# Patient Record
Sex: Female | Born: 1955 | Race: White | Hispanic: No | Marital: Married | Smoking: Never smoker
Health system: Southern US, Community
[De-identification: ages and names within clinical notes are randomized; demographics above are authoritative.]

## PROBLEM LIST (undated history)

## (undated) DIAGNOSIS — K449 Diaphragmatic hernia without obstruction or gangrene: Secondary | ICD-10-CM

## (undated) DIAGNOSIS — K222 Esophageal obstruction: Secondary | ICD-10-CM

## (undated) DIAGNOSIS — Z98891 History of uterine scar from previous surgery: Secondary | ICD-10-CM

## (undated) DIAGNOSIS — M722 Plantar fascial fibromatosis: Secondary | ICD-10-CM

## (undated) DIAGNOSIS — K5730 Diverticulosis of large intestine without perforation or abscess without bleeding: Secondary | ICD-10-CM

## (undated) DIAGNOSIS — Z79890 Hormone replacement therapy (postmenopausal): Secondary | ICD-10-CM

## (undated) DIAGNOSIS — H1130 Conjunctival hemorrhage, unspecified eye: Secondary | ICD-10-CM

## (undated) DIAGNOSIS — Z0000 Encounter for general adult medical examination without abnormal findings: Secondary | ICD-10-CM

## (undated) DIAGNOSIS — K219 Gastro-esophageal reflux disease without esophagitis: Secondary | ICD-10-CM

## (undated) DIAGNOSIS — H6120 Impacted cerumen, unspecified ear: Secondary | ICD-10-CM

## (undated) DIAGNOSIS — R87610 Atypical squamous cells of undetermined significance on cytologic smear of cervix (ASC-US): Secondary | ICD-10-CM

## (undated) DIAGNOSIS — D126 Benign neoplasm of colon, unspecified: Secondary | ICD-10-CM

## (undated) HISTORY — DX: Conjunctival hemorrhage, unspecified eye: H11.30

## (undated) HISTORY — PX: ETHMOIDECTOMY: SHX5197

## (undated) HISTORY — DX: Plantar fascial fibromatosis: M72.2

## (undated) HISTORY — DX: Esophageal obstruction: K22.2

## (undated) HISTORY — DX: Diverticulosis of large intestine without perforation or abscess without bleeding: K57.30

## (undated) HISTORY — DX: Diaphragmatic hernia without obstruction or gangrene: K44.9

## (undated) HISTORY — DX: Impacted cerumen, unspecified ear: H61.20

## (undated) HISTORY — DX: Benign neoplasm of colon, unspecified: D12.6

## (undated) HISTORY — PX: GALLBLADDER SURGERY: SHX652

## (undated) HISTORY — DX: Atypical squamous cells of undetermined significance on cytologic smear of cervix (ASC-US): R87.610

## (undated) HISTORY — DX: Gastro-esophageal reflux disease without esophagitis: K21.9

## (undated) HISTORY — DX: History of uterine scar from previous surgery: Z98.891

## (undated) HISTORY — DX: Hormone replacement therapy: Z79.890

## (undated) HISTORY — DX: Encounter for general adult medical examination without abnormal findings: Z00.00

---

## 1987-01-09 DIAGNOSIS — Z9889 Other specified postprocedural states: Secondary | ICD-10-CM

## 1987-01-09 HISTORY — PX: ETHMOIDECTOMY: SHX5197

## 1987-01-09 HISTORY — DX: Other specified postprocedural states: Z98.890

## 1998-08-09 LAB — FECAL OCCULT BLOOD, GUAIAC: Fecal Occult Blood: NEGATIVE

## 1998-08-24 ENCOUNTER — Other Ambulatory Visit: Admission: RE | Admit: 1998-08-24 | Discharge: 1998-08-24 | Payer: Self-pay | Admitting: Family Medicine

## 1998-09-14 ENCOUNTER — Encounter (INDEPENDENT_AMBULATORY_CARE_PROVIDER_SITE_OTHER): Payer: Self-pay

## 1998-09-14 ENCOUNTER — Other Ambulatory Visit: Admission: RE | Admit: 1998-09-14 | Discharge: 1998-09-14 | Payer: Self-pay | Admitting: Obstetrics & Gynecology

## 1999-08-16 ENCOUNTER — Other Ambulatory Visit: Admission: RE | Admit: 1999-08-16 | Discharge: 1999-08-16 | Payer: Self-pay | Admitting: Obstetrics & Gynecology

## 2001-02-06 ENCOUNTER — Other Ambulatory Visit: Admission: RE | Admit: 2001-02-06 | Discharge: 2001-02-06 | Payer: Self-pay | Admitting: Family Medicine

## 2002-06-15 ENCOUNTER — Other Ambulatory Visit: Admission: RE | Admit: 2002-06-15 | Discharge: 2002-06-15 | Payer: Self-pay | Admitting: Family Medicine

## 2002-06-23 ENCOUNTER — Encounter: Payer: Self-pay | Admitting: Family Medicine

## 2002-06-23 ENCOUNTER — Ambulatory Visit (HOSPITAL_COMMUNITY): Admission: RE | Admit: 2002-06-23 | Discharge: 2002-06-23 | Payer: Self-pay | Admitting: Family Medicine

## 2003-08-03 ENCOUNTER — Other Ambulatory Visit: Admission: RE | Admit: 2003-08-03 | Discharge: 2003-08-03 | Payer: Self-pay | Admitting: Internal Medicine

## 2004-06-02 ENCOUNTER — Ambulatory Visit: Payer: Self-pay | Admitting: Internal Medicine

## 2004-09-28 ENCOUNTER — Other Ambulatory Visit: Admission: RE | Admit: 2004-09-28 | Discharge: 2004-09-28 | Payer: Self-pay | Admitting: Family Medicine

## 2004-09-28 ENCOUNTER — Other Ambulatory Visit: Admission: RE | Admit: 2004-09-28 | Discharge: 2004-09-28 | Payer: Self-pay | Admitting: Internal Medicine

## 2004-09-28 ENCOUNTER — Ambulatory Visit: Payer: Self-pay | Admitting: Family Medicine

## 2004-09-28 ENCOUNTER — Encounter (INDEPENDENT_AMBULATORY_CARE_PROVIDER_SITE_OTHER): Payer: Self-pay | Admitting: *Deleted

## 2004-10-08 DIAGNOSIS — K219 Gastro-esophageal reflux disease without esophagitis: Secondary | ICD-10-CM | POA: Insufficient documentation

## 2004-10-08 DIAGNOSIS — K222 Esophageal obstruction: Secondary | ICD-10-CM | POA: Insufficient documentation

## 2004-10-08 DIAGNOSIS — K449 Diaphragmatic hernia without obstruction or gangrene: Secondary | ICD-10-CM | POA: Insufficient documentation

## 2004-10-18 ENCOUNTER — Ambulatory Visit: Payer: Self-pay | Admitting: Internal Medicine

## 2004-11-01 ENCOUNTER — Ambulatory Visit: Payer: Self-pay | Admitting: Internal Medicine

## 2004-12-18 ENCOUNTER — Ambulatory Visit: Payer: Self-pay | Admitting: Internal Medicine

## 2005-01-08 DIAGNOSIS — R87610 Atypical squamous cells of undetermined significance on cytologic smear of cervix (ASC-US): Secondary | ICD-10-CM | POA: Insufficient documentation

## 2005-02-19 ENCOUNTER — Encounter (INDEPENDENT_AMBULATORY_CARE_PROVIDER_SITE_OTHER): Payer: Self-pay | Admitting: *Deleted

## 2005-02-19 ENCOUNTER — Other Ambulatory Visit: Admission: RE | Admit: 2005-02-19 | Discharge: 2005-02-19 | Payer: Self-pay | Admitting: Family Medicine

## 2005-02-19 ENCOUNTER — Ambulatory Visit: Payer: Self-pay | Admitting: Family Medicine

## 2005-05-14 ENCOUNTER — Ambulatory Visit: Payer: Self-pay | Admitting: Internal Medicine

## 2005-05-16 ENCOUNTER — Ambulatory Visit: Payer: Self-pay | Admitting: Gastroenterology

## 2005-05-18 ENCOUNTER — Ambulatory Visit: Payer: Self-pay | Admitting: Cardiology

## 2005-05-29 ENCOUNTER — Ambulatory Visit: Payer: Self-pay

## 2005-07-30 ENCOUNTER — Encounter (INDEPENDENT_AMBULATORY_CARE_PROVIDER_SITE_OTHER): Payer: Self-pay | Admitting: Specialist

## 2005-07-30 ENCOUNTER — Ambulatory Visit (HOSPITAL_COMMUNITY): Admission: RE | Admit: 2005-07-30 | Discharge: 2005-07-30 | Payer: Self-pay | Admitting: General Surgery

## 2005-07-30 IMAGING — RF DG CHOLANGIOGRAM OPERATIVE
1 series · 4 of 4 positions shown · non-contrast
Comparison: none

CLINICAL DATA: Cholelithiasis and cholecystitis.
 INTRAOPERATIVE CHOLANGIOGRAM:

[Series 1: run · 4 of 64 frames shown]
[frame 10/64]
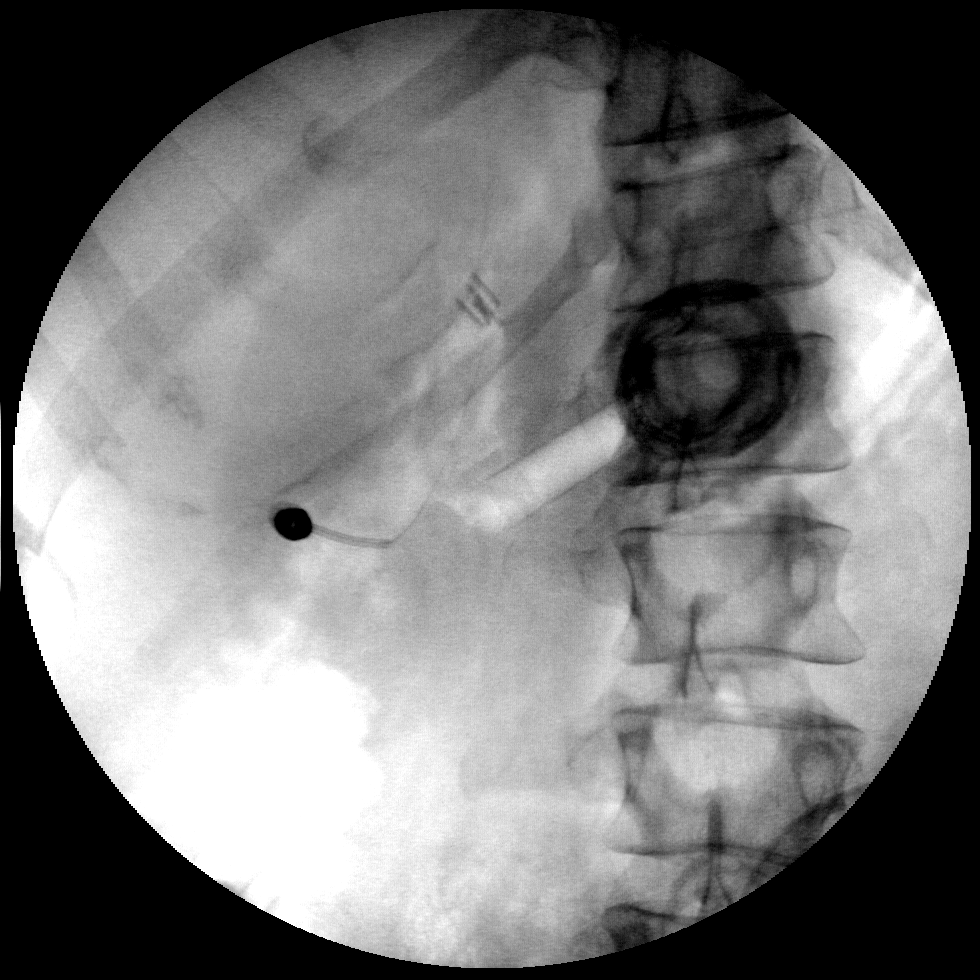
[frame 33/64]
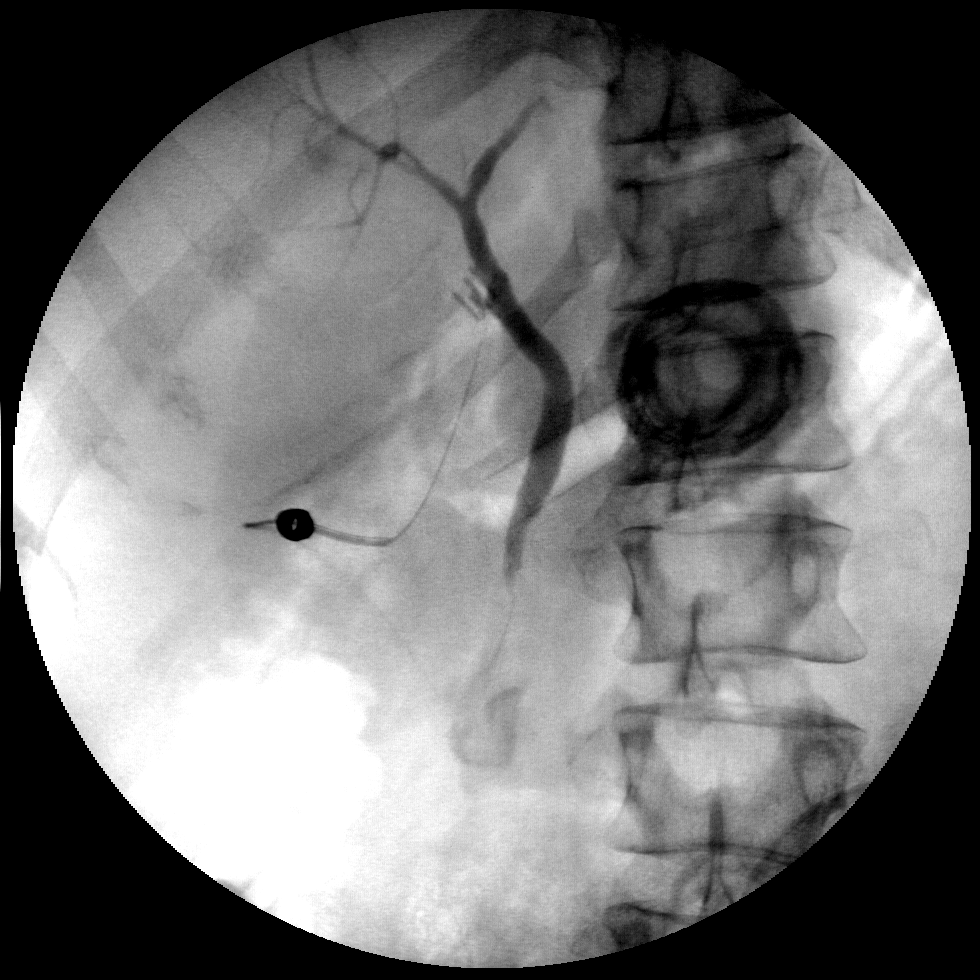
[frame 55/64]
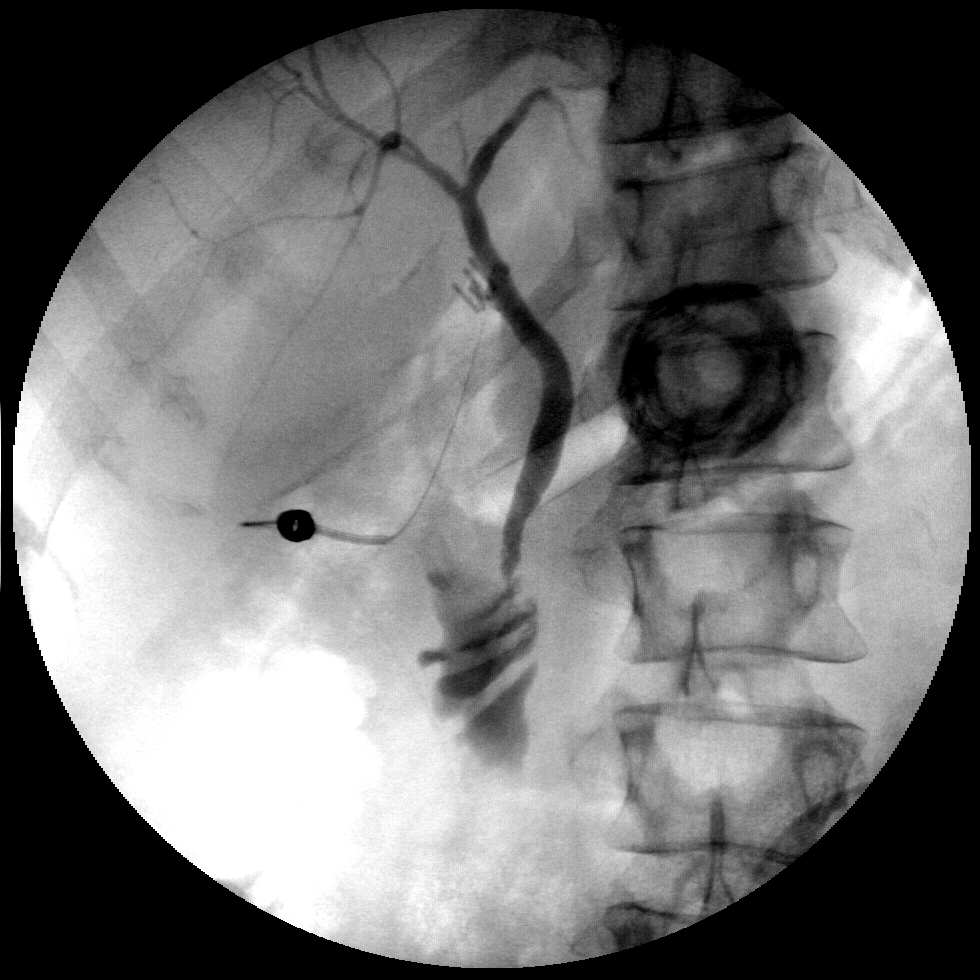
[frame 64/64]
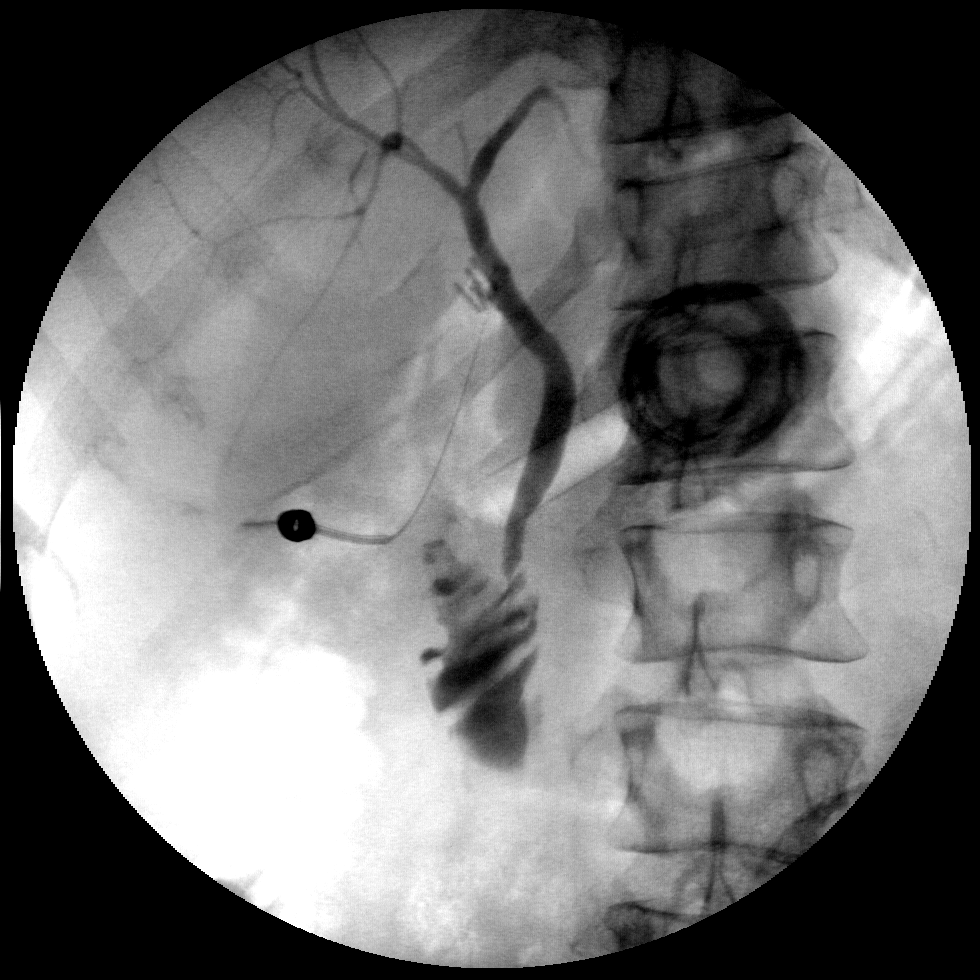

[4 of 4 positions shown; findings below may reference images not displayed]

FINDINGS: Intraoperative cholangiogram demonstrates excellent visualization of the common hepatic and common bile ducts with free flow of contrast into the duodenum.  The intrahepatic ducts appear normal.
IMPRESSION: Normal intraoperative cholangiogram.

## 2005-10-08 ENCOUNTER — Encounter (INDEPENDENT_AMBULATORY_CARE_PROVIDER_SITE_OTHER): Payer: Self-pay | Admitting: Internal Medicine

## 2005-10-16 ENCOUNTER — Encounter (INDEPENDENT_AMBULATORY_CARE_PROVIDER_SITE_OTHER): Payer: Self-pay | Admitting: *Deleted

## 2005-10-16 ENCOUNTER — Other Ambulatory Visit: Admission: RE | Admit: 2005-10-16 | Discharge: 2005-10-16 | Payer: Self-pay | Admitting: Internal Medicine

## 2005-10-16 ENCOUNTER — Ambulatory Visit: Payer: Self-pay | Admitting: Family Medicine

## 2006-10-21 ENCOUNTER — Encounter (INDEPENDENT_AMBULATORY_CARE_PROVIDER_SITE_OTHER): Payer: Self-pay | Admitting: Internal Medicine

## 2006-10-21 ENCOUNTER — Ambulatory Visit: Payer: Self-pay | Admitting: Family Medicine

## 2006-10-21 ENCOUNTER — Other Ambulatory Visit: Admission: RE | Admit: 2006-10-21 | Discharge: 2006-10-21 | Payer: Self-pay | Admitting: Family Medicine

## 2006-10-21 DIAGNOSIS — H6120 Impacted cerumen, unspecified ear: Secondary | ICD-10-CM | POA: Insufficient documentation

## 2006-10-28 ENCOUNTER — Encounter (INDEPENDENT_AMBULATORY_CARE_PROVIDER_SITE_OTHER): Payer: Self-pay | Admitting: *Deleted

## 2006-11-12 ENCOUNTER — Ambulatory Visit: Payer: Self-pay | Admitting: Family Medicine

## 2006-11-15 LAB — CONVERTED CEMR LAB
ALT: 23 units/L (ref 0–35)
AST: 20 units/L (ref 0–37)
Alkaline Phosphatase: 58 units/L (ref 39–117)
BUN: 13 mg/dL (ref 6–23)
Basophils Absolute: 0.1 10*3/uL (ref 0.0–0.1)
CO2: 20 meq/L (ref 19–32)
Calcium: 9.2 mg/dL (ref 8.4–10.5)
Chloride: 107 meq/L (ref 96–112)
Direct LDL: 139.2 mg/dL
Eosinophils Relative: 1.5 % (ref 0.0–5.0)
HCT: 38 % (ref 36.0–46.0)
Hemoglobin: 13.1 g/dL (ref 12.0–15.0)
Lymphocytes Relative: 37.9 % (ref 12.0–46.0)
MCV: 89.8 fL (ref 78.0–100.0)
RDW: 11.8 % (ref 11.5–14.6)
Total Protein: 7.4 g/dL (ref 6.0–8.3)
Triglycerides: 155 mg/dL — ABNORMAL HIGH (ref 0–149)
VLDL: 31 mg/dL (ref 0–40)
WBC: 6.3 10*3/uL (ref 4.5–10.5)

## 2007-02-28 ENCOUNTER — Ambulatory Visit: Payer: Self-pay | Admitting: Internal Medicine

## 2007-03-11 ENCOUNTER — Encounter (INDEPENDENT_AMBULATORY_CARE_PROVIDER_SITE_OTHER): Payer: Self-pay | Admitting: Internal Medicine

## 2007-03-11 ENCOUNTER — Encounter: Payer: Self-pay | Admitting: Internal Medicine

## 2007-03-11 ENCOUNTER — Ambulatory Visit: Payer: Self-pay | Admitting: Internal Medicine

## 2007-03-11 DIAGNOSIS — D126 Benign neoplasm of colon, unspecified: Secondary | ICD-10-CM | POA: Insufficient documentation

## 2007-03-11 DIAGNOSIS — K5730 Diverticulosis of large intestine without perforation or abscess without bleeding: Secondary | ICD-10-CM | POA: Insufficient documentation

## 2007-03-11 LAB — HM COLONOSCOPY: HM Colonoscopy: ABNORMAL

## 2007-03-24 ENCOUNTER — Encounter (INDEPENDENT_AMBULATORY_CARE_PROVIDER_SITE_OTHER): Payer: Self-pay | Admitting: Internal Medicine

## 2007-05-28 ENCOUNTER — Encounter (INDEPENDENT_AMBULATORY_CARE_PROVIDER_SITE_OTHER): Payer: Self-pay | Admitting: Internal Medicine

## 2007-07-03 ENCOUNTER — Telehealth: Payer: Self-pay | Admitting: Internal Medicine

## 2007-07-14 ENCOUNTER — Telehealth (INDEPENDENT_AMBULATORY_CARE_PROVIDER_SITE_OTHER): Payer: Self-pay | Admitting: *Deleted

## 2007-08-01 ENCOUNTER — Encounter: Payer: Self-pay | Admitting: Internal Medicine

## 2007-08-19 ENCOUNTER — Telehealth: Payer: Self-pay | Admitting: Internal Medicine

## 2007-10-01 ENCOUNTER — Ambulatory Visit: Payer: Self-pay | Admitting: Family Medicine

## 2007-10-01 DIAGNOSIS — M722 Plantar fascial fibromatosis: Secondary | ICD-10-CM | POA: Insufficient documentation

## 2007-10-08 ENCOUNTER — Encounter (INDEPENDENT_AMBULATORY_CARE_PROVIDER_SITE_OTHER): Payer: Self-pay | Admitting: Internal Medicine

## 2008-10-28 ENCOUNTER — Encounter: Payer: Self-pay | Admitting: Internal Medicine

## 2008-12-24 ENCOUNTER — Ambulatory Visit: Payer: Self-pay | Admitting: Family Medicine

## 2009-05-24 ENCOUNTER — Encounter: Payer: Self-pay | Admitting: Family Medicine

## 2009-05-26 ENCOUNTER — Encounter: Payer: Self-pay | Admitting: Family Medicine

## 2009-05-26 LAB — CONVERTED CEMR LAB
HDL: 54 mg/dL
LDL Cholesterol: 124 mg/dL

## 2009-07-22 ENCOUNTER — Ambulatory Visit: Payer: Self-pay | Admitting: Family Medicine

## 2009-07-22 DIAGNOSIS — H1130 Conjunctival hemorrhage, unspecified eye: Secondary | ICD-10-CM | POA: Insufficient documentation

## 2009-08-18 ENCOUNTER — Ambulatory Visit: Payer: Self-pay | Admitting: Family Medicine

## 2009-08-18 ENCOUNTER — Other Ambulatory Visit: Admission: RE | Admit: 2009-08-18 | Discharge: 2009-08-18 | Payer: Self-pay | Admitting: Family Medicine

## 2009-08-18 LAB — HM PAP SMEAR

## 2009-08-26 ENCOUNTER — Encounter: Payer: Self-pay | Admitting: Family Medicine

## 2009-08-29 ENCOUNTER — Encounter: Admission: RE | Admit: 2009-08-29 | Discharge: 2009-08-29 | Payer: Self-pay | Admitting: Family Medicine

## 2009-08-29 LAB — HM MAMMOGRAPHY: HM Mammogram: NORMAL

## 2009-08-29 IMAGING — MG MM DIGITAL SCREENING
4 series · 4 of 4 positions shown · non-contrast
Comparison: none

DG SCREEN MAMMOGRAM BILATERAL
Bilateral CC and MLO view(s) were taken.

DIGITAL SCREENING MAMMOGRAM WITH CAD:
The breast tissue is heterogeneously dense.  No masses or malignant type calcifications are 
identified.  Compared with prior studies.
Images were processed with CAD.

[R CC]
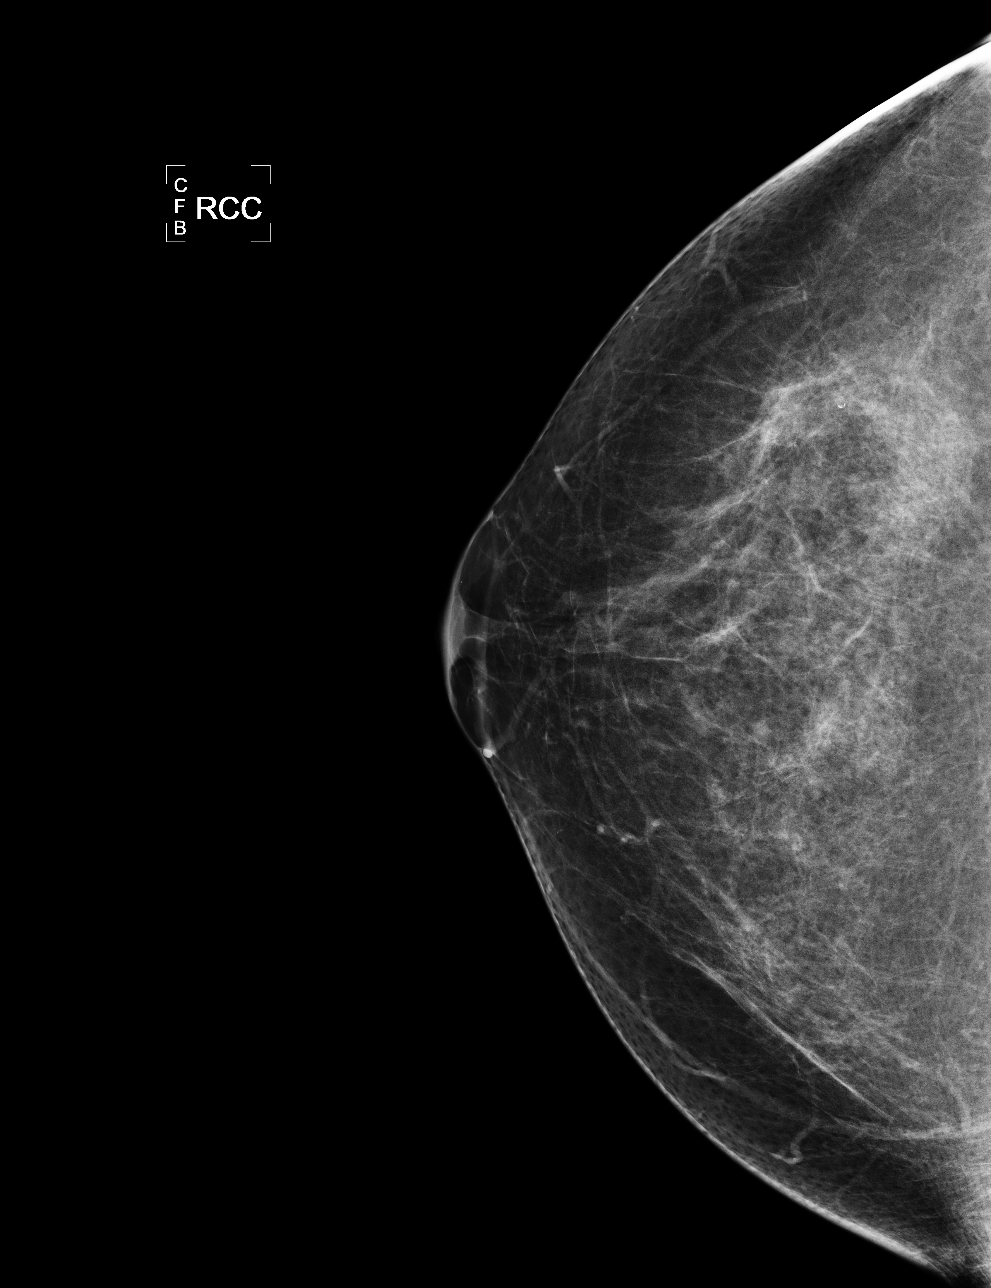

[L CC]
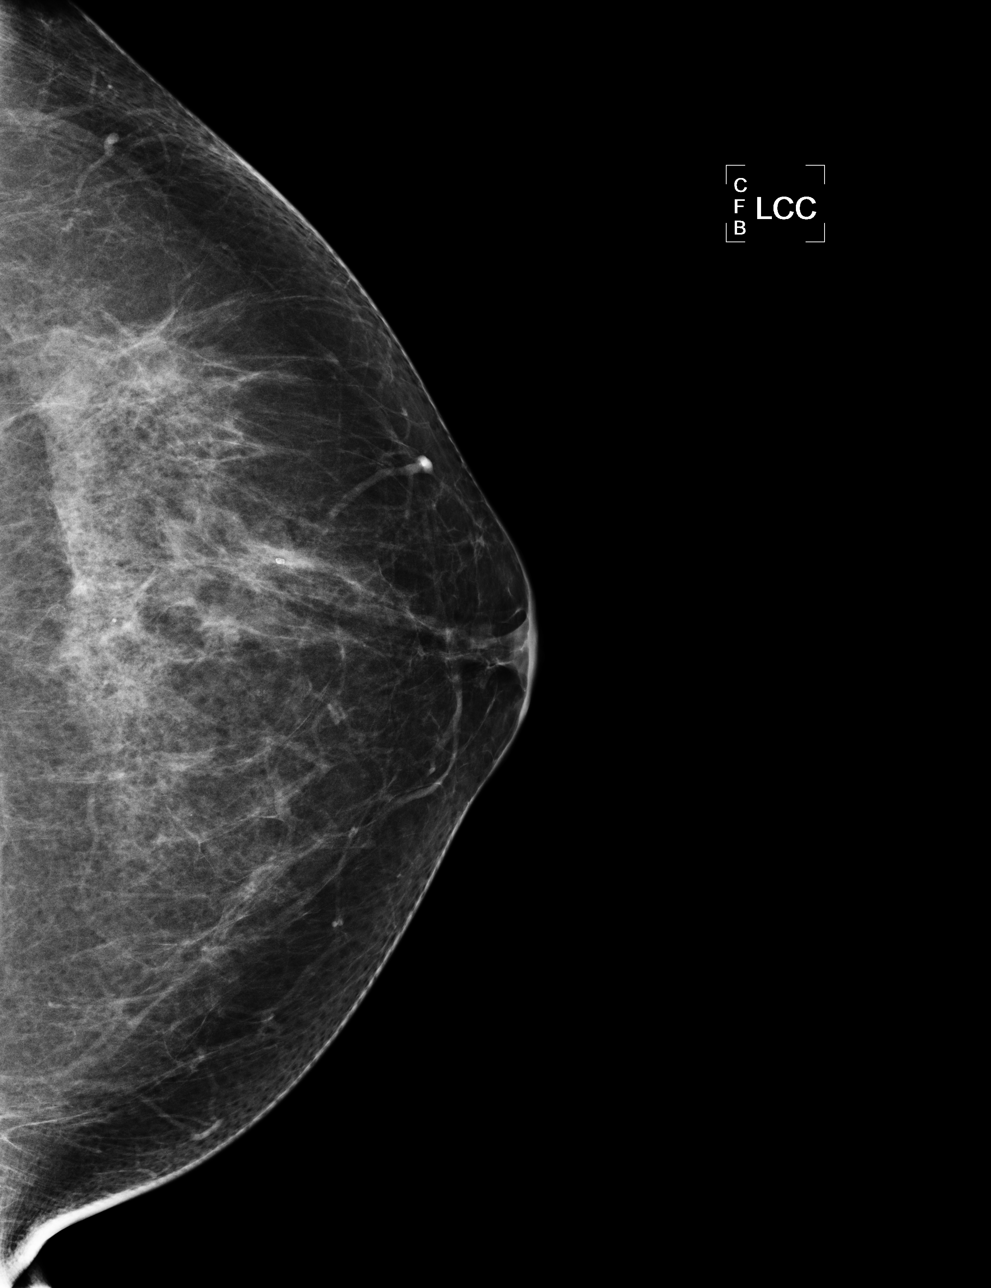

[L MLO]
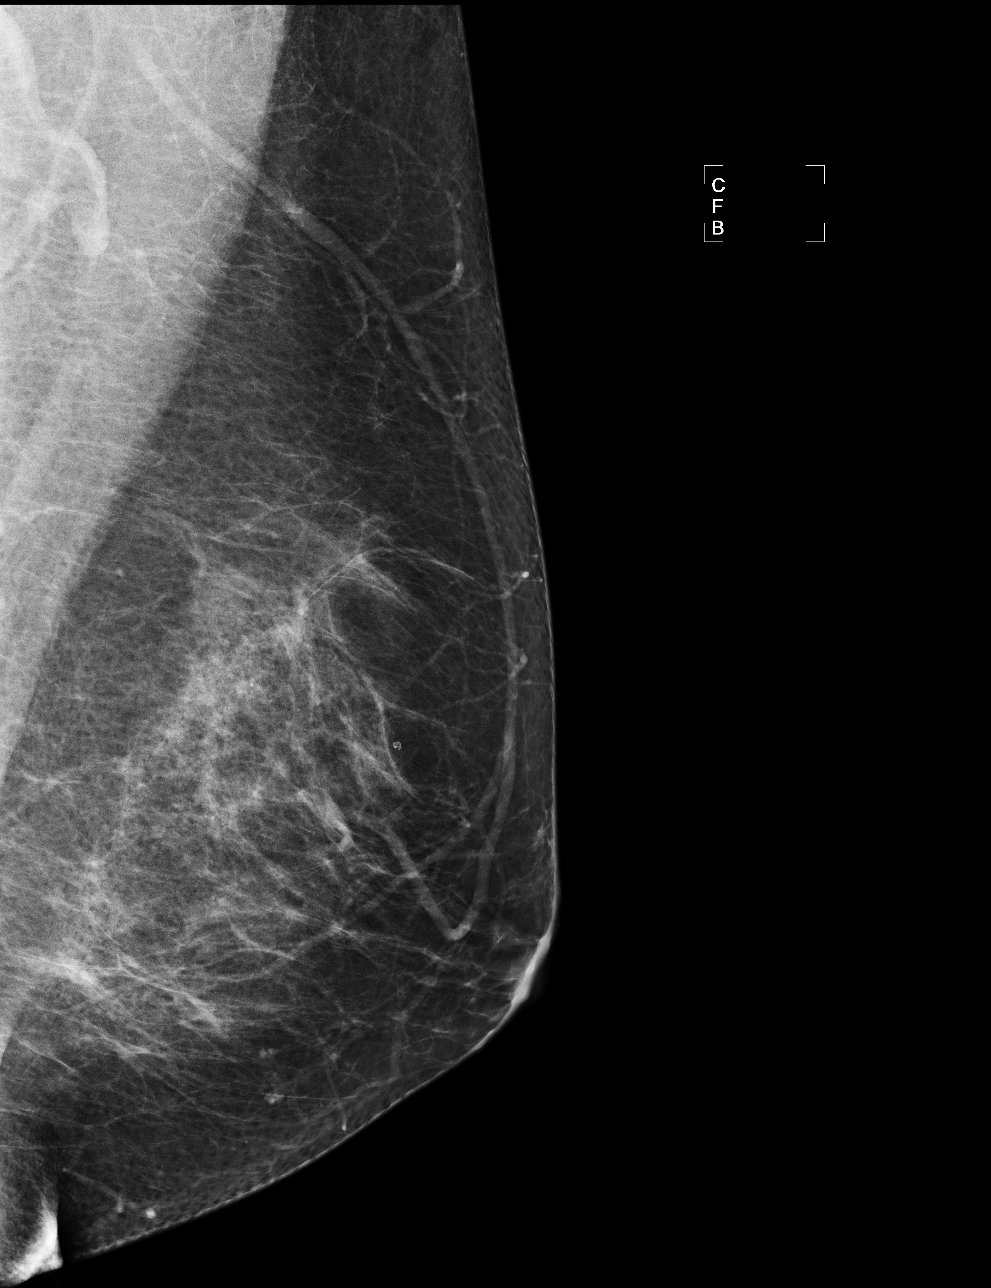

[R MLO]
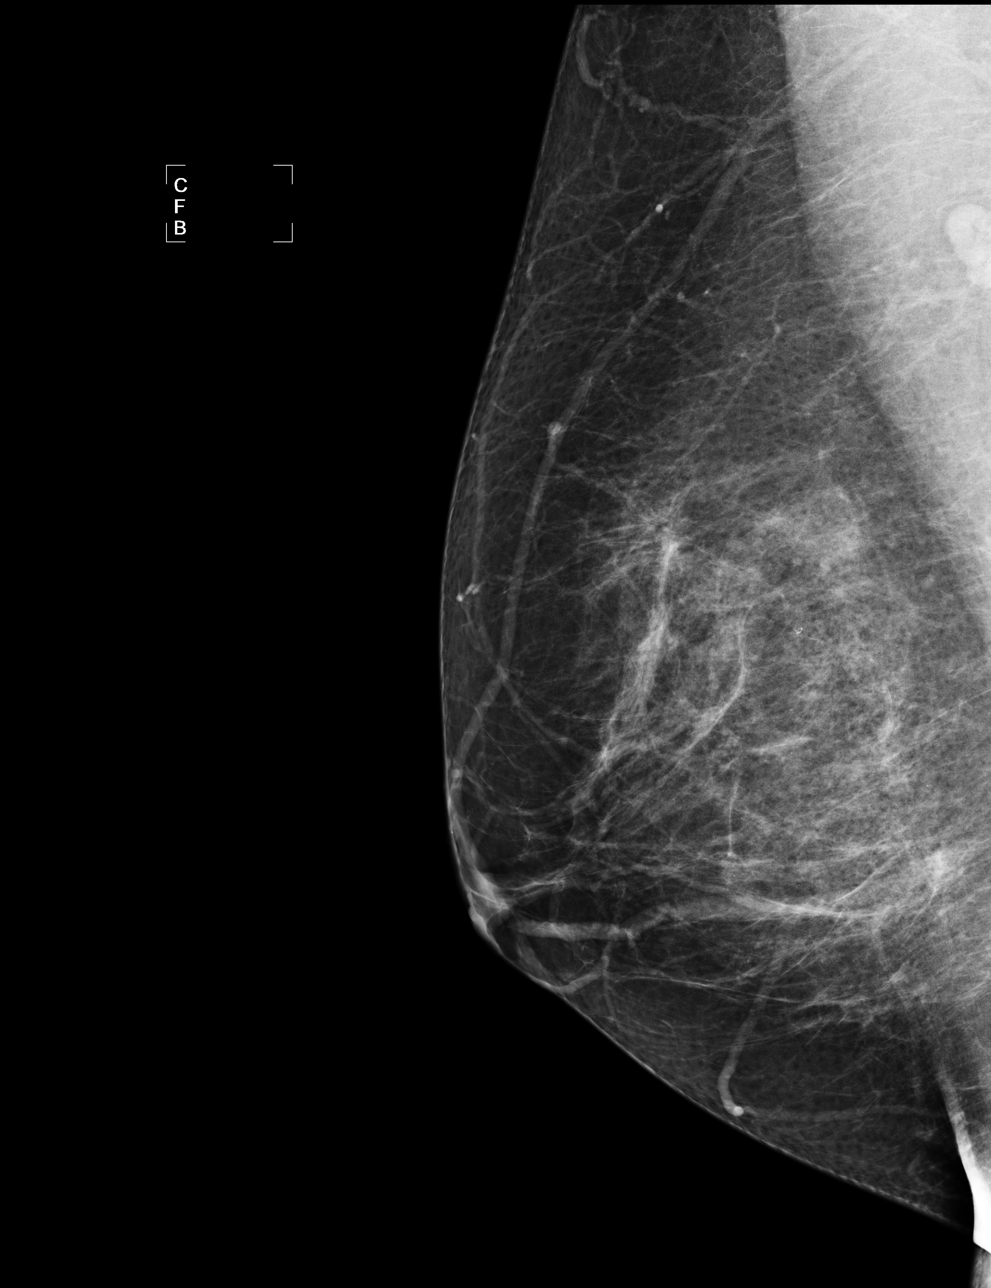

[4 of 4 positions shown; findings below may reference images not displayed]

IMPRESSION: No specific mammographic evidence of malignancy.  Next screening mammogram is recommended in one 
year.

A result letter of this screening mammogram will be mailed directly to the patient.

ASSESSMENT: Negative - BI-RADS 1

Screening mammogram in 1 year.
,

## 2010-02-07 NOTE — Letter (Signed)
Summary: Results Follow up Letter  [place]  [address] [place], [place] [address]   Phone: [phone]  Fax: [phone]    08/26/2009 MRN: [id number]  CHANNEL PAPANDREA [address] [place], [place]  [address]  Dear Ms. Gonterman,  The following are the results of your recent test(s):  Test         Result    Pap Smear:        Normal __X___  Not Normal _____ Comments: ______________________________________________________ Cholesterol: LDL(Bad cholesterol):         Your goal is less than:         HDL (Good cholesterol):       Your goal is more than: Comments:  ______________________________________________________ Mammogram:        Normal _____  Not Normal _____ Comments:  ___________________________________________________________________ Hemoccult:        Normal _____  Not normal _______ Comments:    _____________________________________________________________________ Other Tests:    We routinely do not discuss normal results over the telephone.  If you desire a copy of the results, or you have any questions about this information we can discuss them at your next office visit.   Sincerely,      Ruthe Mannan, MD

## 2010-02-07 NOTE — Letter (Signed)
Summary: [employer]  [employer]   Imported By: Lanelle Bal 06/08/2009 09:57:48  _____________________________________________________________________  External Attachment:    Type:   Image     Comment:   External Document  Appended Document: [employer] Pt needs to establish with a new doctor here.  Appended Document: [employer] I left a message on pt's answering machine to schedule appt.w/ a new doctor.

## 2010-02-07 NOTE — Assessment & Plan Note (Signed)
Summary: INJURY TO LEFT EYE   Vital Signs:  Patient profile:   55 year old female Weight:      169 pounds Temp:     98.8 degrees F oral Pulse rate:   80 / minute Pulse rhythm:   regular BP sitting:   118 / 82  (left arm) Cuff size:   regular  Vitals Entered By: Janee Morn CMA (July 22, 2009 3:25 PM) CC: Left eye injury   History of Present Illness: When cleaning out brush... brach from tree hit eye.  Noted blood on latereal eye.  No vision change.  Occured this AM.  Mild tenderness. No pain with moving eye. No foreign body sensation.  Not on ASA.    Problems Prior to Update: 1)  Plantar Fasciitis, Bilateral  (ICD-728.71) 2)  Ascus Pap  (ICD-795.01) 3)  Esophageal Stricture  (ICD-530.3) 4)  Hiatal Hernia  (ICD-553.3) 5)  Gerd  (ICD-530.81) 6)  Colonic Polyps  (ICD-211.3) 7)  Diverticulosis, Colon  (ICD-562.10) 8)  Hrt  (ICD-V07.4) 9)  Cerumen Impaction, Bilateral  (ICD-380.4) 10)  Well Adult  (ICD-V70.0)  Current Medications (verified): 1)  Omeprazole 20 Mg Cpdr (Omeprazole) .... Take 1 Capsule By Mouth Once A Day 2)  Calcium 600/vitamin D 600-200 Mg-Unit  Tabs (Calcium Carbonate-Vitamin D) .... Take 1 Tablet By Mouth Two Times A Day 3)  Fish Oil 1000 Mg  Caps (Omega-3 Fatty Acids) .... Take 1 Capsule By Mouth Two Times A Day 4)  Miralax  Powd (Polyethylene Glycol 3350) .... As Needed 5)  Claritin-D 12 Hour 5-120 Mg Xr12h-Tab (Loratadine-Pseudoephedrine) .... Otc As Directed. 6)  Ibuprofen 200 Mg Tabs (Ibuprofen) .... Otc As Directed. 7)  Afrin Nasal Spray 0.05 % Soln (Oxymetazoline Hcl) .... Otc As Directed. 8)  Etodolac 400 Mg Tabs (Etodolac) .Marland Kitchen.. 1 By Mouth Twice A Day  Allergies: 1)  ! Ceclor  Past History:  Past medical, surgical, family and social histories (including risk factors) reviewed, and no changes noted (except as noted below).  Past Medical History: Reviewed history from 05/28/2007 and no changes required. Diverticulosis, colon --8/02 GERD  (10/08/2004), esophageal stricture, Hiatal hernia  Past Surgical History: Reviewed history from 05/28/2007 and no changes required. colonoscopy--abn-- 8/02---03/11/07 C section x 2 ethmoidectomy colposcopy 3/07 ASCUS EGD positive 10/06 stress cardiolyte neg 12/02 / 5/07 neg  Family History: Reviewed history from 05/28/2007 and no changes required.      colon CA mother, m uncle diabetes father, PGM      Social History: Reviewed history from 05/28/2007 and no changes required. Marital Status: Married Children:  Occupation:   Review of Systems General:  Denies fatigue and fever. CV:  Denies chest pain or discomfort. Resp:  Denies shortness of breath.  Physical Exam  General:  Well-developed,well-nourished,in no acute distress; alert,appropriate and cooperative throughout examination Eyes:  left conjunctiva with hemmorhage, full ROM, PERRLA, no blood behind lens. Ears:  External ear exam shows no significant lesions or deformities.  Otoscopic examination reveals clear canals, tympanic membranes are intact bilaterally without bulging, retraction, inflammation or discharge. Hearing is grossly normal bilaterally. Nose:  External nasal examination shows no deformity or inflammation. Nasal mucosa are pink and moist without lesions or exudates. Mouth:  Oral mucosa and oropharynx without lesions or exudates.  Teeth in good repair. Neck:  no carotid bruit or thyromegaly .nodes Lungs:  Normal respiratory effort, chest expands symmetrically. Lungs are clear to auscultation, no crackles or wheezes. Heart:  Normal rate and regular rhythm. S1 and S2 normal  without gallop, murmur, click, rub or other extra sounds.   Impression & Recommendations:  Problem # 1:  SUBCONJUNCTIVAL HEMORRHAGE, LEFT (ICD-372.72) Pt reasssured. Will resolve with time.   Complete Medication List: 1)  Omeprazole 20 Mg Cpdr (Omeprazole) .... Take 1 capsule by mouth once a day 2)  Calcium 600/vitamin D 600-200  Mg-unit Tabs (Calcium carbonate-vitamin d) .... Take 1 tablet by mouth two times a day 3)  Fish Oil 1000 Mg Caps (Omega-3 fatty acids) .... Take 1 capsule by mouth two times a day 4)  Miralax Powd (Polyethylene glycol 3350) .... As needed 5)  Claritin-d 12 Hour 5-120 Mg Xr12h-tab (Loratadine-pseudoephedrine) .... Otc as directed. 6)  Ibuprofen 200 Mg Tabs (Ibuprofen) .... Otc as directed. 7)  Afrin Nasal Spray 0.05 % Soln (Oxymetazoline hcl) .... Otc as directed. 8)  Etodolac 400 Mg Tabs (Etodolac) .Marland Kitchen.. 1 by mouth twice a day  Current Allergies (reviewed today): ! CECLOR

## 2010-02-07 NOTE — Assessment & Plan Note (Signed)
Summary: TRANSFER FROM [facility]/30 MIN/CLE   Vital Signs:  Patient profile:   55 year old female Height:      65 inches Weight:      163.13 pounds BMI:     27.24 Temp:     98.3 degrees F oral Pulse rate:   72 / minute Pulse rhythm:   regular BP sitting:   110 / 70  (left arm) Cuff size:   regular  Vitals Entered By: Linde Gillis CMA Duncan Dull) (August 18, 2009 9:44 AM) CC: transfer from [place]   History of Present Illness: 55 yo here to establish care from [place].  No complaints, has not had a CPX in years. Due for pap, mammogram.  Seeing Dr. Sharrell Ku for weight loss.  Had labs done, including CBC, hepatic panel, alc, Lipid panel, TSH. Brings them with her today.  All within normal limits. Has lost 12 pounds on his plan--diet and exercise, no medications.    Current Medications (verified): 1)  Omeprazole 20 Mg Cpdr (Omeprazole) .... Take 1 Capsule By Mouth Once A Day 2)  Claritin-D 12 Hour 5-120 Mg Xr12h-Tab (Loratadine-Pseudoephedrine) .... Otc As Directed.  Allergies: 1)  ! Ceclor  Past History:  Past Medical History: Last updated: 05/28/2007 Diverticulosis, colon --8/02 GERD (10/08/2004), esophageal stricture, Hiatal hernia  Past Surgical History: Last updated: 05/28/2007 colonoscopy--abn-- 8/02---03/11/07 C section x 2 ethmoidectomy colposcopy 3/07 ASCUS EGD positive 10/06 stress cardiolyte neg 12/02 / 5/07 neg  Family History: Last updated: 05/28/2007      colon CA mother, m uncle diabetes father, PGM      Social History: Last updated: 05/28/2007 Marital Status: Married Children:  Occupation:   Risk Factors: Smoking Status: never (05/28/2007)  Review of Systems      See HPI General:  Denies malaise. Eyes:  Denies blurring. ENT:  Denies difficulty swallowing. CV:  Denies chest pain or discomfort. Resp:  Denies shortness of breath. GI:  Denies abdominal pain, bloody stools, and change in bowel habits. GU:  Denies abnormal vaginal  bleeding, decreased libido, discharge, and dysuria. MS:  Denies joint pain, joint redness, joint swelling, and loss of strength. Derm:  Denies rash. Neuro:  Denies visual disturbances and weakness. Psych:  Denies anxiety and depression. Endo:  Denies cold intolerance and heat intolerance. Heme:  Denies abnormal bruising and enlarge lymph nodes.  Physical Exam  General:  Well-developed,well-nourished,in no acute distress; alert,appropriate and cooperative throughout examination Head:  normocephalic and atraumatic.   Eyes:  vision grossly intact, pupils equal, pupils round, and pupils reactive to light.   Ears:  R ear normal and L ear normal.   Nose:  no external deformity.   Mouth:  good dentition and no gingival abnormalities.   Neck:  No deformities, masses, or tenderness noted. Breasts:  No mass, nodules, thickening, tenderness, bulging, retraction, inflamation, nipple discharge or skin changes noted.   Lungs:  Normal respiratory effort, chest expands symmetrically. Lungs are clear to auscultation, no crackles or wheezes. Heart:  Normal rate and regular rhythm. S1 and S2 normal without gallop, murmur, click, rub or other extra sounds. Abdomen:  Bowel sounds positive,abdomen soft and non-tender without masses, organomegaly or hernias noted. Rectal:  no external abnormalities and no hemorrhoids.   Genitalia:  Pelvic Exam:        External: normal female genitalia without lesions or masses        Vagina: normal without lesions or masses        Cervix: normal without lesions or masses  Adnexa: normal bimanual exam without masses or fullness        Uterus: normal by palpation        Pap smear: performed Msk:  No deformity or scoliosis noted of thoracic or lumbar spine.   Extremities:  no edema Neurologic:  alert & oriented X3 and gait normal.   Skin:  Intact without suspicious lesions or rashes Psych:  Cognition and judgment appear intact. Alert and cooperative with normal  attention span and concentration. No apparent delusions, illusions, hallucinations   Complete Medication List: 1)  Omeprazole 20 Mg Cpdr (Omeprazole) .... Take 1 capsule by mouth once a day 2)  Claritin-d 12 Hour 5-120 Mg Xr12h-tab (Loratadine-pseudoephedrine) .... Otc as directed.  Other Orders: Radiology Referral (Radiology)  Patient Instructions: 1)  Great to meet you. 2)  Please stop by to see Shirlee Limerick on your way out to set up your mammogram.  Current Allergies (reviewed today): ! CECLOR  Last HDL:  46.9 (11/12/2006 10:08:00 AM) HDL Result Date:  05/26/2009 HDL Result:  54 Last LDL:  DEL (11/12/2006 10:08:00 AM) LDL Result Date:  05/26/2009 LDL Result:  124   Prevention & Chronic Care Immunizations   Influenza vaccine: Not documented    Tetanus booster: Not documented    Pneumococcal vaccine: Not documented  Colorectal Screening   Hemoccult: Negative  (08/09/1998)   Hemoccult due: 08/09/1999    Colonoscopy: abnormal  (03/11/2007)   Colonoscopy due: 03/10/2017  Other Screening   Pap smear: Normal  (10/08/2005)   Pap smear action/deferral: Ordered  (08/18/2009)   Pap smear due: 10/09/2006    Mammogram: Normal  (06/25/2002)   Mammogram action/deferral: Ordered  (08/18/2009)   Mammogram due: 06/25/2003   Smoking status: never  (05/28/2007)  Lipids   Total Cholesterol: 202  (11/12/2006)   LDL: 124  (05/26/2009)   LDL Direct: 139.2  (11/12/2006)   HDL: 54  (05/26/2009)   Triglycerides: 155  (11/12/2006)   Nursing Instructions: Pap smear today Schedule screening mammogram (see order)

## 2010-05-26 NOTE — Op Note (Signed)
NAMEJEAN, Penny Gardner           ACCOUNT NO.:  [id number]   MEDICAL RECORD NO.:  [id number]          PATIENT TYPE:  AMB   LOCATION:  DAY                          FACILITY:  [facility]   PHYSICIAN:  Ollen Gross. Vernell Morgans, M.D. DATE OF BIRTH:  Feb 01, 1955   DATE OF PROCEDURE:  07/30/2005  DATE OF DISCHARGE:                                 OPERATIVE REPORT   PREOPERATIVE DIAGNOSIS:  Cholecystitis with cholelithiasis.   POSTOPERATIVE DIAGNOSIS:  Gallstones.   PROCEDURE:  Laparoscopic cholecystectomy with intraoperative cholangiogram.   SURGEON:  Dr. Carolynne Edouard.   ASSISTANT:  Dr. Lurene Shadow.   ANESTHESIA:  General endotracheal.   PROCEDURE:  After informed consent was obtained, the patient was brought to  the operating room and placed in the supine position on the operating room  table.  After adequate induction of general anesthesia, the patient's  abdomen was prepped with Betadine and draped in usual sterile manner.  The  area below the umbilicus was infiltrated with 0.5% Marcaine.  A small  incision was made with 15-blade knife.  This incision was carried down  through the subcutaneous tissue bluntly with hemostat and [employer]  retractors until the linea alba was identified.  The linea alba was incised  with a 15-blade knife and each side grasped with Kocher clamps and elevated  anteriorly.  The pre-peritoneal space was then probed bluntly with a  hemostat until the peritoneum was opened and access was gained to the  abdominal cavity.  A 0 Vicryl pursestring stitch was placed in the fascia  around the opening.  The Hasson cannula was placed through the opening and  anchored in place with the previously placed Vicryl pursestring stitch.  The  abdomen was then insufflated with carbon dioxide without difficulty.  The  patient was placed in a head-up position, rotated slightly right side up and  laparoscope was inserted through the Hasson cannula.  The right upper  quadrant was inspected.  The dome  of the gallbladder and liver were readily  identified.  Next, the epigastric region was infiltrated with 0.5% Marcaine.  A small incision was made with the 15-blade knife.  A 10-mm port was then  placed bluntly through this incision into the abdominal cavity under direct  vision.  Sites were then chosen laterally on the right side of the abdomen  for placement of 5-mm ports.  Each of these areas was infiltrated with 0.5%  Marcaine.  Small stab incision was made with 15-blade knife.  Five mm ports  were then placed bluntly through these incisions into the abdominal cavity  under direct vision.  Blunt grasper was then placed through the latter most  5-mm port and used to grasp the dome of he gallbladder and elevated  anteriorly and superiorly.  Another blunt grasper was placed through the  other 5-mm port and used to retract on the body and neck of the gallbladder.  A dissector was placed through the epigastric port and using the  electrocautery the peritoneal reflection, the gallbladder neck was opened  and blunt dissection was then carried out in this area until the gallbladder  neck and cystic  duct junction was readily identified and a good window was  created.  A single clip was placed on the gallbladder neck.  A small  ductotomy was made just below the clip.  A 14-gauge Angiocath was placed  percutaneously through the anterior abdominal wall under direct vision.  A  Reddick cholangiogram catheter was placed through the Angiocath and flushed.  The Reddick catheter was then placed within the cystic duct and anchored in  place with a clip.  The cholangiogram was obtained.  It showed no filling  defects, good emptying in the duodenum.  The cystic duct was short but there  was adequate length.  The anchoring clip and catheter was then removed from  the patient.  Three clips were placed proximally on the cystic duct and duct  was divided between the two sets of clips.  Posterior to this the  cystic  artery was identified and again dissected bluntly in a circumferential  manner until a good window was created.  Two clips placed proximally and one  distally on the artery and the artery was divided between the two.  Next,  laparoscopic cautery device was used to separate the gallbladder from the  bed.  There were a couple of small vessels on the liver bed that were  controlled with clips. Prior to completely detaching the gallbladder from  the liver bed, the liver bed was inspected.  Several small bleeding points  were coagulated with the electrocautery until the area was completely  hemostatic.  The gallbladder was then detached the rest of the way from the  liver bed without difficulty.  The abdomen was then irrigated with copious  amounts of saline until the effluent was clear.  The laparoscope was then  moved to the epigastric port.  The gallbladder grasper was placed through  the Hasson cannula and used to grasp the neck of the gallbladder.  The  gallbladder with the Hasson cannula was then removed through the  infraumbilical port without difficulty.  The fascial defect was closed with  the previously placed Vicryl pursestring stitches and with another  interrupted figure-of-eight 0 Vicryl stitch.  The rest of the ports were  removed under direct vision.  Gas was allowed to escape.  Skin incisions  were all closed with interrupted 4-0 Monocryl subcuticular stitches.  Benzoin and Steri-Strips and sterile dressings were applied.  The patient  tolerated the procedure well.  At the end of the case, all needle, sponge  and instruments counts were correct.  The patient was then awakened and  taken to recovery in stable condition.      Ollen Gross. Vernell Morgans, M.D.  Electronically Signed     PST/MEDQ  D:  07/30/2005  T:  07/30/2005  Job:  [id number]

## 2010-06-22 LAB — HM COLONOSCOPY

## 2011-08-01 ENCOUNTER — Encounter: Payer: Self-pay | Admitting: Family Medicine

## 2011-08-02 ENCOUNTER — Ambulatory Visit (INDEPENDENT_AMBULATORY_CARE_PROVIDER_SITE_OTHER): Payer: BC Managed Care – PPO | Admitting: Family Medicine

## 2011-08-02 ENCOUNTER — Encounter: Payer: Self-pay | Admitting: Family Medicine

## 2011-08-02 VITALS — BP 120/88 | HR 88 | Temp 98.2°F | Ht 65.0 in | Wt 158.0 lb

## 2011-08-02 DIAGNOSIS — F40243 Fear of flying: Secondary | ICD-10-CM | POA: Insufficient documentation

## 2011-08-02 DIAGNOSIS — F40298 Other specified phobia: Secondary | ICD-10-CM

## 2011-08-02 DIAGNOSIS — H6120 Impacted cerumen, unspecified ear: Secondary | ICD-10-CM

## 2011-08-02 DIAGNOSIS — Z0000 Encounter for general adult medical examination without abnormal findings: Secondary | ICD-10-CM

## 2011-08-02 DIAGNOSIS — Z1211 Encounter for screening for malignant neoplasm of colon: Secondary | ICD-10-CM

## 2011-08-02 DIAGNOSIS — D126 Benign neoplasm of colon, unspecified: Secondary | ICD-10-CM

## 2011-08-02 DIAGNOSIS — Z1231 Encounter for screening mammogram for malignant neoplasm of breast: Secondary | ICD-10-CM

## 2011-08-02 DIAGNOSIS — Z136 Encounter for screening for cardiovascular disorders: Secondary | ICD-10-CM

## 2011-08-02 DIAGNOSIS — R5383 Other fatigue: Secondary | ICD-10-CM

## 2011-08-02 DIAGNOSIS — Z23 Encounter for immunization: Secondary | ICD-10-CM

## 2011-08-02 LAB — LIPID PANEL
HDL: 74.5 mg/dL (ref >39.00)
Total CHOL/HDL Ratio: 3
VLDL: 14.4 mg/dL (ref 0.0–40.0)

## 2011-08-02 LAB — COMPREHENSIVE METABOLIC PANEL
ALT: 29 U/L (ref 0–35)
AST: 26 U/L (ref 0–37)
Alkaline Phosphatase: 79 U/L (ref 39–117)
Creatinine, Ser: 0.8 mg/dL (ref 0.4–1.2)
Sodium: 138 mEq/L (ref 135–145)
Total Bilirubin: 0.6 mg/dL (ref 0.3–1.2)
Total Protein: 8.1 g/dL (ref 6.0–8.3)

## 2011-08-02 LAB — CBC WITH DIFFERENTIAL/PLATELET
Basophils Absolute: 0 10*3/uL (ref 0.0–0.1)
Eosinophils Absolute: 0.2 10*3/uL (ref 0.0–0.7)
HCT: 42 % (ref 36.0–46.0)
Hemoglobin: 14.1 g/dL (ref 12.0–15.0)
Lymphs Abs: 2.9 10*3/uL (ref 0.7–4.0)
MCHC: 33.5 g/dL (ref 30.0–36.0)
MCV: 91.9 fl (ref 78.0–100.0)
Monocytes Absolute: 0.5 10*3/uL (ref 0.1–1.0)
Monocytes Relative: 6.7 % (ref 3.0–12.0)
Neutro Abs: 3.3 10*3/uL (ref 1.4–7.7)
Platelets: 209 10*3/uL (ref 150.0–400.0)
RDW: 12.3 % (ref 11.5–14.6)

## 2011-08-02 LAB — LDL CHOLESTEROL, DIRECT: Direct LDL: 153.2 mg/dL

## 2011-08-02 MED ORDER — ALPRAZOLAM 0.25 MG PO TABS: ORAL_TABLET | ORAL | Status: DC

## 2011-08-02 NOTE — Progress Notes (Signed)
Subjective:    Patient ID: Penny Gardner, female    DOB: July 05, 1955, 56 y.o.   MRN: [id number]  HPI  56 yo here for CPX. Obesity- seeing Dr. Kinnie Scales.  On phentermine, limiting to 1200 calories a day.  Has lost over 30 pounds and feels great.  Due for mammogram, Tdap.    Had colonoscopy last year (h/o colon polyps).  Going to [place] to visit her son on Monday!  Has only been on a plane once and very anxious about the long flight.  Patient Active Problem List  Diagnosis  . COLONIC POLYPS  . SUBCONJUNCTIVAL HEMORRHAGE, LEFT  . CERUMEN IMPACTION, BILATERAL  . ESOPHAGEAL STRICTURE  . GERD  . HIATAL HERNIA  . DIVERTICULOSIS, COLON  . PLANTAR FASCIITIS, BILATERAL  . ASCUS PAP  . Routine general medical examination at a health care facility  . Fear of flying  . Cerumen impaction   Past Medical History  Diagnosis Date  . Papanicolaou smear of cervix with atypical squamous cells of undetermined significance (ASC-US)     colposcopy 3/07, ASCUS  . Impacted cerumen   . Benign neoplasm of colon   . Diverticulosis of colon (without mention of hemorrhage)     colonoscopy 8/02, 03/11/07 abnormal  . Stricture and stenosis of esophagus   . Esophageal reflux     EGD positive 10/06  . Hiatal hernia   . Postmenopausal HRT (hormone replacement therapy)   . Plantar fascial fibromatosis   . Conjunctival hemorrhage   . Routine general medical examination at a health care facility    Past Surgical History  Procedure Date  . Cesarean section     x 2  . Ethmoidectomy    History  Substance Use Topics  . Smoking status: Never Smoker   . Smokeless tobacco: Not on file  . Alcohol Use: Not on file   Family History  Problem Relation Age of Onset  . Cancer Mother     colon  . Diabetes Father   . Cancer Maternal Uncle     colon  . Diabetes Paternal Grandmother    Allergies  Allergen Reactions  . Cefaclor     REACTION: itching   Current Outpatient Prescriptions on File Prior  to Visit  Medication Sig Dispense Refill  . loratadine (CLARITIN) 10 MG tablet Take 10 mg by mouth daily.      Marland Kitchen omeprazole (PRILOSEC) 20 MG capsule Take 20 mg by mouth daily.      . phentermine 15 MG capsule Take one half tablet by mouth daily       The PMH, PSH, Social History, Family History, Medications, and allergies have been reviewed in [facility], and have been updated if relevant.   Review of Systems See HPI Patient reports no  vision/ hearing changes,anorexia, weight change, fever ,adenopathy, persistant / recurrent hoarseness, swallowing issues, chest pain, edema,persistant / recurrent cough, hemoptysis, dyspnea(rest, exertional, paroxysmal nocturnal), gastrointestinal  bleeding (melena, rectal bleeding), abdominal pain, excessive heart burn, GU symptoms(dysuria, hematuria, pyuria, voiding/incontinence  Issues) syncope, focal weakness, severe memory loss, concerning skin lesions, depression, anxiety, abnormal bruising/bleeding, major joint swelling, breast masses or abnormal vaginal bleeding.       Objective:   Physical Exam  BP 120/88  Pulse 88  Temp 98.2 F (36.8 C)  Ht 5\' 5"  (1.651 m)  Wt 158 lb (71.668 kg)  BMI 26.29 kg/m2 Wt Readings from Last 3 Encounters:  08/02/11 158 lb (71.668 kg)  08/18/09 163 lb 2.1 oz (73.996 kg)  07/22/09  169 lb (76.658 kg)   General:  Well-developed,well-nourished,in no acute distress; alert,appropriate and cooperative throughout examination Head:  normocephalic and atraumatic.   Eyes:  vision grossly intact, pupils equal, pupils round, and pupils reactive to light.   Ears:  Cerumen impaction bilaterally Nose:  no external deformity.   Mouth:  good dentition.   Neck:  No deformities, masses, or tenderness noted. Breasts:  No mass, nodules, thickening, tenderness, bulging, retraction, inflamation, nipple discharge or skin changes noted.   Lungs:  Normal respiratory effort, chest expands symmetrically. Lungs are clear to auscultation, no crackles  or wheezes. Heart:  Normal rate and regular rhythm. S1 and S2 normal without gallop, murmur, click, rub or other extra sounds. Abdomen:  Bowel sounds positive,abdomen soft and non-tender without masses, organomegaly or hernias noted. Msk:  No deformity or scoliosis noted of thoracic or lumbar spine.   Extremities:  No clubbing, cyanosis, edema, or deformity noted with normal full range of motion of all joints.   Neurologic:  alert & oriented X3 and gait normal.   Skin:  Intact without suspicious lesions or rashes Psych:  Cognition and judgment appear intact. Alert and cooperative with normal attention span and concentration. No apparent delusions, illusions, hallucinations      Assessment & Plan:   1. Other screening mammogram  MM Digital Screening  2. Routine general medical examination at a health care facility  Reviewed preventive care protocols, scheduled due services, and updated immunizations Discussed nutrition, exercise, diet, and healthy lifestyle.   CBC with Differential, Comprehensive metabolic panel  3. Screening for ischemic heart disease  Lipid Panel  4. Screening for colon cancer  Fecal occult blood, imunochemical  5. Fear of flying  Rx given for xanax.  Discussed sedation precautions.   6. Cerumen impaction  Ceruminosis is noted.  Wax is removed by syringing and manual debridement. Instructions for home care to prevent wax buildup are given.

## 2011-08-02 NOTE — Addendum Note (Signed)
Addended by: Eliezer Bottom on: 08/02/2011 09:48 AM   Modules accepted: Orders

## 2011-08-02 NOTE — Patient Instructions (Addendum)
Great to see you. Please stop by to see Shirlee Limerick on your way out to set up mammogram.

## 2011-08-14 ENCOUNTER — Encounter: Payer: Self-pay | Admitting: *Deleted

## 2011-08-14 ENCOUNTER — Other Ambulatory Visit: Payer: BC Managed Care – PPO

## 2011-08-14 ENCOUNTER — Encounter: Payer: Self-pay | Admitting: Family Medicine

## 2011-08-14 DIAGNOSIS — Z1211 Encounter for screening for malignant neoplasm of colon: Secondary | ICD-10-CM

## 2011-08-14 LAB — FECAL OCCULT BLOOD, IMMUNOCHEMICAL: Fecal Occult Bld: NEGATIVE

## 2011-09-03 ENCOUNTER — Ambulatory Visit: Payer: BC Managed Care – PPO

## 2011-09-04 ENCOUNTER — Ambulatory Visit
Admission: RE | Admit: 2011-09-04 | Discharge: 2011-09-04 | Disposition: A | Discharge status: Home / Self Care | Payer: BC Managed Care – PPO | Source: Ambulatory Visit | Attending: Family Medicine | Admitting: Family Medicine

## 2011-09-04 DIAGNOSIS — Z1231 Encounter for screening mammogram for malignant neoplasm of breast: Secondary | ICD-10-CM

## 2011-09-05 ENCOUNTER — Encounter: Payer: Self-pay | Admitting: *Deleted

## 2011-09-05 IMAGING — MG MM DIGITAL SCREENING BILAT
4 series · 4 of 4 positions shown · non-contrast
Comparison: Previous exams.

CLINICAL DATA: Screening.

DIGITAL BILATERAL SCREENING MAMMOGRAM WITH CAD

[R CC]
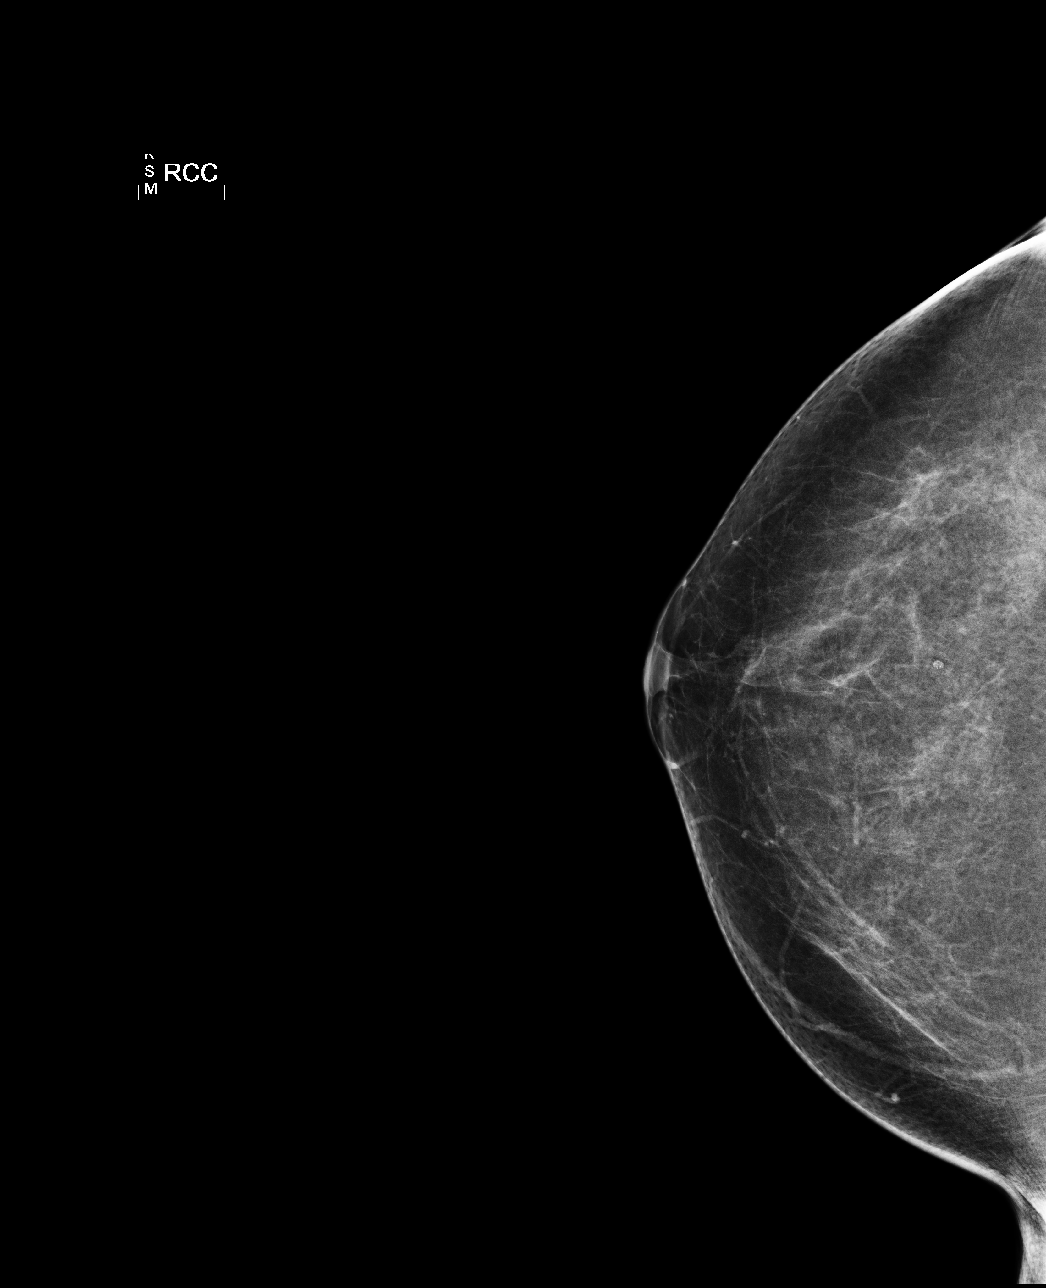

[L CC]
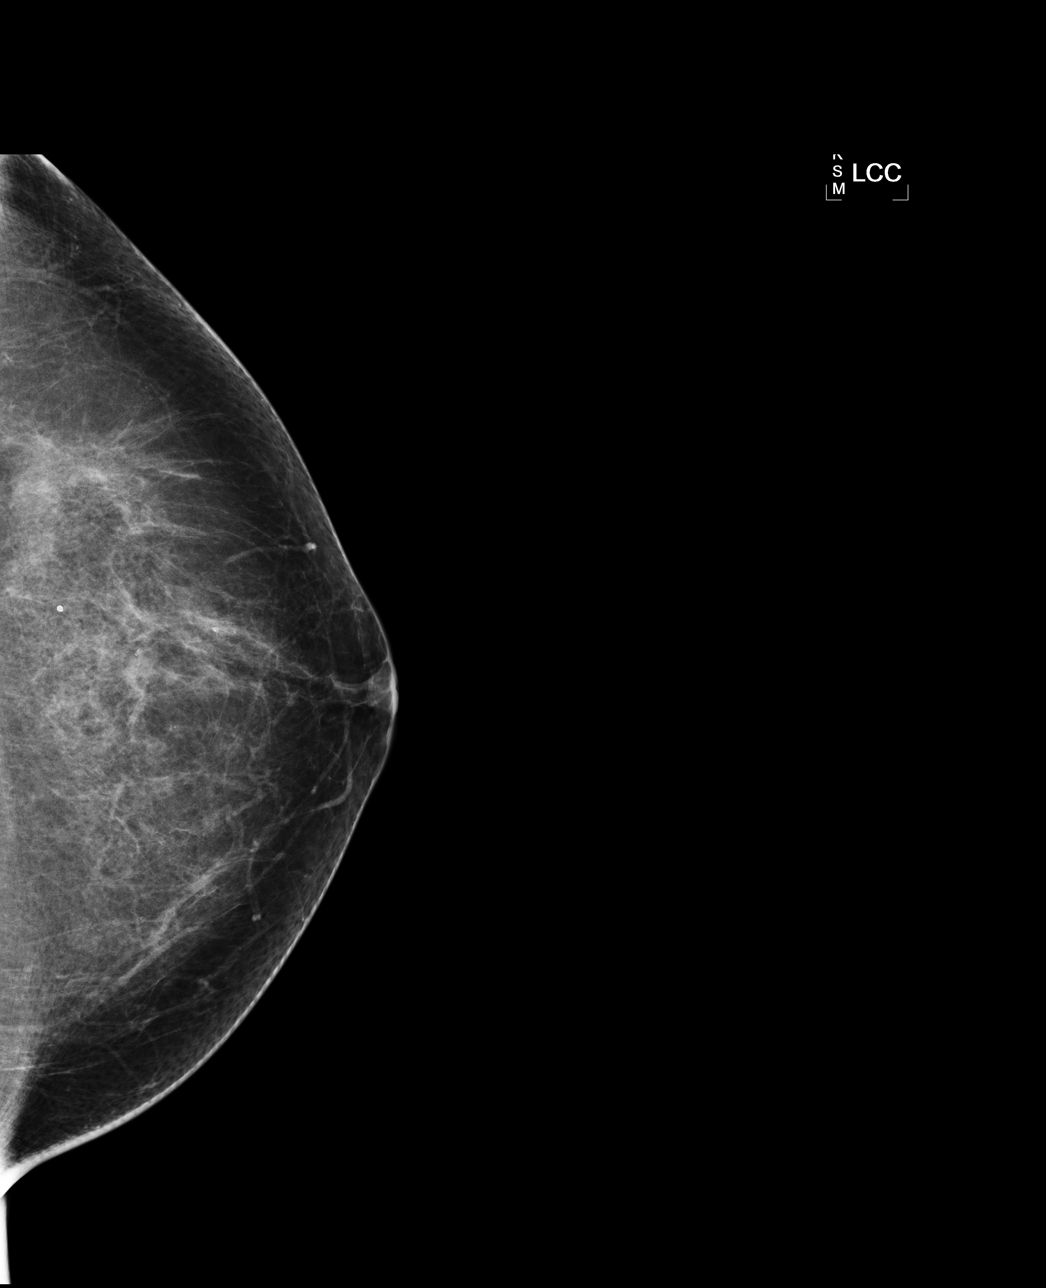

[L MLO]
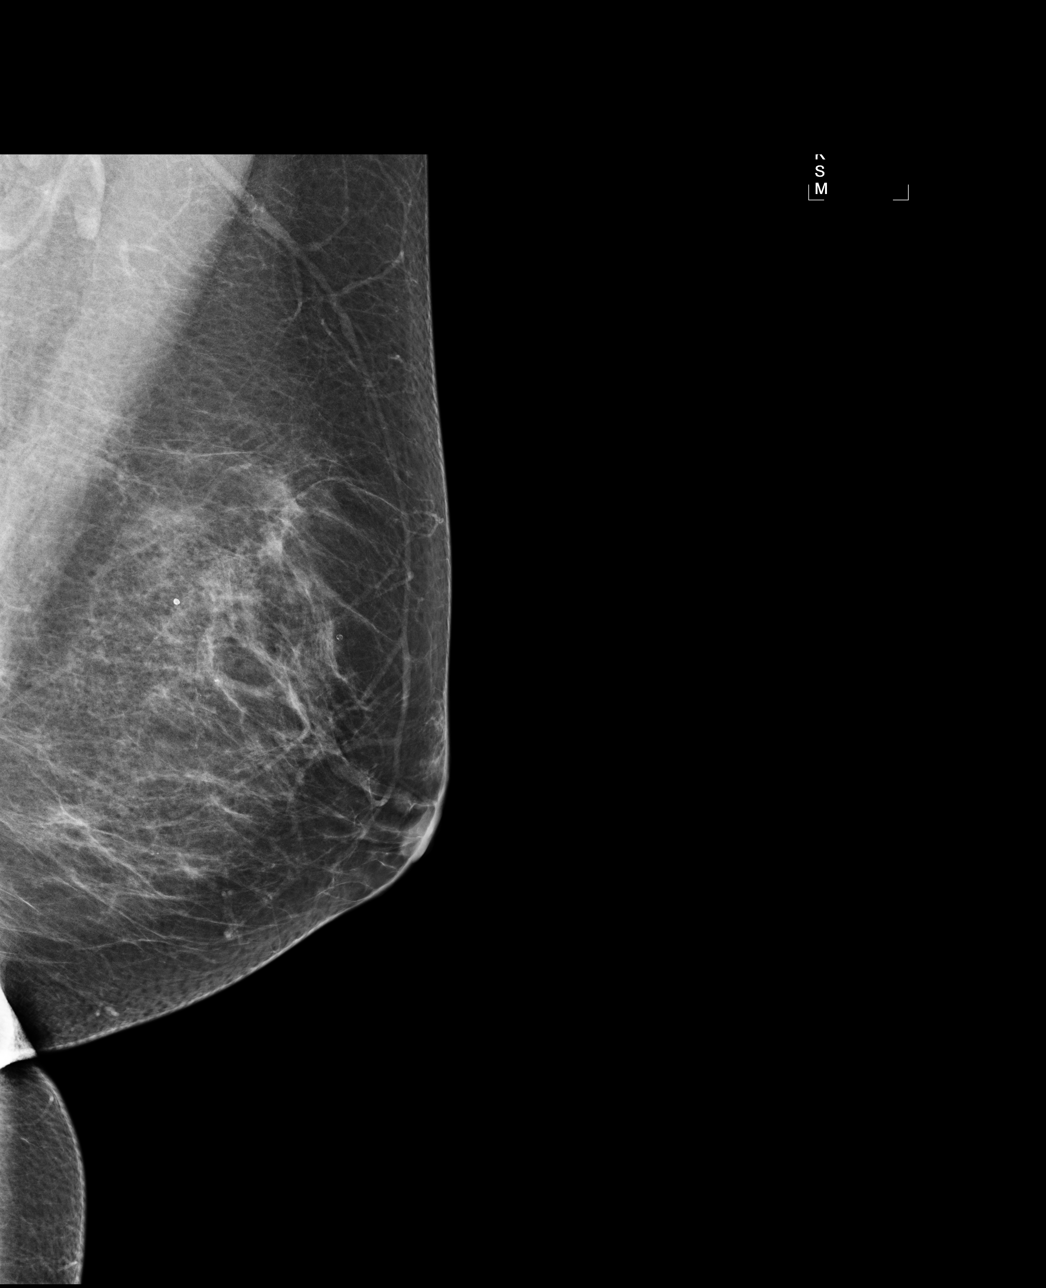

[R MLO]
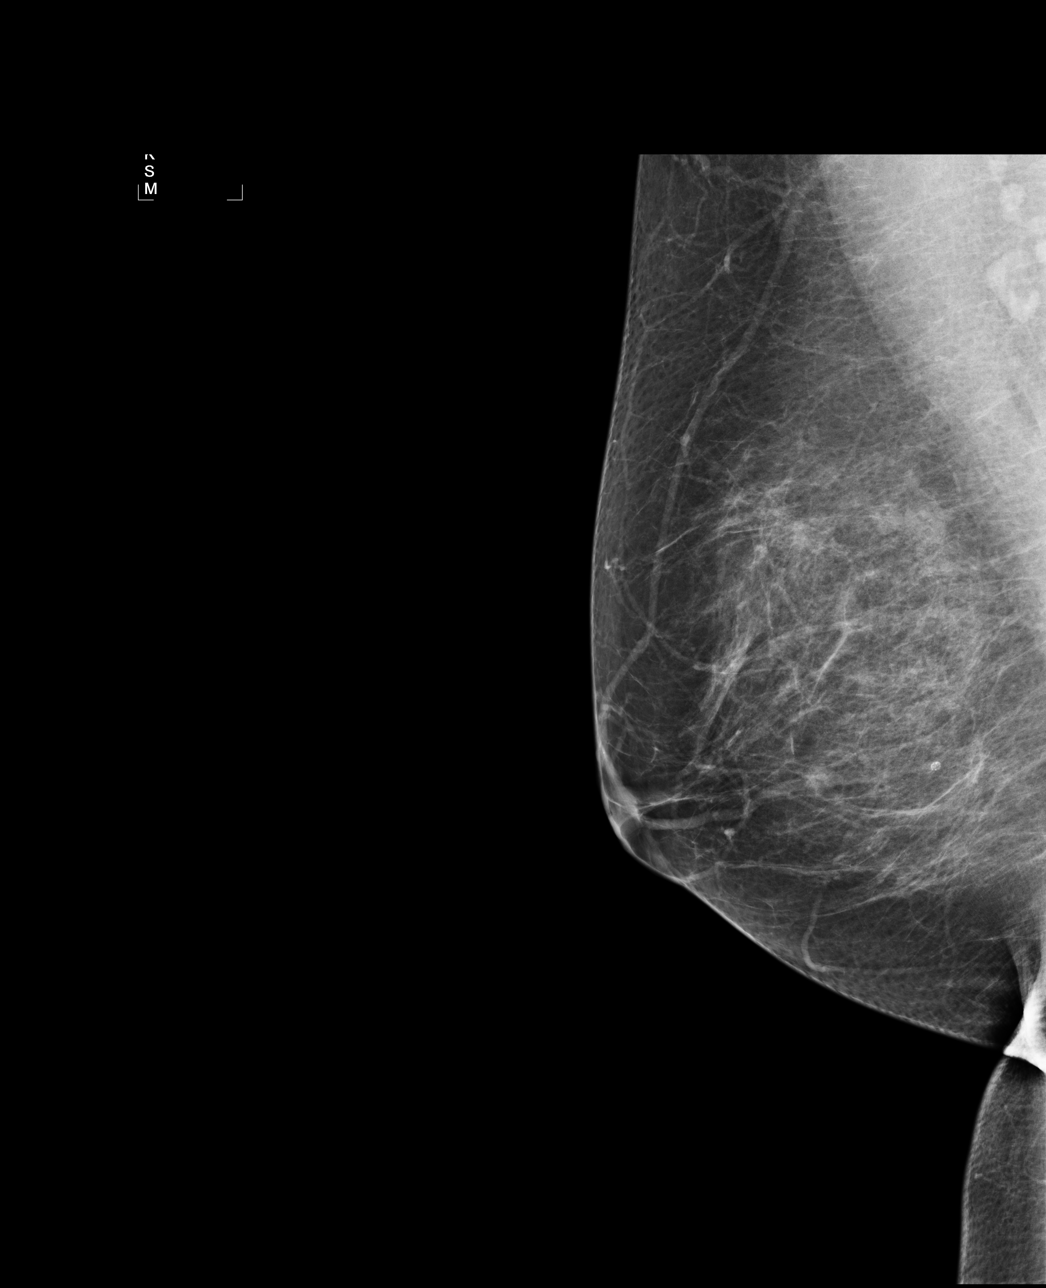

[4 of 4 positions shown; findings below may reference images not displayed]

FINDINGS: There are scattered fibroglandular densities. No
suspicious masses, architectural distortion, or calcifications are
present.

Images were processed with CAD.
IMPRESSION: No mammographic evidence of malignancy.

A result letter of this screening mammogram will be mailed directly
to the patient.

RECOMMENDATION:
Screening mammogram in one year. (Code:[XG])

BI-RADS CATEGORY 1:  Negative.

## 2011-11-28 ENCOUNTER — Encounter: Payer: Self-pay | Admitting: Family Medicine

## 2011-11-28 ENCOUNTER — Ambulatory Visit (INDEPENDENT_AMBULATORY_CARE_PROVIDER_SITE_OTHER): Payer: BC Managed Care – PPO | Admitting: Family Medicine

## 2011-11-28 VITALS — BP 138/88 | HR 102 | Temp 98.3°F | Ht 65.0 in | Wt 165.8 lb

## 2011-11-28 DIAGNOSIS — B9689 Other specified bacterial agents as the cause of diseases classified elsewhere: Secondary | ICD-10-CM

## 2011-11-28 DIAGNOSIS — J019 Acute sinusitis, unspecified: Secondary | ICD-10-CM

## 2011-11-28 MED ORDER — AMOXICILLIN-POT CLAVULANATE 875-125 MG PO TABS: 1.0000 | ORAL_TABLET | Freq: Two times a day (BID) | ORAL | Status: DC

## 2011-11-28 MED ORDER — GUAIFENESIN-CODEINE 100-10 MG/5ML PO SYRP: 5.0000 mL | ORAL_SOLUTION | Freq: Four times a day (QID) | ORAL | Status: DC | PRN

## 2011-11-28 NOTE — Progress Notes (Signed)
Subjective:    Patient ID: Penny Gardner, female    DOB: Dec 28, 1955, 56 y.o.   MRN: [id number]  HPI Sick for 7 days with uri symptoms - not getting better Unable to sleep well  Exhausted Got better and then worse Sinus pressure and cough and congestion ST R ear pain - like a knife   Is taking otc tylenol cold , then mucinex DM- helps for a few hours  claritin D - helps decongest a bit  Drinking fluids  Was blowing out yellow and green mucous  Cough is very wet  occ a little wheezing  Hoarse- and worse when she talks Has been out of work for a week   ? If fever - is warm at times   Patient Active Problem List  Diagnosis  . COLONIC POLYPS  . SUBCONJUNCTIVAL HEMORRHAGE, LEFT  . CERUMEN IMPACTION, BILATERAL  . ESOPHAGEAL STRICTURE  . GERD  . HIATAL HERNIA  . DIVERTICULOSIS, COLON  . PLANTAR FASCIITIS, BILATERAL  . ASCUS PAP  . Routine general medical examination at a health care facility  . Fear of flying  . Cerumen impaction   Past Medical History  Diagnosis Date  . Papanicolaou smear of cervix with atypical squamous cells of undetermined significance (ASC-US)     colposcopy 3/07, ASCUS  . Impacted cerumen   . Benign neoplasm of colon   . Diverticulosis of colon (without mention of hemorrhage)     colonoscopy 8/02, 03/11/07 abnormal  . Stricture and stenosis of esophagus   . Esophageal reflux     EGD positive 10/06  . Hiatal hernia   . Postmenopausal HRT (hormone replacement therapy)   . Plantar fascial fibromatosis   . Conjunctival hemorrhage   . Routine general medical examination at a health care facility    Past Surgical History  Procedure Date  . Cesarean section     x 2  . Ethmoidectomy    History  Substance Use Topics  . Smoking status: Never Smoker   . Smokeless tobacco: Not on file  . Alcohol Use: No   Family History  Problem Relation Age of Onset  . Cancer Mother     colon  . Diabetes Father   . Cancer Maternal Uncle     colon  .  Diabetes Paternal Grandmother    Allergies  Allergen Reactions  . Cefaclor     REACTION: itching   Current Outpatient Prescriptions on File Prior to Visit  Medication Sig Dispense Refill  . loratadine (CLARITIN) 10 MG tablet Take 10 mg by mouth daily.      Marland Kitchen omeprazole (PRILOSEC) 20 MG capsule Take 20 mg by mouth daily.          Review of Systems Review of Systems  Constitutional: Negative for  appetite change, fatigue and unexpected weight change.  Eyes: Negative for pain and visual disturbance.  ENt pos for sinus and ear pain and congestion Respiratory: Negative for wheeze and shortness of breath.   Cardiovascular: Negative for cp or palpitations    Gastrointestinal: Negative for nausea, diarrhea and constipation.  Genitourinary: Negative for urgency and frequency.  Skin: Negative for pallor or rash   Neurological: Negative for weakness, light-headedness, numbness and headaches.  Hematological: Negative for adenopathy. Does not bruise/bleed easily.  Psychiatric/Behavioral: Negative for dysphoric mood. The patient is not nervous/anxious.         Objective:   Physical Exam  Constitutional: She appears well-developed and well-nourished. No distress.  HENT:  Head: Normocephalic and  atraumatic.  Right Ear: External ear normal.  Left Ear: External ear normal.  Mouth/Throat: Oropharynx is clear and moist. No oropharyngeal exudate.       TM R has effusion Bilateral maxillary and ethmoid sinus tenderness Clear post nasal drip  Nares are injected and congested    Eyes: Conjunctivae normal and EOM are normal. Pupils are equal, round, and reactive to light. Right eye exhibits no discharge. Left eye exhibits no discharge. No scleral icterus.  Neck: Normal range of motion. Neck supple.  Cardiovascular: Normal rate, normal heart sounds and intact distal pulses.   Pulmonary/Chest: Effort normal and breath sounds normal. No respiratory distress. She has no wheezes. She has no rales.    Lymphadenopathy:    She has no cervical adenopathy.  Neurological: She is alert. She has normal reflexes.  Skin: Skin is warm and dry. No rash noted. No erythema. No pallor.  Psychiatric: She has a normal mood and affect.          Assessment & Plan:

## 2011-11-28 NOTE — Patient Instructions (Addendum)
I think you are developing a sinus infection out of a head and chest cold Fluids and rest are important  If working or driving - take mucinex DM If home or at bedtime- try the codeine cough med (it can sedate)  Tylenol is ok for fever  Nasal saline spray is also helprul  Take the augmentin for sinus infection

## 2011-11-28 NOTE — Assessment & Plan Note (Signed)
With uri and cough also  Hx of sinus surgery in the past tx with augmentin  Disc symptomatic care - see instructions on AVS  Update if not starting to improve in a week or if worsening   Robitussin AC ok at night

## 2011-11-29 ENCOUNTER — Encounter: Payer: Self-pay | Admitting: Family Medicine

## 2011-12-13 ENCOUNTER — Telehealth: Payer: Self-pay | Admitting: Family Medicine

## 2011-12-13 NOTE — Telephone Encounter (Signed)
Patient Information:  Caller Name: Bryony  Phone: [phone]  Patient: Penny Gardner, Penny Gardner  Gender: Female  DOB: Nov 21, 1955  Age: 56 Years  PCP: Roxy Manns [facility])   Symptoms  Reason For Call & Symptoms: She states a sinus infection a week before Thanksgiving. Diagnosed with sinus infection.  Seen in office 11/28/11. She completed all medication and took Mucinex.  She was better for one day. She has continued with cough dry hacky,  +headache, pain in ears.  Reviewed Health History In EMR: Yes  Reviewed Medications In EMR: Yes  Reviewed Allergies In EMR: Yes  Reviewed Surgeries / Procedures: No  Date of Onset of Symptoms: 12/12/2011  Treatments Tried: mucinex Bid  Treatments Tried Worked: No  Any Fever: Yes  Fever Taken: Tactile  Fever Time Of Reading: 00:00:00  Fever Last Reading: N/A  Guideline(s) Used:  Sinus Pain and Congestion  Disposition Per Guideline:   See Today in Office  Reason For Disposition Reached:   Sinus pain (not just congestion) and fever  Advice Given:  Reassurance:   Sinus congestion is a normal part of a cold.  Usually home treatment with nasal washes can prevent an actual bacterial sinus infection.  For a Runny Nose With Profuse Discharge:  Nasal mucus and discharge helps to wash viruses and bacteria out of the nose and sinuses.  Blowing the nose is all that is needed.  If the skin around your nostrils gets irritated, apply a tiny amount of petroleum ointment to the nasal openings once or twice a day.  For a Stuffy Nose - Use Nasal Washes:  Introduction: Saline (salt water) nasal irrigation (nasal wash) is an effective and simple home remedy for treating stuffy nose and sinus congestion. The nose can be irrigated by pouring, spraying, or squirting salt water into the nose and then letting it run back out.  How it Helps: The salt water rinses out excess mucus, washes out any irritants (dust, allergens) that might be present, and moistens  the nasal cavity.  Methods: There are several ways to perform nasal irrigation. You can use a saline nasal spray bottle (available over-the-counter), a rubber ear syringe, a medical syringe without the needle, or a Neti Pot.  Pain and Fever Medicines:  For pain or fever relief, take either acetaminophen or ibuprofen.  They are over-the-counter (OTC) drugs that help treat both fever and pain. You can buy them at the drugstore.  Treat fevers above 101 F (38.3 C). The goal of fever therapy is to bring the fever down to a comfortable level. Remember that fever medicine usually lowers fever 2 degrees F (1 - 1 1/2 degrees C).  Acetaminophen (e.g., Tylenol):  Extra Strength Tylenol: Take 1,000 mg (two 500 mg pills) every 8 hours as needed. Each Extra Strength Tylenol pill has 500 mg of acetaminophen.  Ibuprofen (e.g., Motrin, Advil):  Take 400 mg (two 200 mg pills) by mouth every 6 hours.  Hydration:  Drink plenty of liquids (6-8 glasses of water daily). If the air in your home is dry, use a cool mist humidifier  Expected Course:  Sinus congestion from viral upper respiratory infections (colds) usually lasts 5-10 days.  Occasionally a cold can worsen and turn into bacterial sinusitis. Clues to this are sinus symptoms lasting longer than 10 days, fever lasting longer than 3 days, and worsening pain. Bacterial sinusitis may need antibiotic treatment.  Call Back If:   Severe pain lasts longer than 2 hours after pain medicine  Sinus  pain lasts longer than 1 day after starting treatment using nasal washes  Sinus congestion (fullness) lasts longer than 10 days  Fever lasts longer than 3 days  You become worse.  Office Follow Up:  Does the office need to follow up with this patient?: No  Instructions For The Office: N/A  Appointment Scheduled:  12/14/2011 16:00:00 Appointment Scheduled Provider:  Roxy Manns [facility])  RN Note:  Patient activley coughing in triage. Hacky and dry

## 2011-12-14 ENCOUNTER — Ambulatory Visit (INDEPENDENT_AMBULATORY_CARE_PROVIDER_SITE_OTHER): Payer: BC Managed Care – PPO | Admitting: Family Medicine

## 2011-12-14 ENCOUNTER — Encounter: Payer: Self-pay | Admitting: Family Medicine

## 2011-12-14 VITALS — BP 122/76 | HR 87 | Temp 98.3°F | Ht 65.0 in | Wt 169.0 lb

## 2011-12-14 DIAGNOSIS — J019 Acute sinusitis, unspecified: Secondary | ICD-10-CM

## 2011-12-14 DIAGNOSIS — B9689 Other specified bacterial agents as the cause of diseases classified elsewhere: Secondary | ICD-10-CM

## 2011-12-14 MED ORDER — AMOXICILLIN-POT CLAVULANATE 875-125 MG PO TABS: 1.0000 | ORAL_TABLET | Freq: Two times a day (BID) | ORAL | Status: DC

## 2011-12-14 NOTE — Patient Instructions (Addendum)
Take another week of augmentin mucinex DM is ok for cough  If worse or no improvement please let me know  Drink lots of fluids Try to get some rest

## 2011-12-14 NOTE — Telephone Encounter (Signed)
Will see her then 

## 2011-12-14 NOTE — Progress Notes (Signed)
Subjective:    Patient ID: Penny Gardner, female    DOB: 07/06/1955, 56 y.o.   MRN: [id number]  HPI tx for sinus infection 11/20 Was much much better for several days -now bad again   Is taking mucinex DM max   Sinuses hurt- up above her eyes Ears are hurting  Congestion but cannot get anything out her nose-all going down the back of your throat  Cough - is dry and tickly Yesterday felt very feverish -- chills / fatigue  Taking tylenol today to suppress it   Hx of sinus surgery  Dev septum and ethmoidectomy   Patient Active Problem List  Diagnosis  . COLONIC POLYPS  . SUBCONJUNCTIVAL HEMORRHAGE, LEFT  . CERUMEN IMPACTION, BILATERAL  . ESOPHAGEAL STRICTURE  . GERD  . HIATAL HERNIA  . DIVERTICULOSIS, COLON  . PLANTAR FASCIITIS, BILATERAL  . ASCUS PAP  . Routine general medical examination at a health care facility  . Fear of flying  . Cerumen impaction  . Acute bacterial sinusitis   Past Medical History  Diagnosis Date  . Papanicolaou smear of cervix with atypical squamous cells of undetermined significance (ASC-US)     colposcopy 3/07, ASCUS  . Impacted cerumen   . Benign neoplasm of colon   . Diverticulosis of colon (without mention of hemorrhage)     colonoscopy 8/02, 03/11/07 abnormal  . Stricture and stenosis of esophagus   . Esophageal reflux     EGD positive 10/06  . Hiatal hernia   . Postmenopausal HRT (hormone replacement therapy)   . Plantar fascial fibromatosis   . Conjunctival hemorrhage   . Routine general medical examination at a health care facility    Past Surgical History  Procedure Date  . Cesarean section     x 2  . Ethmoidectomy    History  Substance Use Topics  . Smoking status: Never Smoker   . Smokeless tobacco: Not on file  . Alcohol Use: No   Family History  Problem Relation Age of Onset  . Cancer Mother     colon  . Diabetes Father   . Cancer Maternal Uncle     colon  . Diabetes Paternal Grandmother    Allergies   Allergen Reactions  . Cefaclor     REACTION: itching   Current Outpatient Prescriptions on File Prior to Visit  Medication Sig Dispense Refill  . loratadine (CLARITIN) 10 MG tablet Take 10 mg by mouth daily.      Marland Kitchen omeprazole (PRILOSEC) 20 MG capsule Take 20 mg by mouth daily.          Review of Systems Review of Systems  Constitutional: Negative for fever, appetite change, fatigue and unexpected weight change.  Eyes: Negative for pain and visual disturbance.  ENT pos for sinus pain/ cong / st  Respiratory: Negative for wheeze and shortness of breath.  pos for persistent cough Cardiovascular: Negative for cp or palpitations    Gastrointestinal: Negative for nausea, diarrhea and constipation.  Genitourinary: Negative for urgency and frequency.  Skin: Negative for pallor or rash   Neurological: Negative for weakness, light-headedness, numbness and headaches.  Hematological: Negative for adenopathy. Does not bruise/bleed easily.  Psychiatric/Behavioral: Negative for dysphoric mood. The patient is not nervous/anxious.         Objective:   Physical Exam  Constitutional: She appears well-developed and well-nourished. No distress.  HENT:  Head: Normocephalic and atraumatic.  Right Ear: External ear normal.  Left Ear: External ear normal.  Mouth/Throat: Oropharynx  is clear and moist.       Nares are injected and congested  Frontal sinuses tender bilaterally  Eyes: Conjunctivae normal and EOM are normal. Pupils are equal, round, and reactive to light. Right eye exhibits no discharge. Left eye exhibits no discharge.  Neck: Normal range of motion. Neck supple. No thyromegaly present.  Cardiovascular: Normal rate, regular rhythm and normal heart sounds.   Pulmonary/Chest: Effort normal and breath sounds normal. No respiratory distress. She has no wheezes. She has no rales.       Harsh dry cough  Lymphadenopathy:    She has no cervical adenopathy.  Neurological: She is alert. She has  normal reflexes.  Skin: Skin is warm and dry. No rash noted.  Psychiatric: She has a normal mood and affect.          Assessment & Plan:

## 2011-12-16 NOTE — Assessment & Plan Note (Signed)
Recurrent or not completely treated in pt with hx of sinus surgery Will tx with 7 more days of augmentin Disc symptomatic care - see instructions on AVS  If not improved will need to return to ENT

## 2012-01-08 ENCOUNTER — Telehealth: Payer: Self-pay | Admitting: Internal Medicine

## 2012-01-08 ENCOUNTER — Ambulatory Visit (HOSPITAL_COMMUNITY)
Admission: RE | Admit: 2012-01-08 | Discharge: 2012-01-08 | Disposition: A | Discharge status: Home / Self Care | Payer: BC Managed Care – PPO | Source: Ambulatory Visit | Attending: Family Medicine | Admitting: Family Medicine

## 2012-01-08 ENCOUNTER — Encounter: Payer: Self-pay | Admitting: Family Medicine

## 2012-01-08 ENCOUNTER — Ambulatory Visit (INDEPENDENT_AMBULATORY_CARE_PROVIDER_SITE_OTHER): Payer: BC Managed Care – PPO | Admitting: Family Medicine

## 2012-01-08 VITALS — BP 110/78 | HR 88 | Temp 98.6°F | Ht 65.0 in | Wt 170.5 lb

## 2012-01-08 DIAGNOSIS — R509 Fever, unspecified: Secondary | ICD-10-CM | POA: Insufficient documentation

## 2012-01-08 DIAGNOSIS — R1030 Lower abdominal pain, unspecified: Secondary | ICD-10-CM

## 2012-01-08 DIAGNOSIS — R109 Unspecified abdominal pain: Secondary | ICD-10-CM

## 2012-01-08 DIAGNOSIS — K5732 Diverticulitis of large intestine without perforation or abscess without bleeding: Secondary | ICD-10-CM | POA: Insufficient documentation

## 2012-01-08 LAB — CBC WITH DIFFERENTIAL/PLATELET
Basophils Relative: 0 % (ref 0–1)
Eosinophils Absolute: 0.1 10*3/uL (ref 0.0–0.7)
HCT: 37.9 % (ref 36.0–46.0)
Hemoglobin: 13.1 g/dL (ref 12.0–15.0)
MCH: 29.9 pg (ref 26.0–34.0)
MCHC: 34.6 g/dL (ref 30.0–36.0)
Monocytes Absolute: 0.7 10*3/uL (ref 0.1–1.0)
Monocytes Relative: 8 % (ref 3–12)
Neutrophils Relative %: 63 % (ref 43–77)

## 2012-01-08 LAB — BASIC METABOLIC PANEL
CO2: 27 mEq/L (ref 19–32)
Chloride: 104 mEq/L (ref 96–112)
Creat: 0.6 mg/dL (ref 0.50–1.10)
Glucose, Bld: 94 mg/dL (ref 70–99)

## 2012-01-08 LAB — POCT URINALYSIS DIPSTICK
Ketones, UA: NEGATIVE
Spec Grav, UA: 1.01
pH, UA: 6.5

## 2012-01-08 IMAGING — CT CT ABD-PELV W/ CM
2 of 5 series · 17 of 46 positions shown, 19 images · IV contrast (OMNIPAQUE)
Comparison: None.

CLINICAL DATA: Left lower quadrant pain.  Fever.  Diverticulitis.

CT ABDOMEN AND PELVIS WITH CONTRAST
TECHNIQUE: Multidetector CT imaging of the abdomen and pelvis was
performed following the standard protocol during bolus
administration of intravenous contrast.
Contrast: 80mL OMNIPAQUE IOHEXOL 300 MG/ML  SOLN a

[Series 2: rtn a/p with · axial · 0.74mm/px · z∈[-631,-246]mm · 14 of 87 slices shown, 16 images]
[im 5/87  soft-tissue]
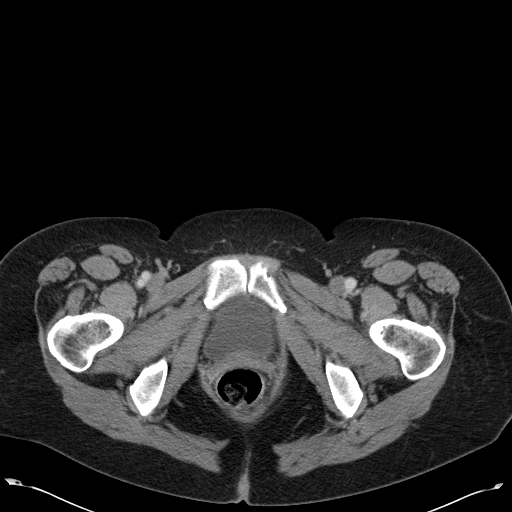
[im 5/87  bone]
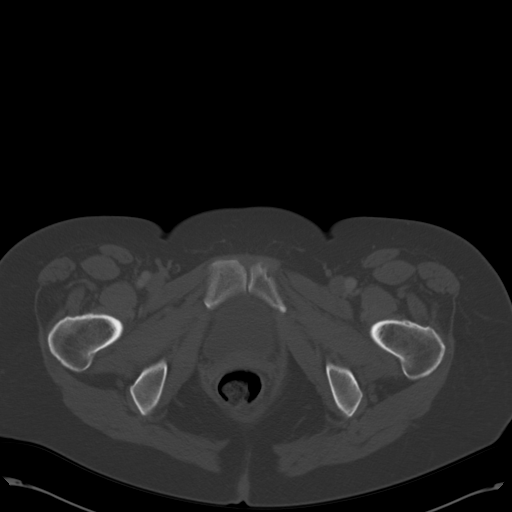
[im 13/87  soft-tissue]
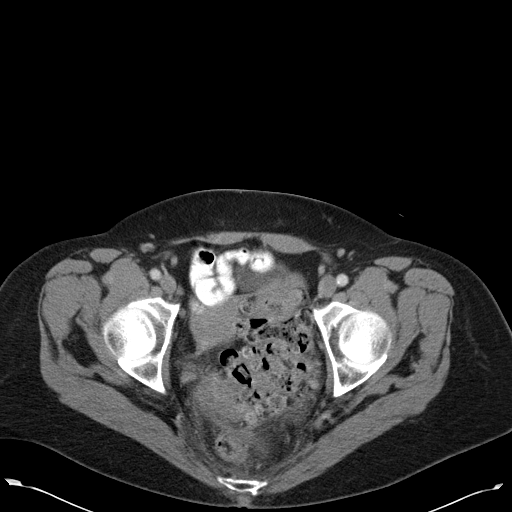
[im 18/87  soft-tissue]
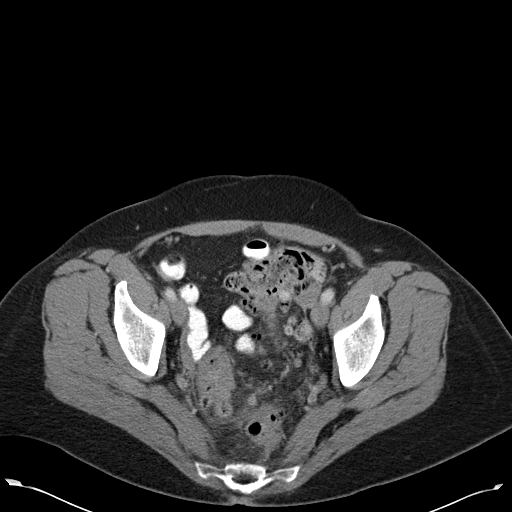
[im 22/87  soft-tissue]
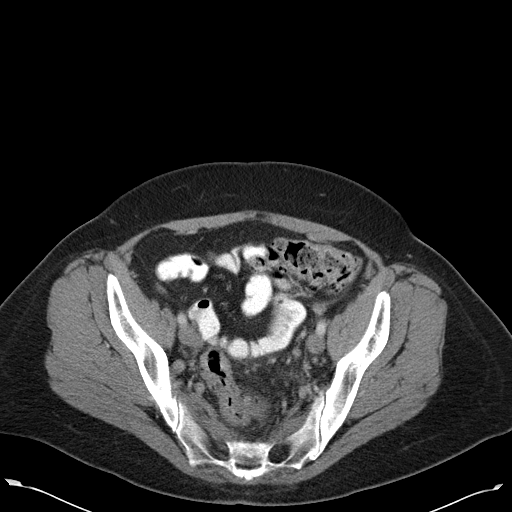
[im 31/87  soft-tissue]
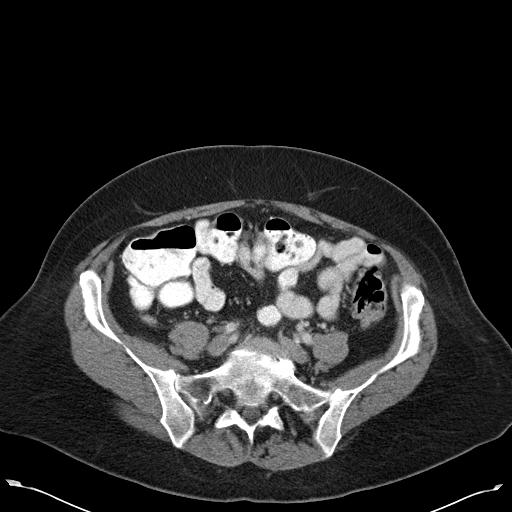
[im 35/87  soft-tissue]
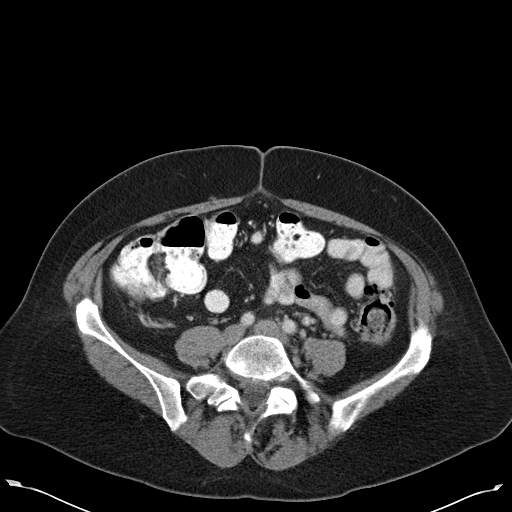
[im 39/87  soft-tissue]
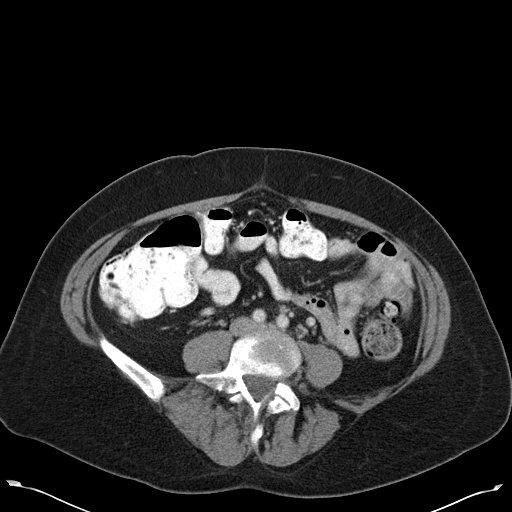
[im 48/87  soft-tissue]
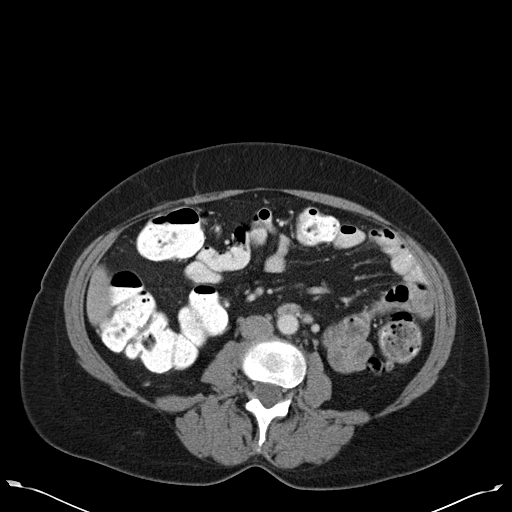
[im 52/87  soft-tissue]
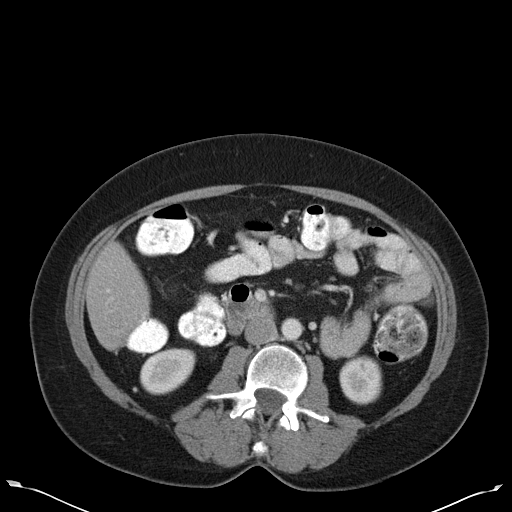
[im 52/87  bone]
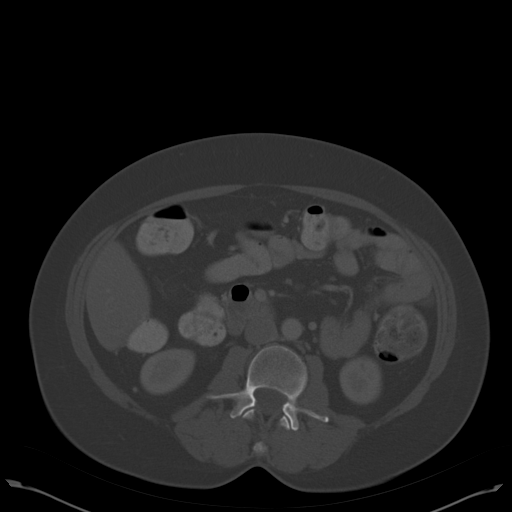
[im 56/87  soft-tissue]
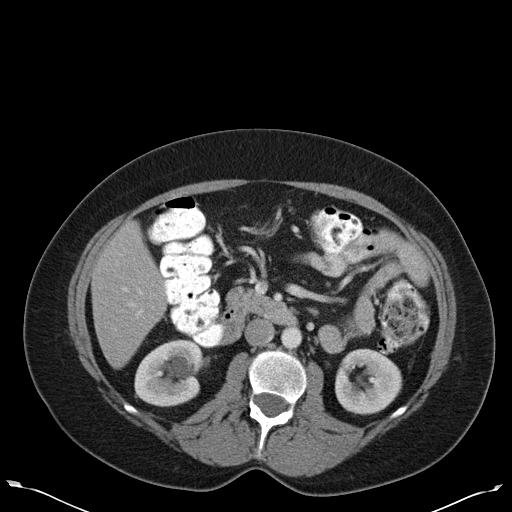
[im 65/87  soft-tissue]
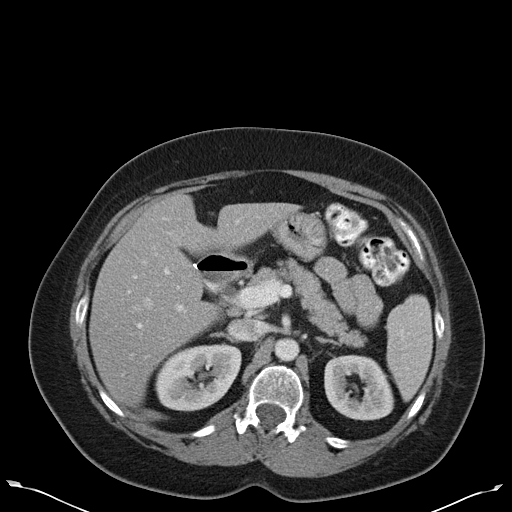
[im 69/87  soft-tissue]
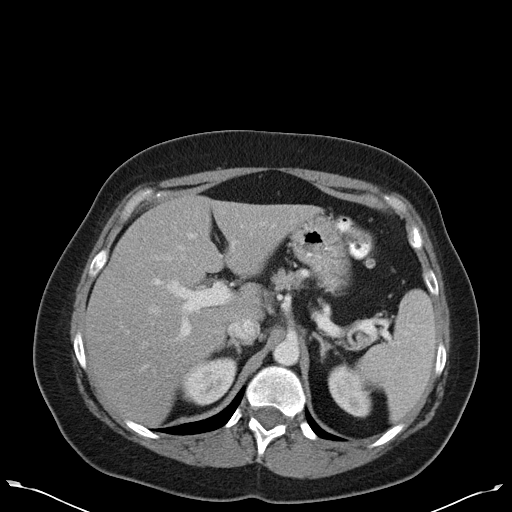
[im 74/87  soft-tissue]
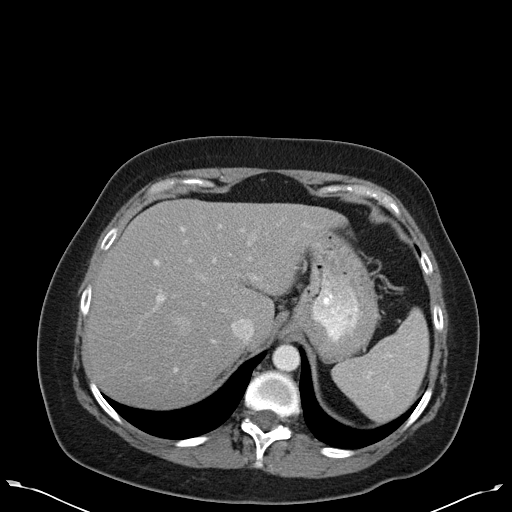
[im 82/87  soft-tissue]
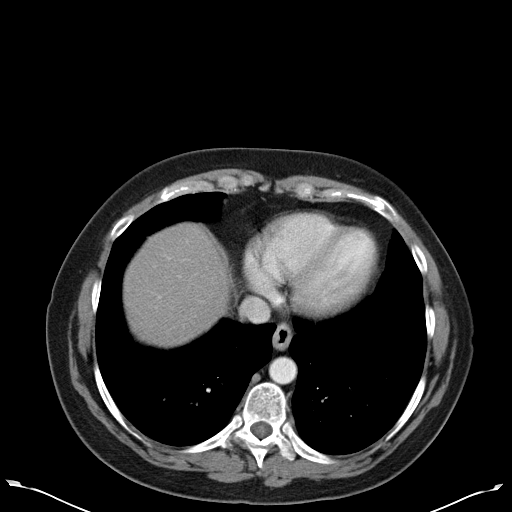

[Series 603: cor · coronal · 0.85mm/px · 3 of 125 slices shown]
[im 42/125  soft-tissue]
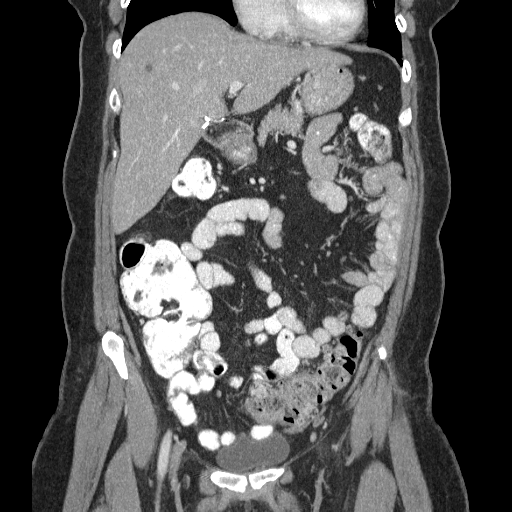
[im 56/125  soft-tissue]
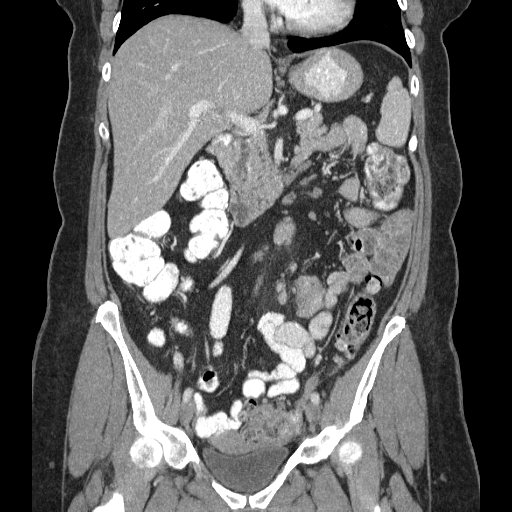
[im 69/125  soft-tissue]
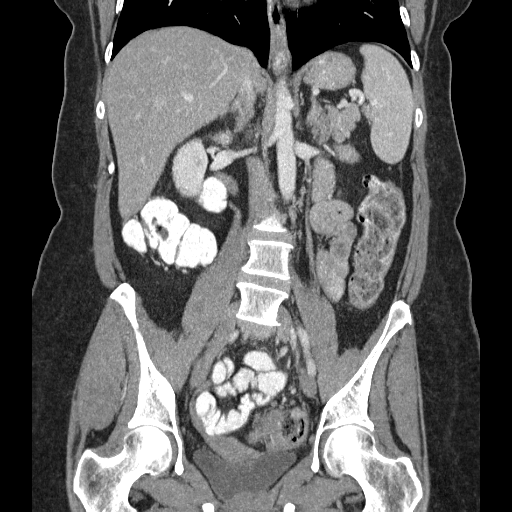

[17 of 46 positions shown; findings below may reference images not displayed]

FINDINGS: Colonic diverticulosis is seen.  Distal sigmoid colonic
wall thickening and pericolonic inflammatory changes seen,
consistent with mild diverticulitis.  No evidence of abscess or
extraluminal air.  No evidence of free fluid.

A tiny less than 1 cm low attenuation lesion in the liver likely
represents a tiny cyst.  No definite liver masses are identified.
Clips seen from prior cholecystectomy.  The pancreas, spleen,
adrenal glands, and kidneys are normal in appearance.  No soft
tissue masses or lymphadenopathy identified.
IMPRESSION: Mild distal sigmoid diverticulitis.  No evidence of abscess or
other complication.

## 2012-01-08 MED ORDER — METRONIDAZOLE 500 MG PO TABS: 500.0000 mg | ORAL_TABLET | Freq: Three times a day (TID) | ORAL | Status: DC

## 2012-01-08 MED ORDER — IOHEXOL 300 MG/ML  SOLN
80.0000 mL | Freq: Once | INTRAMUSCULAR | Status: AC | PRN
  Administered 2012-01-08: 80 mL via INTRAVENOUS

## 2012-01-08 MED ORDER — CIPROFLOXACIN HCL 500 MG PO TABS: 500.0000 mg | ORAL_TABLET | Freq: Two times a day (BID) | ORAL | Status: DC

## 2012-01-08 NOTE — Assessment & Plan Note (Addendum)
Urine appears likely contaminated.. Will send for culture.  Concern about infection given fever.. Check wbc. Not exactly consistent with viral GE given no diarrhea or emesis.  Most likely diverticultisits given pain greatest over sigmoid colon.  Doubt bladder, ovarian, uterine pathology.  Will eval further with CT abd pelvis she has never had diverticulitis in past. Total visit time 25 minutes, > 50% spent counseling and cordinating patients care.

## 2012-01-08 NOTE — Telephone Encounter (Signed)
On call - received call via CAN from radiology confirming suspected acute sigmoid diverticulitis, no complicating features on CT - i then spoke with pt re: same -  sent erx cipro + flagyl to [facility] and pt will start tonight.  Advised ibuprofen 600mg  q6h prn pain or fever -  pt understands and will call if worse or unimproved

## 2012-01-08 NOTE — Patient Instructions (Addendum)
Stop at front desk to set up CT abd/pelvis today.  We will call you with results. Verify your contact info at front desk please. Stop at lab for STAT labs. We will call you with urine culture result. No food until after abd pelvic CT.

## 2012-01-08 NOTE — Progress Notes (Signed)
  Subjective:    Patient ID: Penny Gardner, female    DOB: 12/12/1955, 56 y.o.   MRN: [id number]  HPI  56 year old female with history of diverticulosis on colonoscopy, GERD or esophageal stricture presents with 5 days history of abdominal pain.  Started with lower abdominal pain ( 10/10  On pain scale)and cramping/stabbing pain. Frequent BMs, loose but still formed (she tends towards constipation but had improved to soft stools in last few months), no pain with BM, no blood in stool. Some nausea no vomiting. No dysuria, no change in urination. Had fever.. Measured 100.2 this morning. No sick contacts. Has been eating a  Lot of rich foods over holidays, but only bland foods lately.  No hysterectomy, no appendectomy, is s/p cholecystectomy. 2 months ago had hemmorhoid anding and has been using benefiber  Aleve helps with pain 3/10  Review of Systems  Constitutional: Positive for fever. Negative for fatigue.  HENT: Negative for ear pain.   Eyes: Negative for pain.  Respiratory: Negative for chest tightness and shortness of breath.   Cardiovascular: Negative for chest pain, palpitations and leg swelling.  Gastrointestinal: Positive for abdominal pain. Negative for abdominal distention.  Genitourinary: Negative for dysuria.       Objective:   Physical Exam  Constitutional: Vital signs are normal. She appears well-developed and well-nourished. She is cooperative.  Non-toxic appearance. She does not appear ill. No distress.  HENT:  Head: Normocephalic.  Right Ear: Hearing, tympanic membrane, external ear and ear canal normal. Tympanic membrane is not erythematous, not retracted and not bulging.  Left Ear: Hearing, tympanic membrane, external ear and ear canal normal. Tympanic membrane is not erythematous, not retracted and not bulging.  Nose: No mucosal edema or rhinorrhea. Right sinus exhibits no maxillary sinus tenderness and no frontal sinus tenderness. Left sinus exhibits no  maxillary sinus tenderness and no frontal sinus tenderness.  Mouth/Throat: Uvula is midline, oropharynx is clear and moist and mucous membranes are normal.  Eyes: Conjunctivae normal, EOM and lids are normal. Pupils are equal, round, and reactive to light. No foreign bodies found.  Neck: Trachea normal and normal range of motion. Neck supple. Carotid bruit is not present. No mass and no thyromegaly present.  Cardiovascular: Normal rate, regular rhythm, S1 normal, S2 normal, normal heart sounds, intact distal pulses and normal pulses.  Exam reveals no gallop and no friction rub.   No murmur heard. Pulmonary/Chest: Effort normal and breath sounds normal. Not tachypneic. No respiratory distress. She has no decreased breath sounds. She has no wheezes. She has no rhonchi. She has no rales.  Abdominal: Soft. Normal appearance and bowel sounds are normal. There is no hepatosplenomegaly. There is tenderness in the suprapubic area and left lower quadrant. There is no rigidity, no rebound, no guarding and no CVA tenderness. No hernia. Hernia confirmed negative in the ventral area, confirmed negative in the right inguinal area and confirmed negative in the left inguinal area.  Neurological: She is alert.  Skin: Skin is warm, dry and intact. No rash noted.  Psychiatric: Her speech is normal and behavior is normal. Judgment and thought content normal. Her mood appears not anxious. Cognition and memory are normal. She does not exhibit a depressed mood.          Assessment & Plan:

## 2012-01-08 NOTE — Addendum Note (Signed)
Addended by: Josph Macho A on: 01/08/2012 05:38 PM   Modules accepted: Orders

## 2012-01-10 ENCOUNTER — Telehealth: Payer: Self-pay | Admitting: Family Medicine

## 2012-01-10 LAB — URINE CULTURE: Colony Count: 3000

## 2012-01-10 NOTE — Telephone Encounter (Signed)
[address] [place], [place] [address] p. [phone] f. [phone] To: [facility] (After Hours Triage) Fax: [phone] From: [facility] Date/ Time: 01/08/2012 7:25 PM Taken By: Reita Cliche, CSR Caller: Penny Gardner Facility: not collected Patient: Penny, Gardner DOB: 09-01-1955 Phone: [phone] Reason for Call: Penny Gardner is calling with results for CT ordered by Kerby Nora [facility]). The results are mild distal sigmoid diverticulitis no evidence of abscess or any other complication and were read by Dr. Myles Rosenthal. Regarding Appointment: Appt Date: Appt Time: Unknown Provider: Roxy Manns [facility]) Reason: Details:

## 2012-01-15 ENCOUNTER — Ambulatory Visit: Payer: BC Managed Care – PPO | Admitting: Family Medicine

## 2012-01-15 ENCOUNTER — Ambulatory Visit (INDEPENDENT_AMBULATORY_CARE_PROVIDER_SITE_OTHER): Payer: BC Managed Care – PPO | Admitting: Family Medicine

## 2012-01-15 ENCOUNTER — Encounter: Payer: Self-pay | Admitting: Family Medicine

## 2012-01-15 VITALS — BP 120/78 | HR 97 | Temp 98.3°F | Ht 65.0 in | Wt 170.0 lb

## 2012-01-15 DIAGNOSIS — Z888 Allergy status to other drugs, medicaments and biological substances status: Secondary | ICD-10-CM

## 2012-01-15 DIAGNOSIS — K5732 Diverticulitis of large intestine without perforation or abscess without bleeding: Secondary | ICD-10-CM

## 2012-01-15 DIAGNOSIS — B3731 Acute candidiasis of vulva and vagina: Secondary | ICD-10-CM | POA: Insufficient documentation

## 2012-01-15 DIAGNOSIS — B373 Candidiasis of vulva and vagina: Secondary | ICD-10-CM

## 2012-01-15 DIAGNOSIS — T7840XA Allergy, unspecified, initial encounter: Secondary | ICD-10-CM | POA: Insufficient documentation

## 2012-01-15 MED ORDER — FLUCONAZOLE 150 MG PO TABS: 150.0000 mg | ORAL_TABLET | Freq: Once | ORAL | Status: DC

## 2012-01-15 NOTE — Patient Instructions (Signed)
Stop the antibiotics given rash and almost completed. Can use benadryl for allergic reaction. Take diflucan for vaginal candidiasis.

## 2012-01-15 NOTE — Assessment & Plan Note (Signed)
?   To cipro or flagyl. Stop antibiotics and take benadryl.

## 2012-01-15 NOTE — Assessment & Plan Note (Signed)
Almost completed antibiotics. Pain almost resolved. N/D likely SE to meds. Follow at home, call if abdominal pain returning or fever.

## 2012-01-15 NOTE — Progress Notes (Signed)
  Subjective:    Patient ID: KSENIA KUNZ, female    DOB: 08-17-1955, 57 y.o.   MRN: [id number]  HPI  57 year old female presents for follow up diverticulitis. She has been on metronidazole and cipro since 12/31. On 7/10 days.  She reports today that she has improved with antibiotics. Abdominal pain is better but still some cramping. Some nausea and diarrhea. No fever. She has noted rash on abdomen x 1 day. Itchy.  No blood in stool.  She has been having vaginal itching since been on antibitoics. Took Benadryl.   She has been eaeting a low fiber diet, bland foods. Review of Systems  Constitutional: Negative for fever and fatigue.  HENT: Negative for ear pain.   Eyes: Negative for pain.  Respiratory: Negative for chest tightness and shortness of breath.   Cardiovascular: Negative for chest pain, palpitations and leg swelling.  Gastrointestinal: Positive for nausea and diarrhea. Negative for abdominal pain.  Genitourinary: Negative for dysuria.       Objective:   Physical Exam  Constitutional: Vital signs are normal. She appears well-developed and well-nourished. She is cooperative.  Non-toxic appearance. She does not appear ill. No distress.  HENT:  Head: Normocephalic.  Right Ear: Hearing, tympanic membrane, external ear and ear canal normal. Tympanic membrane is not erythematous, not retracted and not bulging.  Left Ear: Hearing, tympanic membrane, external ear and ear canal normal. Tympanic membrane is not erythematous, not retracted and not bulging.  Nose: No mucosal edema or rhinorrhea. Right sinus exhibits no maxillary sinus tenderness and no frontal sinus tenderness. Left sinus exhibits no maxillary sinus tenderness and no frontal sinus tenderness.  Mouth/Throat: Uvula is midline, oropharynx is clear and moist and mucous membranes are normal.  Eyes: Conjunctivae normal, EOM and lids are normal. Pupils are equal, round, and reactive to light. No foreign bodies found.    Neck: Trachea normal and normal range of motion. Neck supple. Carotid bruit is not present. No mass and no thyromegaly present.  Cardiovascular: Normal rate, regular rhythm, S1 normal, S2 normal, normal heart sounds, intact distal pulses and normal pulses.  Exam reveals no gallop and no friction rub.   No murmur heard. Pulmonary/Chest: Effort normal and breath sounds normal. Not tachypneic. No respiratory distress. She has no decreased breath sounds. She has no wheezes. She has no rhonchi. She has no rales.  Abdominal: Soft. Normal appearance and bowel sounds are normal. She exhibits no mass. There is tenderness in the left lower quadrant. There is no rebound and no guarding.       Very mild, per pt barely there ttp in LLQ  Neurological: She is alert.  Skin: Skin is warm, dry and intact. No rash noted.  Psychiatric: Her speech is normal and behavior is normal. Judgment and thought content normal. Her mood appears not anxious. Cognition and memory are normal. She does not exhibit a depressed mood.          Assessment & Plan:

## 2012-01-15 NOTE — Assessment & Plan Note (Signed)
Treat with diflucan. 

## 2012-01-17 ENCOUNTER — Telehealth: Payer: Self-pay

## 2012-01-17 NOTE — Telephone Encounter (Signed)
Will send to Dr. Ermalene Searing since she evaluated her for this complaint.

## 2012-01-17 NOTE — Telephone Encounter (Signed)
Pt seen on 01/15/12 with ? Allergic reaction and vaginal itching. Pt states vaginal itching is gone since taking Diflucan but rash is worse and going down legs; cannot sleep due to itching. Pt request med for rash and itching to [facility] and pt request call back.

## 2012-01-18 MED ORDER — PREDNISONE (PAK) 10 MG PO TABS: ORAL_TABLET | ORAL | Status: DC

## 2012-01-18 NOTE — Telephone Encounter (Signed)
Patient advised.

## 2012-01-18 NOTE — Telephone Encounter (Signed)
Pt called back, states she still has rash over body, which is getting worse.  Itching badly.   Has small red bumps under her skin.

## 2012-01-18 NOTE — Telephone Encounter (Signed)
See note below.  Pt states her yeast infection has totally cleared up, but this itching problem is making her crazy.  Please advise.

## 2012-01-18 NOTE — Telephone Encounter (Signed)
I understand.. Not a vaginal rash going down legs but instead the red bumps diffusely on body from allergic reaction correct? We will have her start low dose prednisone taper. Will send to pharmacy. This will help with itching and allergic reaction rash.

## 2012-01-18 NOTE — Telephone Encounter (Signed)
Did she take the second dose of dicflucan? This should help with any fungal infection.

## 2012-02-01 ENCOUNTER — Encounter: Payer: Self-pay | Admitting: Family Medicine

## 2012-02-01 ENCOUNTER — Ambulatory Visit (INDEPENDENT_AMBULATORY_CARE_PROVIDER_SITE_OTHER): Payer: BC Managed Care – PPO | Admitting: Family Medicine

## 2012-02-01 VITALS — BP 120/78 | HR 92 | Temp 98.3°F | Ht 65.0 in | Wt 167.0 lb

## 2012-02-01 DIAGNOSIS — B373 Candidiasis of vulva and vagina: Secondary | ICD-10-CM

## 2012-02-01 DIAGNOSIS — B3731 Acute candidiasis of vulva and vagina: Secondary | ICD-10-CM

## 2012-02-01 LAB — POCT WET PREP (WET MOUNT)
Bacteria Wet Prep HPF POC: 0
KOH Wet Prep POC: NEGATIVE

## 2012-02-01 MED ORDER — FLUCONAZOLE 150 MG PO TABS: 150.0000 mg | ORAL_TABLET | Freq: Once | ORAL | Status: DC

## 2012-02-01 NOTE — Patient Instructions (Addendum)
Take fluconazole, may repeat x 1 if necessary.  Call if symptoms not improving further .  Follow up with primary MD  In next 2-3 weeks if abdominal soreness does not resolve completely.

## 2012-02-01 NOTE — Assessment & Plan Note (Signed)
Neg wet prep, but may be candidiasis at external areas. Will treat with difuclan.  irritant vaginitis also a possibility but pt unaware of new exposures.

## 2012-02-01 NOTE — Progress Notes (Signed)
  Subjective:    Patient ID: Penny Gardner, female    DOB: September 09, 1955, 57 y.o.   MRN: [id number]  HPI  57 year old female presents following recent diverticulitis treated with flagyl and cipro with vaginal itching. She was initially diagnosed with vaginal candadiasis and given diflucan. He symptoms resolved.  She then had allergic reaction to antibiotics and was given prednisone.  Since then her vaginal symptoms have recurred.   She reports  Exterior vaginal itching around clitoris and at hairline of mons.  No vaginal discharge. No vagina itching in interior of vagina  Review of Systems  Constitutional: Negative for fever and fatigue.  HENT: Negative for ear pain.   Eyes: Negative for pain.  Respiratory: Negative for chest tightness and shortness of breath.   Cardiovascular: Negative for chest pain, palpitations and leg swelling.  Gastrointestinal: Negative for abdominal pain.  Genitourinary: Negative for dysuria, urgency, decreased urine volume, vaginal bleeding, vaginal discharge and vaginal pain.       Objective:   Physical Exam  Constitutional: She appears well-developed and well-nourished.  Abdominal: Soft. Bowel sounds are normal. There is no tenderness.  Genitourinary: There is erythema around the vagina. No tenderness or bleeding around the vagina. No foreign body around the vagina. No vaginal discharge found.       Swabbed clitoral hood labia sides as well as vaginal canal          Assessment & Plan:

## 2012-03-19 ENCOUNTER — Encounter: Payer: Self-pay | Admitting: Internal Medicine

## 2012-11-21 ENCOUNTER — Encounter: Payer: Self-pay | Admitting: Internal Medicine

## 2012-12-08 ENCOUNTER — Encounter: Payer: Self-pay | Admitting: Family Medicine

## 2012-12-08 ENCOUNTER — Ambulatory Visit (INDEPENDENT_AMBULATORY_CARE_PROVIDER_SITE_OTHER): Payer: BC Managed Care – PPO | Admitting: Family Medicine

## 2012-12-08 VITALS — BP 120/80 | HR 97 | Temp 98.4°F | Ht 65.0 in | Wt 178.8 lb

## 2012-12-08 DIAGNOSIS — J189 Pneumonia, unspecified organism: Secondary | ICD-10-CM

## 2012-12-08 MED ORDER — AZITHROMYCIN 250 MG PO TABS: ORAL_TABLET | ORAL | Status: DC

## 2012-12-08 MED ORDER — HYDROCODONE-HOMATROPINE 5-1.5 MG/5ML PO SYRP: ORAL_SOLUTION | ORAL | Status: DC

## 2012-12-08 NOTE — Progress Notes (Signed)
Pre-visit discussion using our clinic review tool. No additional management support is needed unless otherwise documented below in the visit note.  

## 2012-12-08 NOTE — Progress Notes (Signed)
Date:  12/08/2012   Name:  Penny Gardner   DOB:  [birth year]   MRN:  [id number] Gender: female Age: 57 y.o.  Primary Physician:  Ruthe Mannan, MD   Chief Complaint: Cough, Sore on Tongue and Head Congestion   Subjective:   History of Present Illness:  Penny Gardner is a 57 y.o. very pleasant female patient who presents with the following:  Day 14 of wheezing and cough. Works in an [employer] school. In her head, has an ulcer on her chest. Wants to sleep. Coughing all the time. Feels fairly poorly much of the time right now. Coughing up yellow/green sputum. Some nasal congestion that has thickened.  Past Medical History, Surgical History, Social History, Family History, Problem List, Medications, and Allergies have been reviewed and updated if relevant.  Review of Systems: ROS: GEN: Acute illness details above GI: Tolerating PO intake GU: maintaining adequate hydration and urination Pulm: No SOB Interactive and getting along well at home.  Otherwise, ROS is as per the HPI.   Objective:   Physical Examination: BP 120/80  Pulse 97  Temp(Src) 98.4 F (36.9 C) (Oral)  Ht 5\' 5"  (1.651 m)  Wt 178 lb 12 oz (81.08 kg)  BMI 29.75 kg/m2  SpO2 97%   GEN: A and O x 3. WDWN. NAD.    ENT: Nose clear, ext NML.  No LAD.  No JVD.  TM's clear. Oropharynx clear. Mild maxillary tenderness PULM: Normal WOB, no distress. No crackles, rare wheezes, no rhonchi. CV: RRR, no M/G/R, No rubs, No JVD.   EXT: warm and well-perfused, No c/c/e. PSYCH: Pleasant and conversant.    Laboratory and Imaging Data:  Assessment & Plan:    Walking pneumonia  discussed plan of care. Given length of symptoms and overall history, will treat with ABX in this case. Continue with additional supportive care, cough medications, liquids, sleep, steam / vaporizer.   Orders Today:  No orders of the defined types were placed in this encounter.    New medications, updates to list, dose  adjustments: Meds ordered this encounter  Medications  . Ibuprofen 200 MG CAPS    Sig: Take 400 mg by mouth every 5 (five) hours as needed.  . loratadine-pseudoephedrine (CLARITIN-D 12-HOUR) 5-120 MG per tablet    Sig: Take 1 tablet by mouth 2 (two) times daily.  Marland Kitchen azithromycin (ZITHROMAX Z-PAK) 250 MG tablet    Sig: Take 2 tablets (500 mg) on  Day 1,  followed by 1 tablet (250 mg) once daily on Days 2 through 5.    Dispense:  6 each    Refill:  0  . HYDROcodone-homatropine (HYCODAN) 5-1.5 MG/5ML syrup    Sig: 1 tsp po at night before bed prn cough    Dispense:  240 mL    Refill:  0    Signed,  Irlene Crudup T. Arletha Marschke, MD, CAQ Sports Medicine  [facility] at [facility] [address] [place] [address] Phone: [phone] Fax: [phone]  Updated Complete Medication List:   Medication List       This list is accurate as of: 12/08/12 11:59 PM.  Always use your most recent med list.               azithromycin 250 MG tablet  Commonly known as:  ZITHROMAX Z-PAK  Take 2 tablets (500 mg) on  Day 1,  followed by 1 tablet (250 mg) once daily on Days 2 through 5.     HYDROcodone-homatropine  5-1.5 MG/5ML syrup  Commonly known as:  HYCODAN  1 tsp po at night before bed prn cough     Ibuprofen 200 MG Caps  Take 400 mg by mouth every 5 (five) hours as needed.     loratadine 10 MG tablet  Commonly known as:  CLARITIN  Take 10 mg by mouth daily.     loratadine-pseudoephedrine 5-120 MG per tablet  Commonly known as:  CLARITIN-D 12-hour  Take 1 tablet by mouth 2 (two) times daily.     omeprazole 20 MG capsule  Commonly known as:  PRILOSEC  Take 20 mg by mouth daily.

## 2013-02-17 ENCOUNTER — Telehealth: Payer: Self-pay | Admitting: Family Medicine

## 2013-02-17 NOTE — Telephone Encounter (Signed)
Attempted to call pt at all 3 phone numbers listed.  No answer.  Left messages to call our office back.

## 2013-02-17 NOTE — Telephone Encounter (Signed)
Pt called with "breathing difficulty." Message left to call back on voice mail.

## 2013-02-18 ENCOUNTER — Ambulatory Visit (INDEPENDENT_AMBULATORY_CARE_PROVIDER_SITE_OTHER)
Admission: RE | Admit: 2013-02-18 | Discharge: 2013-02-18 | Disposition: A | Discharge status: Home / Self Care | Payer: BC Managed Care – PPO | Source: Ambulatory Visit | Attending: Family Medicine | Admitting: Family Medicine

## 2013-02-18 ENCOUNTER — Ambulatory Visit (INDEPENDENT_AMBULATORY_CARE_PROVIDER_SITE_OTHER): Payer: BC Managed Care – PPO | Admitting: Family Medicine

## 2013-02-18 ENCOUNTER — Encounter: Payer: Self-pay | Admitting: Family Medicine

## 2013-02-18 VITALS — BP 132/78 | HR 87 | Temp 97.8°F | Ht 64.75 in | Wt 173.5 lb

## 2013-02-18 DIAGNOSIS — R0602 Shortness of breath: Secondary | ICD-10-CM

## 2013-02-18 IMAGING — CR DG CHEST 2V
2 series · 2 of 2 positions shown · non-contrast
Comparison: [DATE]

CLINICAL DATA: Shortness of Breath

EXAM:
CHEST  2 VIEW

[view not recorded (1 of 2)]
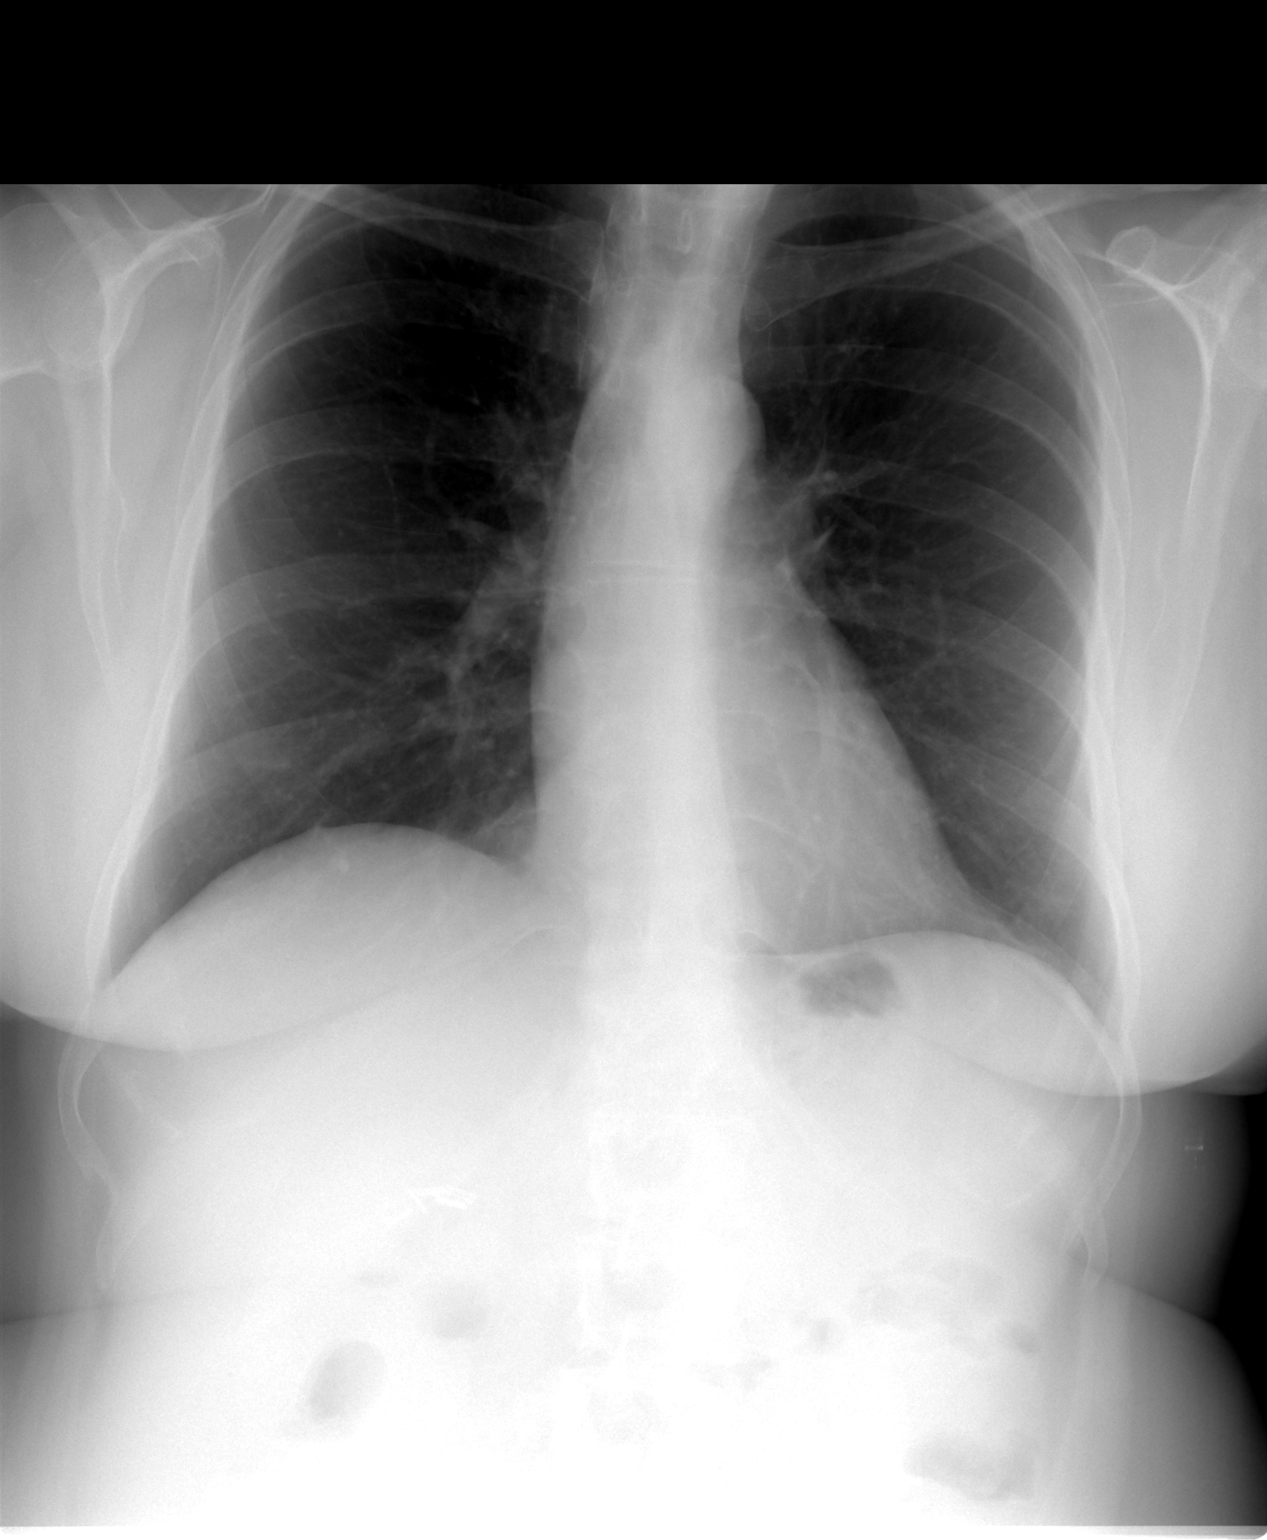

[view not recorded (2 of 2)]
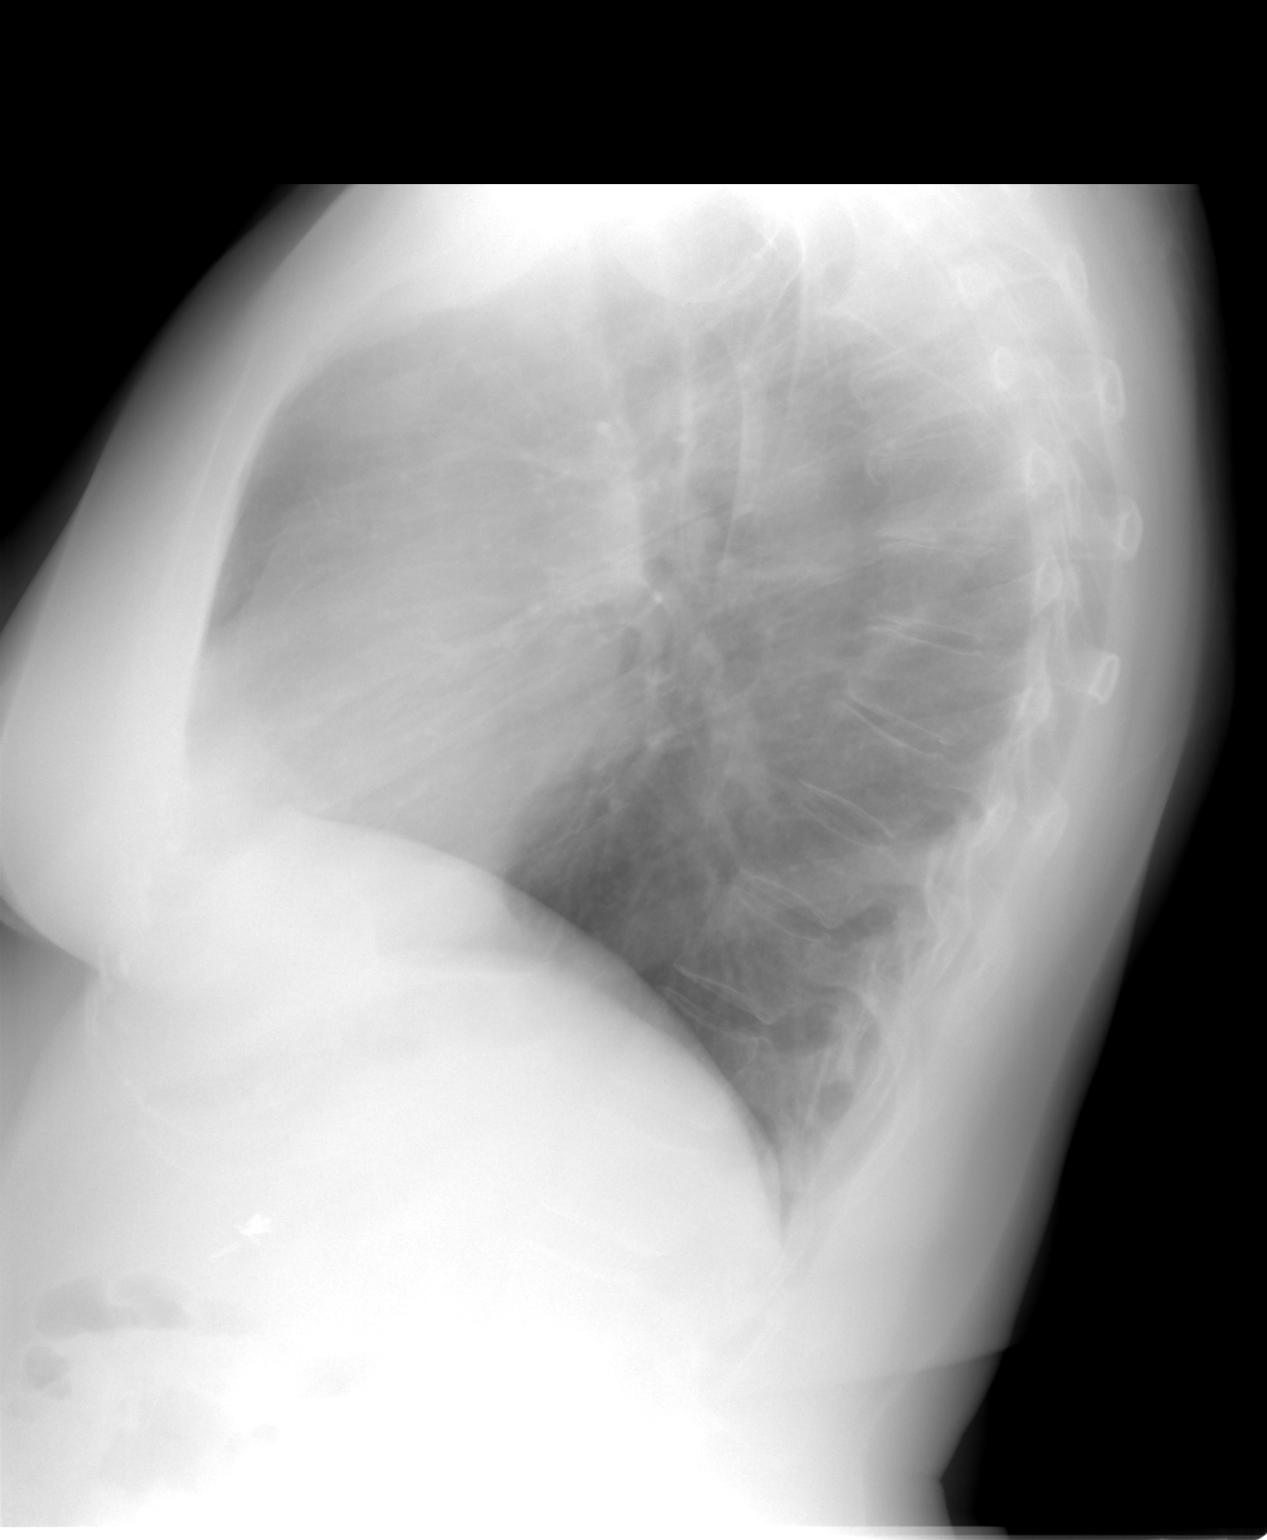

[2 of 2 positions shown; findings below may reference images not displayed]

FINDINGS: Cardiomediastinal silhouette is stable. Mild thoracic
dextroscoliosis. No acute infiltrate or pleural effusion. No
pulmonary edema. Mild degenerative changes thoracic spine.
IMPRESSION: No active cardiopulmonary disease.

## 2013-02-18 MED ORDER — PREDNISONE 10 MG PO TABS: ORAL_TABLET | ORAL | Status: DC

## 2013-02-18 NOTE — Patient Instructions (Signed)
Good to see you. Please take prednisone as directed- in the morning and with food. We will call you with your xray results.

## 2013-02-18 NOTE — Progress Notes (Signed)
Subjective:   Patient ID: Penny Gardner, female    DOB: 1955/04/17, 58 y.o.   MRN: [id number]  Penny Gardner is a pleasant 58 y.o. year old female who presents to clinic today with Shortness of Breath  on 02/18/2013  HPI: Treated for walking PNA on 12/1 by Dr. Lorelei Pont- given Zpack.  Cough has persisted since then.  NO fevers or chills.  She does have DOE.  Walks everynight and has to stop to catch her breath.  Never had this problem before.  Feels like she is wheezing. No fevers. No chest pain.  Cough is productive but she cannot get sputum out.  Non smoker- has never smoked.  Patient Active Problem List   Diagnosis Date Noted  . Shortness of breath 02/18/2013  . Allergic drug reaction 01/15/2012  . Vaginal candidiasis 01/15/2012  . Diverticulitis of sigmoid colon 01/08/2012  . Routine general medical examination at a health care facility 08/02/2011  . 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 Cerumen impaction 08/02/2011  . SUBCONJUNCTIVAL HEMORRHAGE, LEFT 07/22/2009  . PLANTAR FASCIITIS, BILATERAL 10/01/2007  . COLONIC POLYPS 03/11/2007  . DIVERTICULOSIS, COLON 03/11/2007  . CERUMEN IMPACTION, BILATERAL 10/21/2006  . ASCUS PAP 01/08/2005  . ESOPHAGEAL STRICTURE 10/08/2004  . GERD 10/08/2004  . HIATAL HERNIA 10/08/2004   Past Medical History  Diagnosis Date  . Papanicolaou smear of cervix with atypical squamous cells of undetermined significance (ASC-US)     colposcopy 3/07, ASCUS  . Impacted cerumen   . Benign neoplasm of colon   . Diverticulosis of colon (without mention of hemorrhage)     colonoscopy 8/02, 03/11/07 abnormal  . Stricture and stenosis of esophagus   . Esophageal reflux     EGD positive 10/06  . Hiatal hernia   . Postmenopausal HRT (hormone replacement therapy)   . Plantar fascial fibromatosis   . Conjunctival hemorrhage   . Routine general medical examination at a health care facility    Past Surgical History  Procedure Laterality Date  .  Cesarean section      x 2  . Ethmoidectomy     History  Substance Use Topics  . Smoking status: Never Smoker   . Smokeless tobacco: Never Used  . Alcohol Use: Yes     Comment: rare   Family History  Problem Relation Age of Onset  . Cancer Mother     colon  . Diabetes Father   . Cancer Maternal Uncle     colon  . Diabetes Paternal Grandmother    Allergies  Allergen Reactions  . Cefaclor     REACTION: itching   Current Outpatient Prescriptions on File Prior to Visit  Medication Sig Dispense Refill  . Ibuprofen 200 MG CAPS Take 400 mg by mouth every 5 (five) hours as needed.      . loratadine (CLARITIN) 10 MG tablet Take 10 mg by mouth daily.      Marland Kitchen loratadine-pseudoephedrine (CLARITIN-D 12-HOUR) 5-120 MG per tablet Take 1 tablet by mouth 2 (two) times daily.      Marland Kitchen omeprazole (PRILOSEC) 20 MG capsule Take 20 mg by mouth daily.      Marland Kitchen HYDROcodone-homatropine (HYCODAN) 5-1.5 MG/5ML syrup 1 tsp po at night before bed prn cough  240 mL  0   No current facility-administered medications on file prior to visit.   The PMH, PSH, Social History, Family History, Medications, and allergies have been reviewed in [facility], and have been updated if relevant.   Review of Systems See  HPI No rashes NO n/v/d    Objective:    BP 132/78  Pulse 87  Temp(Src) 97.8 F (36.6 C) (Oral)  Ht 5' 4.75" (1.645 m)  Wt 173 lb 8 oz (78.699 kg)  BMI 29.08 kg/m2  SpO2 97%   Physical Exam  Nursing note and vitals reviewed. Constitutional: She is oriented to person, place, and time. She appears well-developed and well-nourished. No distress.  HENT:  Head: Normocephalic.  Eyes: Pupils are equal, round, and reactive to light.  Neck: Normal range of motion.  Cardiovascular: Normal rate and regular rhythm.   Pulmonary/Chest: Effort normal and breath sounds normal.  Abdominal: Soft. Bowel sounds are normal.  Neurological: She is alert and oriented to person, place, and time.  Skin: Skin is warm and  dry.          Assessment & Plan:   Shortness of breath - Plan: DG Chest 2 View No Follow-up on file.

## 2013-02-18 NOTE — Assessment & Plan Note (Signed)
Exam and pulse ox reassuring. Will give short course of prednisone to help with inflammation and get CXR today for further evaluation. If symptoms persist, consider PFTs. The patient indicates understanding of these issues and agrees with the plan.

## 2013-02-18 NOTE — Progress Notes (Signed)
Pre-visit discussion using our clinic review tool. No additional management support is needed unless otherwise documented below in the visit note.  

## 2013-06-29 ENCOUNTER — Encounter: Payer: Self-pay | Admitting: Family Medicine

## 2013-06-29 ENCOUNTER — Ambulatory Visit (INDEPENDENT_AMBULATORY_CARE_PROVIDER_SITE_OTHER): Payer: BC Managed Care – PPO | Admitting: Family Medicine

## 2013-06-29 VITALS — BP 128/80 | HR 98 | Temp 98.1°F | Ht 65.0 in | Wt 174.8 lb

## 2013-06-29 DIAGNOSIS — H6120 Impacted cerumen, unspecified ear: Secondary | ICD-10-CM

## 2013-06-29 DIAGNOSIS — J019 Acute sinusitis, unspecified: Secondary | ICD-10-CM | POA: Insufficient documentation

## 2013-06-29 DIAGNOSIS — J069 Acute upper respiratory infection, unspecified: Secondary | ICD-10-CM | POA: Insufficient documentation

## 2013-06-29 DIAGNOSIS — H6121 Impacted cerumen, right ear: Secondary | ICD-10-CM

## 2013-06-29 MED ORDER — HYDROCOD POLST-CHLORPHEN POLST 10-8 MG/5ML PO LQCR: 5.0000 mL | Freq: Every evening | ORAL | Status: DC | PRN

## 2013-06-29 MED ORDER — AZITHROMYCIN 250 MG PO TABS: ORAL_TABLET | ORAL | Status: DC

## 2013-06-29 NOTE — Assessment & Plan Note (Signed)
Ceruminosis is noted.  Wax is removed by syringing and manual debridement. Instructions for home care to prevent wax buildup are given.  

## 2013-06-29 NOTE — Progress Notes (Signed)
Pre visit review using our clinic review tool, if applicable. No additional management support is needed unless otherwise documented below in the visit note. 

## 2013-06-29 NOTE — Progress Notes (Signed)
SUBJECTIVE:  Penny Gardner is a 58 y.o. female who complains of coryza, congestion, sore throat, swollen glands and dry cough for 8 days. She denies a history of anorexia, chest pain and shortness of breath and denies a history of asthma. Patient denies smoke cigarettes.  Was treated for walking PNA in 12/2012.  Then I saw her in 02/2013 with chest congestion- course of prednisone did help.  CXR in 02/2013 was neg.  She wants to know why she gets this so frequently. Current Outpatient Prescriptions on File Prior to Visit  Medication Sig Dispense Refill  . Ibuprofen 200 MG CAPS Take 400 mg by mouth every 5 (five) hours as needed.      . loratadine (CLARITIN) 10 MG tablet Take 10 mg by mouth daily.      Marland Kitchen omeprazole (PRILOSEC) 20 MG capsule Take 20 mg by mouth daily.       No current facility-administered medications on file prior to visit.    Allergies  Allergen Reactions  . Cefaclor     REACTION: itching    Past Medical History  Diagnosis Date  . Papanicolaou smear of cervix with atypical squamous cells of undetermined significance (ASC-US)     colposcopy 3/07, ASCUS  . Impacted cerumen   . Benign neoplasm of colon   . Diverticulosis of colon (without mention of hemorrhage)     colonoscopy 8/02, 03/11/07 abnormal  . Stricture and stenosis of esophagus   . Esophageal reflux     EGD positive 10/06  . Hiatal hernia   . Postmenopausal HRT (hormone replacement therapy)   . Plantar fascial fibromatosis   . Conjunctival hemorrhage   . Routine general medical examination at a health care facility     Past Surgical History  Procedure Laterality Date  . Cesarean section      x 2  . Ethmoidectomy      Family History  Problem Relation Age of Onset  . Cancer Mother     colon  . Diabetes Father   . Cancer Maternal Uncle     colon  . Diabetes Paternal Grandmother     History   Social History  . Marital Status: Married    Spouse Name: N/A    Number of Children: N/A   . Years of Education: N/A   Occupational History  . Not on file.   Social History Main Topics  . Smoking status: Never Smoker   . Smokeless tobacco: Never Used  . Alcohol Use: Yes     Comment: rare  . Drug Use: No  . Sexual Activity: Not on file   Other Topics Concern  . Not on file   Social History Narrative  . No narrative on file   The PMH, PSH, Social History, Family History, Medications, and allergies have been reviewed in [facility], and have been updated if relevant.    OBJECTIVE: BP 128/80  Pulse 98  Temp(Src) 98.1 F (36.7 C) (Oral)  Ht 5\' 5"  (1.651 m)  Wt 174 lb 12 oz (79.266 kg)  BMI 29.08 kg/m2  SpO2 97%  She appears well, vital signs are as noted. Ears normal.  Throat and pharynx erythematous.  Neck supple. No adenopathy in the neck. Nose is congested. Sinuses non tender.  Right ear cerumen impaction. The chest is clear, without wheezes or rales.

## 2013-06-29 NOTE — Assessment & Plan Note (Signed)
Given duration and progression of symptoms, will treat for bacterial sinusitis. Zpack Tussionex as needed at bedtime.

## 2013-07-23 ENCOUNTER — Other Ambulatory Visit: Payer: Self-pay | Admitting: Family Medicine

## 2013-07-23 DIAGNOSIS — Z136 Encounter for screening for cardiovascular disorders: Secondary | ICD-10-CM

## 2013-07-23 DIAGNOSIS — Z0000 Encounter for general adult medical examination without abnormal findings: Secondary | ICD-10-CM

## 2013-07-30 ENCOUNTER — Other Ambulatory Visit (INDEPENDENT_AMBULATORY_CARE_PROVIDER_SITE_OTHER): Payer: BC Managed Care – PPO

## 2013-07-30 DIAGNOSIS — Z0000 Encounter for general adult medical examination without abnormal findings: Secondary | ICD-10-CM

## 2013-07-30 DIAGNOSIS — Z136 Encounter for screening for cardiovascular disorders: Secondary | ICD-10-CM

## 2013-07-30 LAB — CBC WITH DIFFERENTIAL/PLATELET
Basophils Absolute: 0 10*3/uL (ref 0.0–0.1)
Basophils Relative: 0.6 % (ref 0.0–3.0)
EOS PCT: 2 % (ref 0.0–5.0)
Eosinophils Absolute: 0.1 10*3/uL (ref 0.0–0.7)
HEMATOCRIT: 40.6 % (ref 36.0–46.0)
HEMOGLOBIN: 13.6 g/dL (ref 12.0–15.0)
LYMPHS ABS: 2.3 10*3/uL (ref 0.7–4.0)
LYMPHS PCT: 32.9 % (ref 12.0–46.0)
MCHC: 33.5 g/dL (ref 30.0–36.0)
MCV: 90.1 fl (ref 78.0–100.0)
MONOS PCT: 5.5 % (ref 3.0–12.0)
Monocytes Absolute: 0.4 10*3/uL (ref 0.1–1.0)
NEUTROS ABS: 4 10*3/uL (ref 1.4–7.7)
Neutrophils Relative %: 59 % (ref 43.0–77.0)
Platelets: 213 10*3/uL (ref 150.0–400.0)
RBC: 4.51 Mil/uL (ref 3.87–5.11)
RDW: 12.7 % (ref 11.5–15.5)
WBC: 6.8 10*3/uL (ref 4.0–10.5)

## 2013-07-30 LAB — COMPREHENSIVE METABOLIC PANEL
ALT: 31 U/L (ref 0–35)
AST: 22 U/L (ref 0–37)
Albumin: 4 g/dL (ref 3.5–5.2)
Alkaline Phosphatase: 70 U/L (ref 39–117)
BILIRUBIN TOTAL: 0.9 mg/dL (ref 0.2–1.2)
BUN: 18 mg/dL (ref 6–23)
CALCIUM: 9.1 mg/dL (ref 8.4–10.5)
CHLORIDE: 106 meq/L (ref 96–112)
CO2: 29 meq/L (ref 19–32)
CREATININE: 0.9 mg/dL (ref 0.4–1.2)
GFR: 71.8 mL/min (ref >60.00)
GLUCOSE: 100 mg/dL — AB (ref 70–99)
Potassium: 4.3 mEq/L (ref 3.5–5.1)
SODIUM: 140 meq/L (ref 135–145)
TOTAL PROTEIN: 7.5 g/dL (ref 6.0–8.3)

## 2013-07-30 LAB — LIPID PANEL
CHOLESTEROL: 233 mg/dL — AB (ref 0–200)
HDL: 49 mg/dL (ref >39.00)
LDL Cholesterol: 159 mg/dL — ABNORMAL HIGH (ref 0–99)
NONHDL: 184
Total CHOL/HDL Ratio: 5
Triglycerides: 126 mg/dL (ref 0.0–149.0)
VLDL: 25.2 mg/dL (ref 0.0–40.0)

## 2013-07-30 LAB — TSH: TSH: 1.77 u[IU]/mL (ref 0.35–4.50)

## 2013-08-04 ENCOUNTER — Other Ambulatory Visit (HOSPITAL_COMMUNITY)
Admission: RE | Admit: 2013-08-04 | Discharge: 2013-08-04 | Disposition: A | Discharge status: Home / Self Care | Payer: BC Managed Care – PPO | Source: Ambulatory Visit | Attending: Family Medicine | Admitting: Family Medicine

## 2013-08-04 ENCOUNTER — Ambulatory Visit (INDEPENDENT_AMBULATORY_CARE_PROVIDER_SITE_OTHER): Payer: BC Managed Care – PPO | Admitting: Family Medicine

## 2013-08-04 ENCOUNTER — Encounter: Payer: Self-pay | Admitting: Family Medicine

## 2013-08-04 VITALS — BP 116/72 | HR 80 | Temp 98.1°F | Ht 65.5 in | Wt 177.2 lb

## 2013-08-04 DIAGNOSIS — Z0000 Encounter for general adult medical examination without abnormal findings: Secondary | ICD-10-CM

## 2013-08-04 DIAGNOSIS — E785 Hyperlipidemia, unspecified: Secondary | ICD-10-CM | POA: Insufficient documentation

## 2013-08-04 DIAGNOSIS — Z131 Encounter for screening for diabetes mellitus: Secondary | ICD-10-CM | POA: Insufficient documentation

## 2013-08-04 DIAGNOSIS — Z1151 Encounter for screening for human papillomavirus (HPV): Secondary | ICD-10-CM | POA: Insufficient documentation

## 2013-08-04 DIAGNOSIS — Z1231 Encounter for screening mammogram for malignant neoplasm of breast: Secondary | ICD-10-CM

## 2013-08-04 DIAGNOSIS — R05 Cough: Secondary | ICD-10-CM

## 2013-08-04 DIAGNOSIS — Z01419 Encounter for gynecological examination (general) (routine) without abnormal findings: Secondary | ICD-10-CM | POA: Insufficient documentation

## 2013-08-04 DIAGNOSIS — K219 Gastro-esophageal reflux disease without esophagitis: Secondary | ICD-10-CM

## 2013-08-04 DIAGNOSIS — R059 Cough, unspecified: Secondary | ICD-10-CM

## 2013-08-04 MED ORDER — PREDNISONE 10 MG PO TABS: ORAL_TABLET | ORAL | Status: DC

## 2013-08-04 MED ORDER — OMEPRAZOLE 20 MG PO CPDR: 20.0000 mg | DELAYED_RELEASE_CAPSULE | Freq: Every day | ORAL | Status: DC

## 2013-08-04 MED ORDER — ALBUTEROL SULFATE HFA 108 (90 BASE) MCG/ACT IN AERS: 2.0000 | INHALATION_SPRAY | Freq: Four times a day (QID) | RESPIRATORY_TRACT | Status: DC | PRN

## 2013-08-04 MED ORDER — MONTELUKAST SODIUM 10 MG PO TABS: 10.0000 mg | ORAL_TABLET | Freq: Every day | ORAL | Status: DC

## 2013-08-04 NOTE — Patient Instructions (Addendum)
Great to see you. Please call to schedule your mammogram.  Take prednisone as directed. Start singulair. Proair inhaler as needed.   Please work on your diet- follow low cholesterol hand out.

## 2013-08-04 NOTE — Assessment & Plan Note (Signed)
?  RAD/allergic bronchitis. Start Singular, prn alubuterol, course of prednisone (eRxs sent). Call or return to clinic prn if these symptoms worsen or fail to improve as anticipated. The patient indicates understanding of these issues and agrees with the plan.

## 2013-08-04 NOTE — Addendum Note (Signed)
Addended by: Modena Nunnery on: 08/04/2013 08:04 AM   Modules accepted: Orders

## 2013-08-04 NOTE — Assessment & Plan Note (Signed)
Pap smear today. Mammogram ordered.

## 2013-08-04 NOTE — Assessment & Plan Note (Signed)
Stable on current dose of omeprazole. No changes- eRx sent to pharmacy.

## 2013-08-04 NOTE — Progress Notes (Signed)
Subjective:    Patient ID: Penny Gardner, female    DOB: 1955/12/24, 58 y.o.   MRN: [id number]  HPI  58 yo pleasant female here for CPX.  Cough - persistent for weeks, now feels she wheezing.  No fevers.  Not a smoker.  Did have PNA this past winter.  Taking Claritin daily.   Mammogram 09/05/11 Colonoscopy Henrene Pastor) - 03/13/07- 5 year recall Colonoscopy 06/22/2010 (Dr. Earlean Shawl)- diverticulosis, polyps, 5 year recall.  Did have a diverticulitis flare last fall, no issues since.  Pap smear neg 08/18/09. No h/o post menopausal bleeding. No h/o abnormal pap smear in last 5 years.  Lab Results  Component Value Date   CHOL 233* 07/30/2013   HDL 49.00 07/30/2013   LDLCALC 159* 07/30/2013   LDLDIRECT 153.2 08/02/2011   TRIG 126.0 07/30/2013   CHOLHDL 5 07/30/2013   Lab Results  Component Value Date   CREATININE 0.9 07/30/2013   Lab Results  Component Value Date   WBC 6.8 07/30/2013   HGB 13.6 07/30/2013   HCT 40.6 07/30/2013   MCV 90.1 07/30/2013   PLT 213.0 07/30/2013     Patient Active Problem List   Diagnosis Date Noted  . HLD (hyperlipidemia) 08/04/2013  . Encounter for routine gynecological examination 08/04/2013  . Cough 08/04/2013  . Shortness of breath 02/18/2013  . Allergic drug reaction 01/15/2012  . Diverticulitis of sigmoid colon 01/08/2012  . Routine general medical examination at a health care facility 08/02/2011  . 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 COLONIC POLYPS 03/11/2007  . DIVERTICULOSIS, COLON 03/11/2007  . CERUMEN IMPACTION, BILATERAL 10/21/2006  . ASCUS PAP 01/08/2005  . ESOPHAGEAL STRICTURE 10/08/2004  . GERD 10/08/2004  . HIATAL HERNIA 10/08/2004   Past Medical History  Diagnosis Date  . Papanicolaou smear of cervix with atypical squamous cells of undetermined significance (ASC-US)     colposcopy 3/07, ASCUS  . Impacted cerumen   . Benign neoplasm of colon   . Diverticulosis of colon (without mention of hemorrhage)     colonoscopy 8/02, 03/11/07  abnormal  . Stricture and stenosis of esophagus   . Esophageal reflux     EGD positive 10/06  . Hiatal hernia   . Postmenopausal HRT (hormone replacement therapy)   . Plantar fascial fibromatosis   . Conjunctival hemorrhage   . Routine general medical examination at a health care facility    Past Surgical History  Procedure Laterality Date  . Cesarean section      x 2  . Ethmoidectomy     History  Substance Use Topics  . Smoking status: Never Smoker   . Smokeless tobacco: Never Used  . Alcohol Use: Yes     Comment: rare   Family History  Problem Relation Age of Onset  . Cancer Mother     colon  . Diabetes Father   . Cancer Maternal Uncle     colon  . Diabetes Paternal Grandmother    Allergies  Allergen Reactions  . Cefaclor     REACTION: itching   Current Outpatient Prescriptions on File Prior to Visit  Medication Sig Dispense Refill  . Ibuprofen 200 MG CAPS Take 400 mg by mouth every 5 (five) hours as needed.      . loratadine (CLARITIN) 10 MG tablet Take 10 mg by mouth daily.      Marland Kitchen omeprazole (PRILOSEC) 20 MG capsule Take 20 mg by mouth daily.       No current facility-administered medications on file prior to visit.  The PMH, PSH, Social History, Family History, Medications, and allergies have been reviewed in [facility], and have been updated if relevant.   Review of Systems See HPI Patient reports no  vision/ hearing changes,anorexia, weight change, fever ,adenopathy, persistant / recurrent hoarseness, swallowing issues, chest pain, edema,persistant,  hemoptysis, dyspnea(rest, exertional, paroxysmal nocturnal), gastrointestinal  bleeding (melena, rectal bleeding), abdominal pain, excessive heart burn, GU symptoms(dysuria, hematuria, pyuria, voiding/incontinence  Issues) syncope, focal weakness, severe memory loss, concerning skin lesions, depression, anxiety, abnormal bruising/bleeding, major joint swelling, breast masses or abnormal vaginal bleeding.        Objective:   Physical Exam  BP 116/72  Pulse 80  Temp(Src) 98.1 F (36.7 C) (Oral)  Ht 5' 5.5" (1.664 m)  Wt 177 lb 4 oz (80.4 kg)  BMI 29.04 kg/m2  SpO2 97% Wt Readings from Last 3 Encounters:  08/04/13 177 lb 4 oz (80.4 kg)  06/29/13 174 lb 12 oz (79.266 kg)  02/18/13 173 lb 8 oz (78.699 kg)    General:  Well-developed,well-nourished,in no acute distress; alert,appropriate and cooperative throughout examination Head:  normocephalic and atraumatic.   Eyes:  vision grossly intact, pupils equal, pupils round, and pupils reactive to light.   Ears:  R ear normal and L ear normal.   Nose:  no external deformity.   Mouth:  good dentition.   Neck:  No deformities, masses, or tenderness noted. Breasts:  No mass, nodules, thickening, tenderness, bulging, retraction, inflamation, nipple discharge or skin changes noted.   Lungs:  Normal respiratory effort, chest expands symmetrically.  Scattered bilateral expiratory wheezes Heart:  Normal rate and regular rhythm. S1 and S2 normal without gallop, murmur, click, rub or other extra sounds. Abdomen:  Bowel sounds positive,abdomen soft and non-tender without masses, organomegaly or hernias noted. Rectal:  no external abnormalities.   Genitalia:  Pelvic Exam:        External: normal female genitalia without lesions or masses        Vagina: normal without lesions or masses        Cervix: normal without lesions or masses        Adnexa: normal bimanual exam without masses or fullness        Uterus: normal by palpation        Pap smear: performed Msk:  No deformity or scoliosis noted of thoracic or lumbar spine.   Extremities:  No clubbing, cyanosis, edema, or deformity noted with normal full range of motion of all joints.   Neurologic:  alert & oriented X3 and gait normal.   Skin:  Intact without suspicious lesions or rashes Cervical Nodes:  No lymphadenopathy noted Axillary Nodes:  No palpable lymphadenopathy Psych:  Cognition and judgment  appear intact. Alert and cooperative with normal attention span and concentration. No apparent delusions, illusions, hallucinations       Assessment & Plan:

## 2013-08-04 NOTE — Progress Notes (Signed)
Pre visit review using our clinic review tool, if applicable. No additional management support is needed unless otherwise documented below in the visit note. 

## 2013-08-04 NOTE — Assessment & Plan Note (Signed)
Reviewed preventive care protocols, scheduled due services, and updated immunizations Discussed nutrition, exercise, diet, and healthy lifestyle.  

## 2013-08-04 NOTE — Assessment & Plan Note (Signed)
Deteriorated.  She prefers to work on diet first. Given low cholesterol hand out.  Follow up labs in 3 months. The patient indicates understanding of these issues and agrees with the plan.

## 2013-08-06 ENCOUNTER — Ambulatory Visit
Admission: RE | Admit: 2013-08-06 | Discharge: 2013-08-06 | Disposition: A | Discharge status: Home / Self Care | Payer: BC Managed Care – PPO | Source: Ambulatory Visit | Attending: Family Medicine | Admitting: Family Medicine

## 2013-08-06 DIAGNOSIS — Z1231 Encounter for screening mammogram for malignant neoplasm of breast: Secondary | ICD-10-CM

## 2013-08-06 IMAGING — MG MM SCREEN MAMMOGRAM BILATERAL
4 series · 4 of 4 positions shown · non-contrast
Comparison: Previous exam(s).

CLINICAL DATA: Screening.

EXAM:
DIGITAL SCREENING BILATERAL MAMMOGRAM WITH CAD

[R CC]
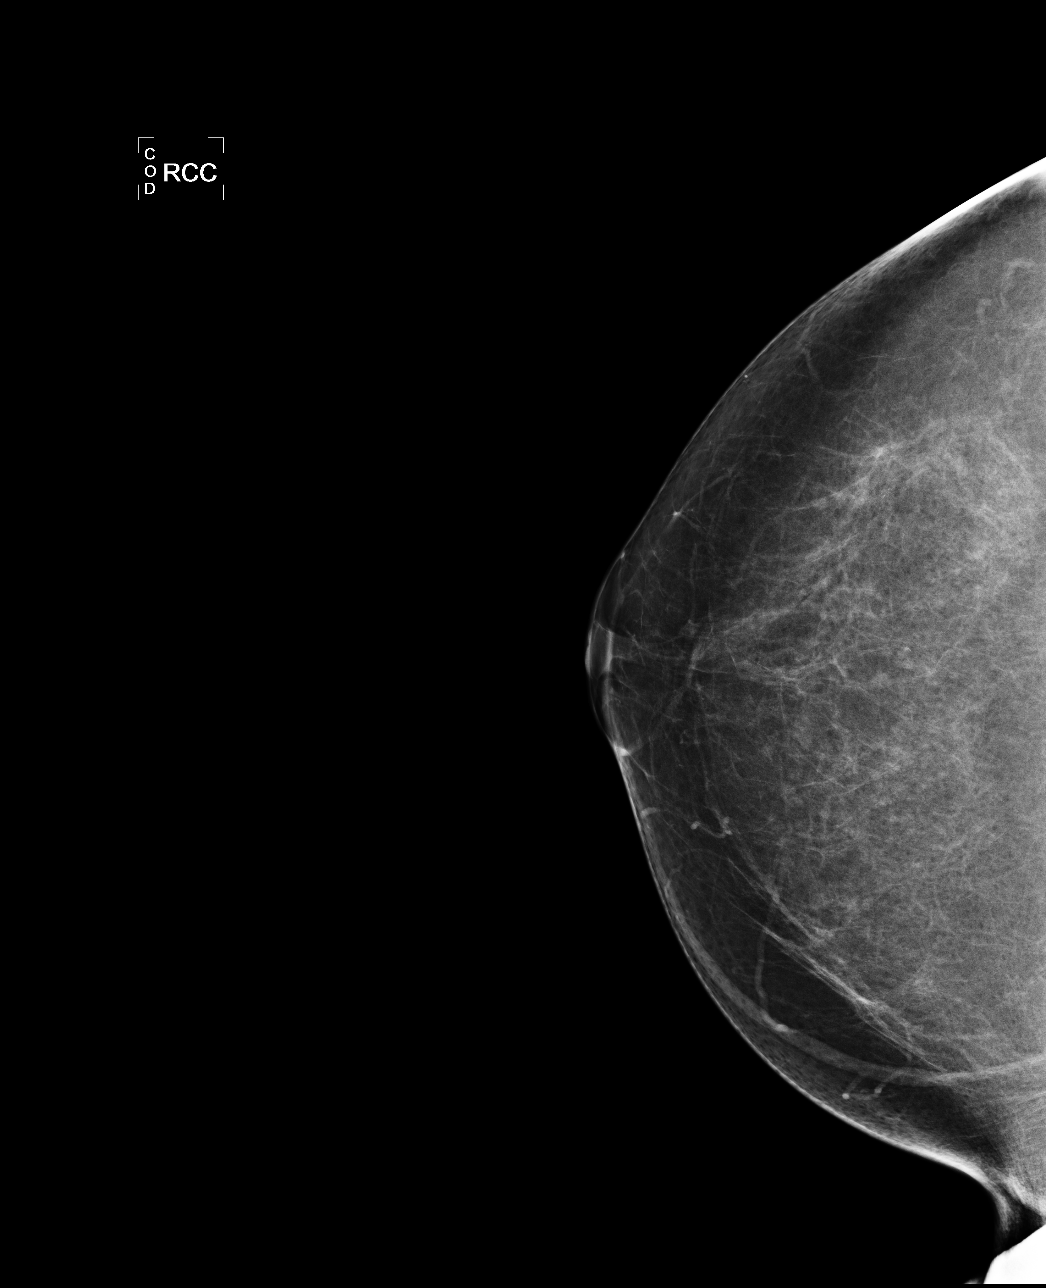

[L CC]
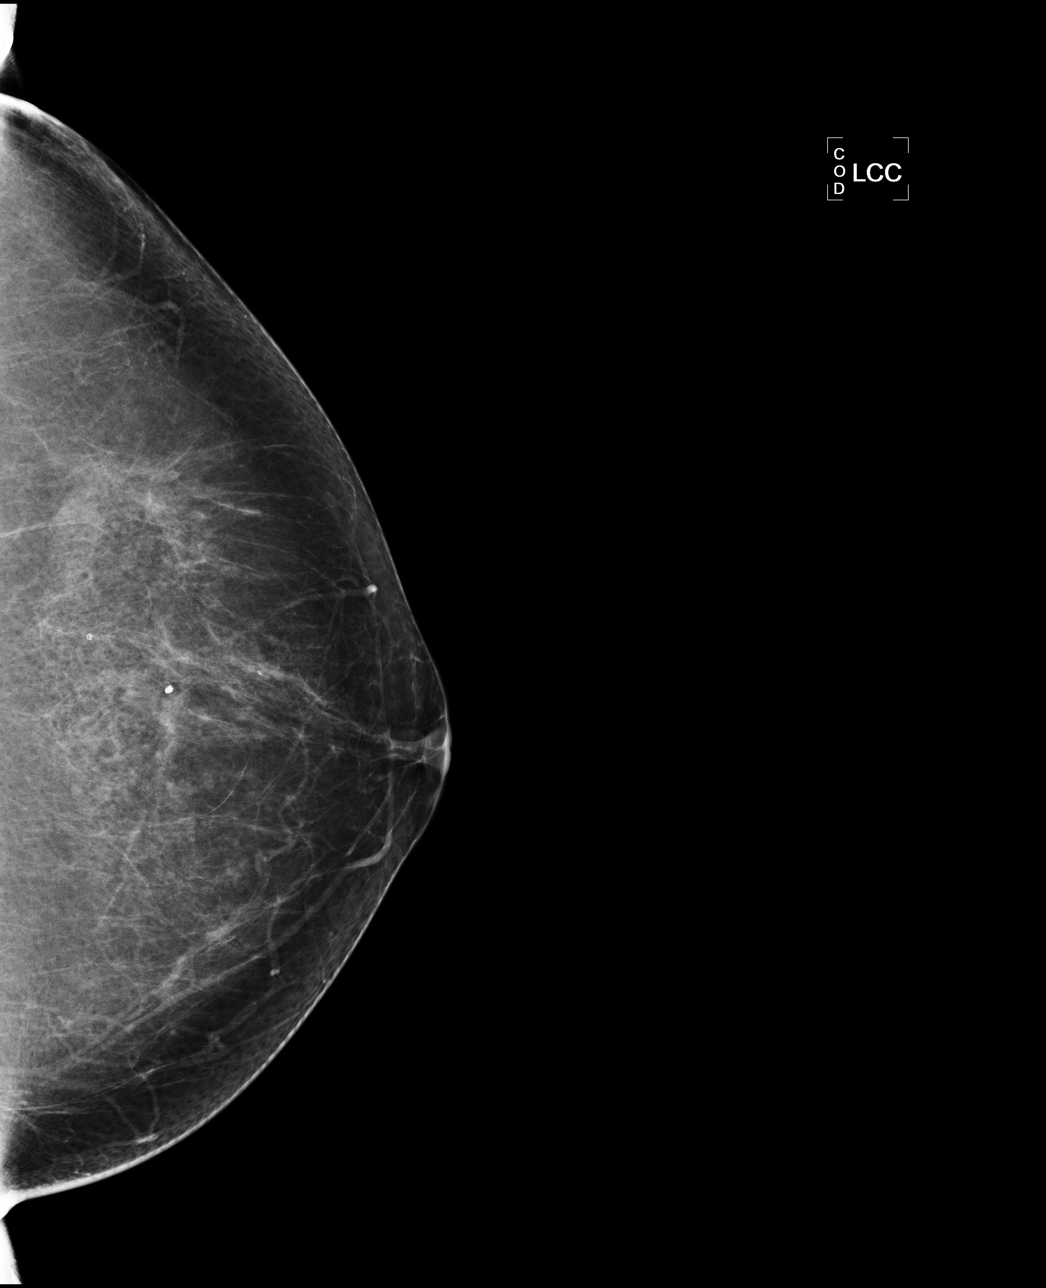

[L MLO]
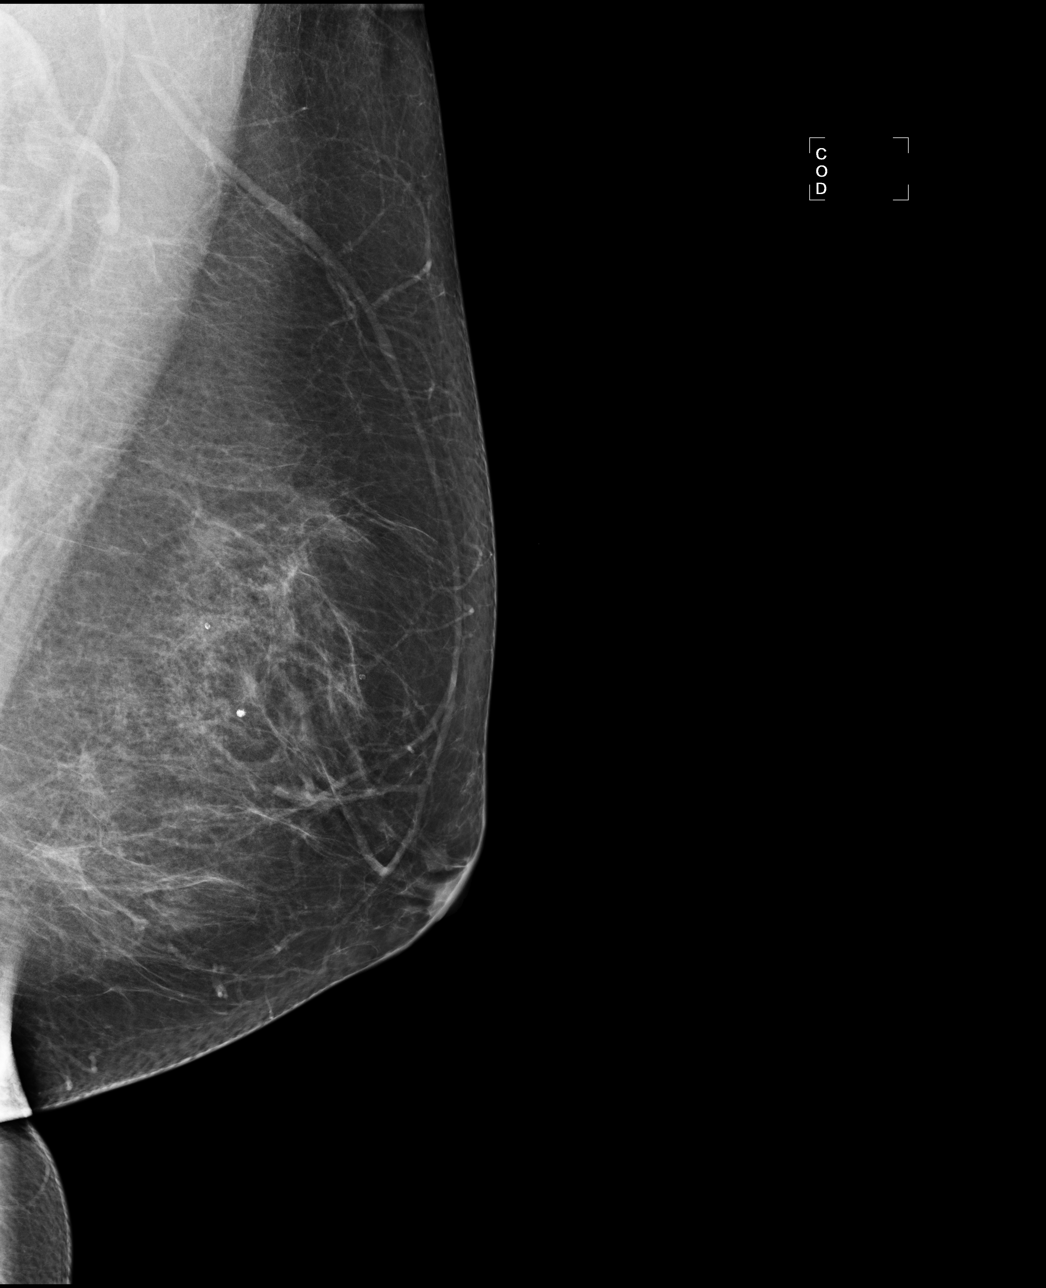

[R MLO]
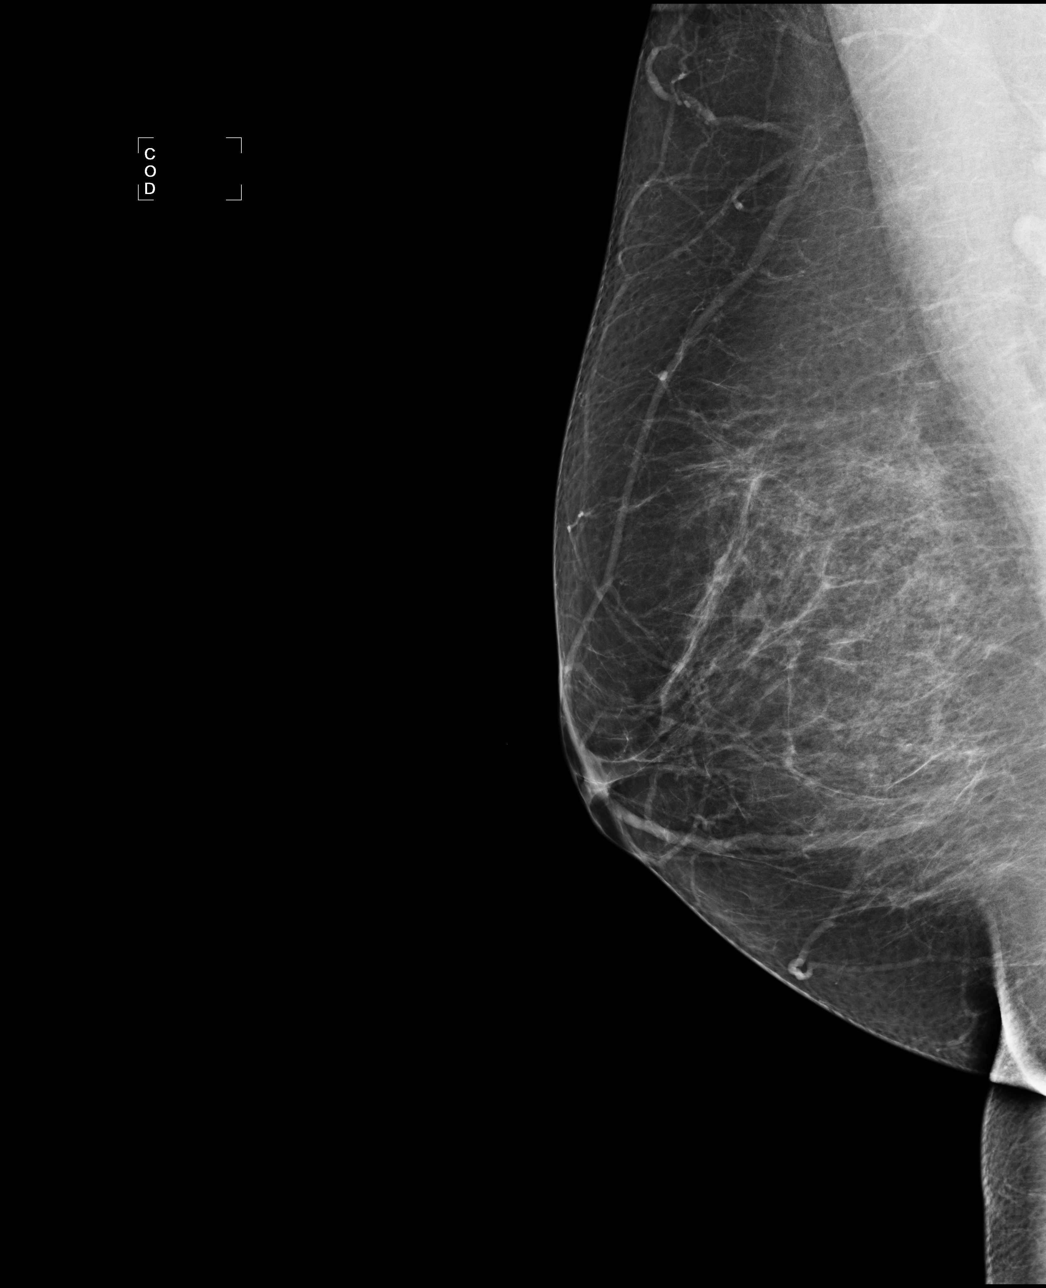

[4 of 4 positions shown; findings below may reference images not displayed]

ACR Breast Density Category b: There are scattered areas of
fibroglandular density.
FINDINGS: There are no findings suspicious for malignancy. Images were
processed with CAD.
IMPRESSION: No mammographic evidence of malignancy. A result letter of this
screening mammogram will be mailed directly to the patient.

RECOMMENDATION:
Screening mammogram in one year. (Code:[US])

BI-RADS CATEGORY  1: Negative.

## 2013-08-07 LAB — CYTOLOGY - PAP

## 2013-08-10 ENCOUNTER — Encounter: Payer: Self-pay | Admitting: *Deleted

## 2013-10-23 ENCOUNTER — Other Ambulatory Visit: Payer: Self-pay

## 2013-12-29 ENCOUNTER — Other Ambulatory Visit: Payer: Self-pay | Admitting: *Deleted

## 2013-12-29 MED ORDER — MONTELUKAST SODIUM 10 MG PO TABS: 10.0000 mg | ORAL_TABLET | Freq: Every day | ORAL | Status: DC

## 2014-01-18 ENCOUNTER — Other Ambulatory Visit: Payer: Self-pay | Admitting: Family Medicine

## 2014-05-08 ENCOUNTER — Other Ambulatory Visit: Payer: Self-pay | Admitting: Family Medicine

## 2014-06-14 ENCOUNTER — Other Ambulatory Visit: Payer: Self-pay | Admitting: Family Medicine

## 2014-07-11 ENCOUNTER — Other Ambulatory Visit: Payer: Self-pay | Admitting: Family Medicine

## 2014-08-19 ENCOUNTER — Other Ambulatory Visit: Payer: Self-pay | Admitting: Family Medicine

## 2014-08-23 ENCOUNTER — Ambulatory Visit: Payer: BC Managed Care – PPO | Admitting: Family Medicine

## 2014-08-25 ENCOUNTER — Encounter: Payer: Self-pay | Admitting: Family Medicine

## 2014-08-25 ENCOUNTER — Ambulatory Visit (INDEPENDENT_AMBULATORY_CARE_PROVIDER_SITE_OTHER)
Admission: RE | Admit: 2014-08-25 | Discharge: 2014-08-25 | Disposition: A | Discharge status: Home / Self Care | Payer: BC Managed Care – PPO | Source: Ambulatory Visit | Attending: Family Medicine | Admitting: Family Medicine

## 2014-08-25 ENCOUNTER — Ambulatory Visit (INDEPENDENT_AMBULATORY_CARE_PROVIDER_SITE_OTHER): Payer: BC Managed Care – PPO | Admitting: Family Medicine

## 2014-08-25 VITALS — BP 128/60 | HR 90 | Temp 98.1°F | Wt 182.5 lb

## 2014-08-25 DIAGNOSIS — M79642 Pain in left hand: Secondary | ICD-10-CM | POA: Insufficient documentation

## 2014-08-25 DIAGNOSIS — M6740 Ganglion, unspecified site: Secondary | ICD-10-CM | POA: Diagnosis not present

## 2014-08-25 IMAGING — CR DG HAND COMPLETE 3+V*L*
3 series · 3 of 3 positions shown · non-contrast
Comparison: None.

CLINICAL DATA: Left hand pain primarily at the fourth metacarpal
phalangeal joint for 1 month.

EXAM:
LEFT HAND - COMPLETE 3+ VIEW

[view not recorded (1 of 3)]
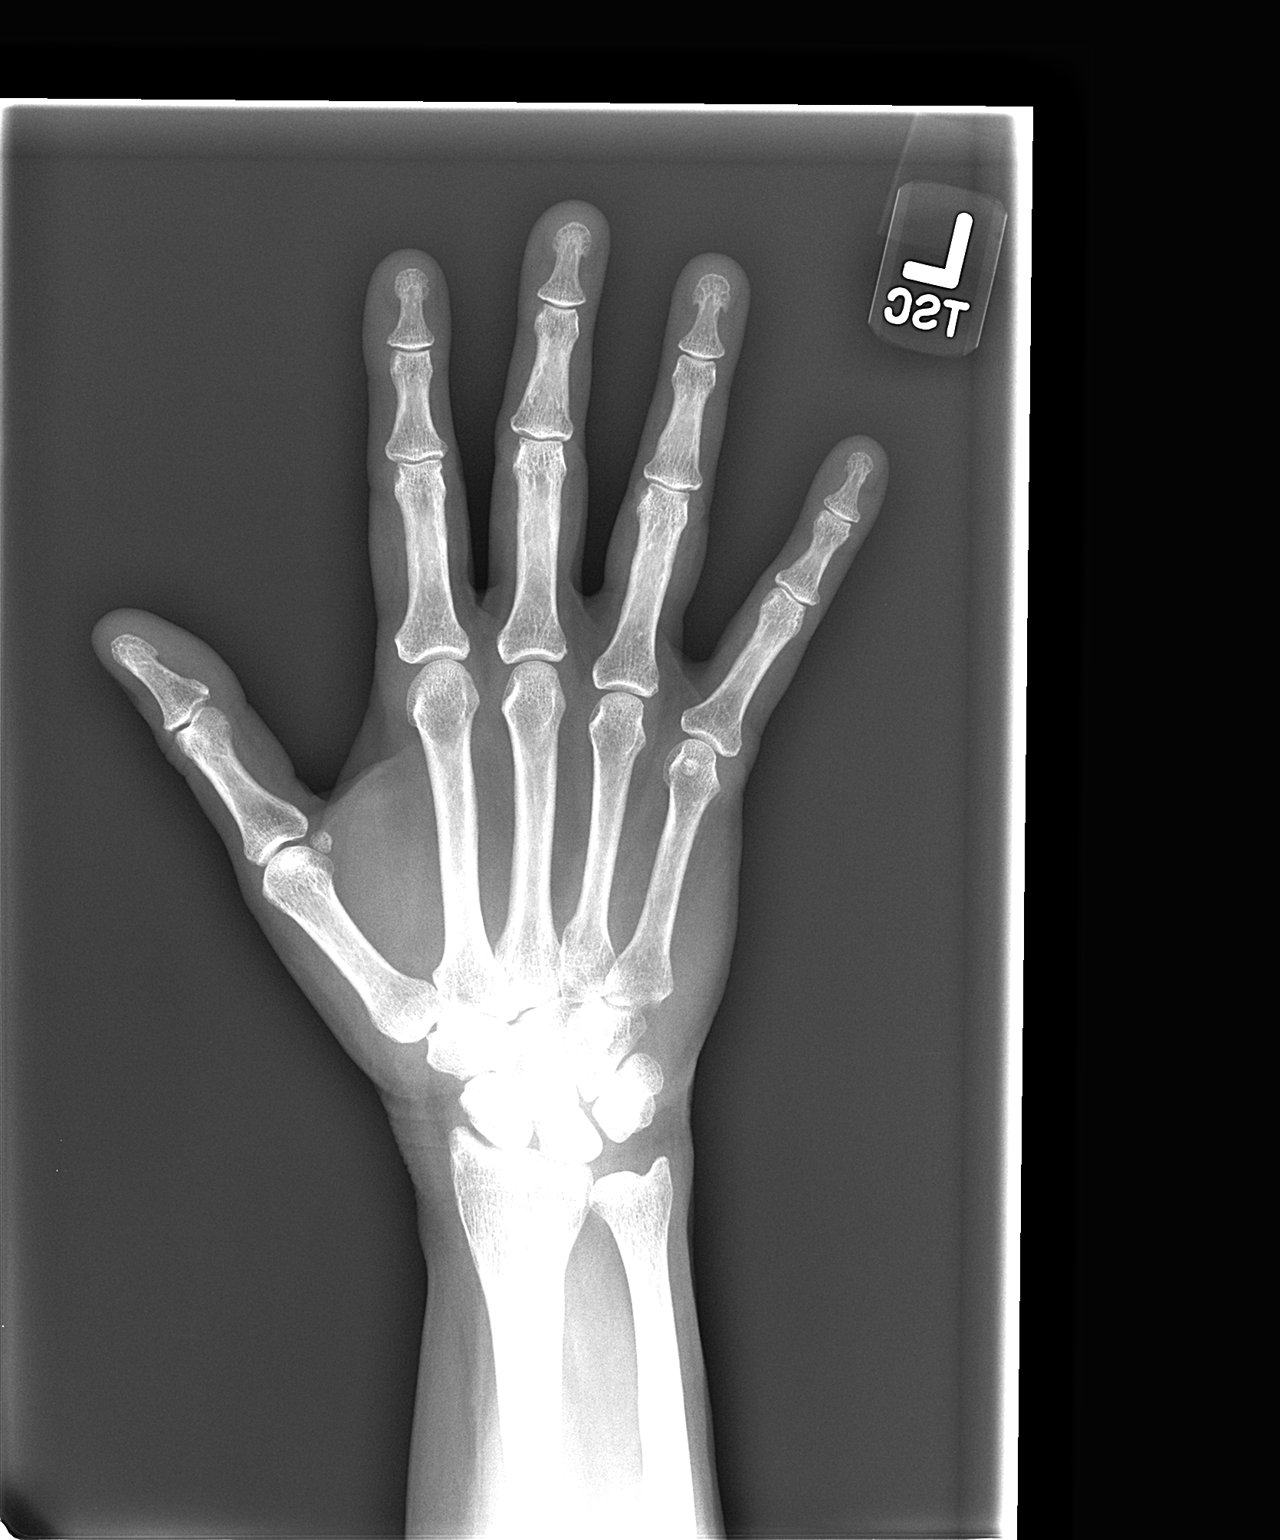

[view not recorded (2 of 3)]
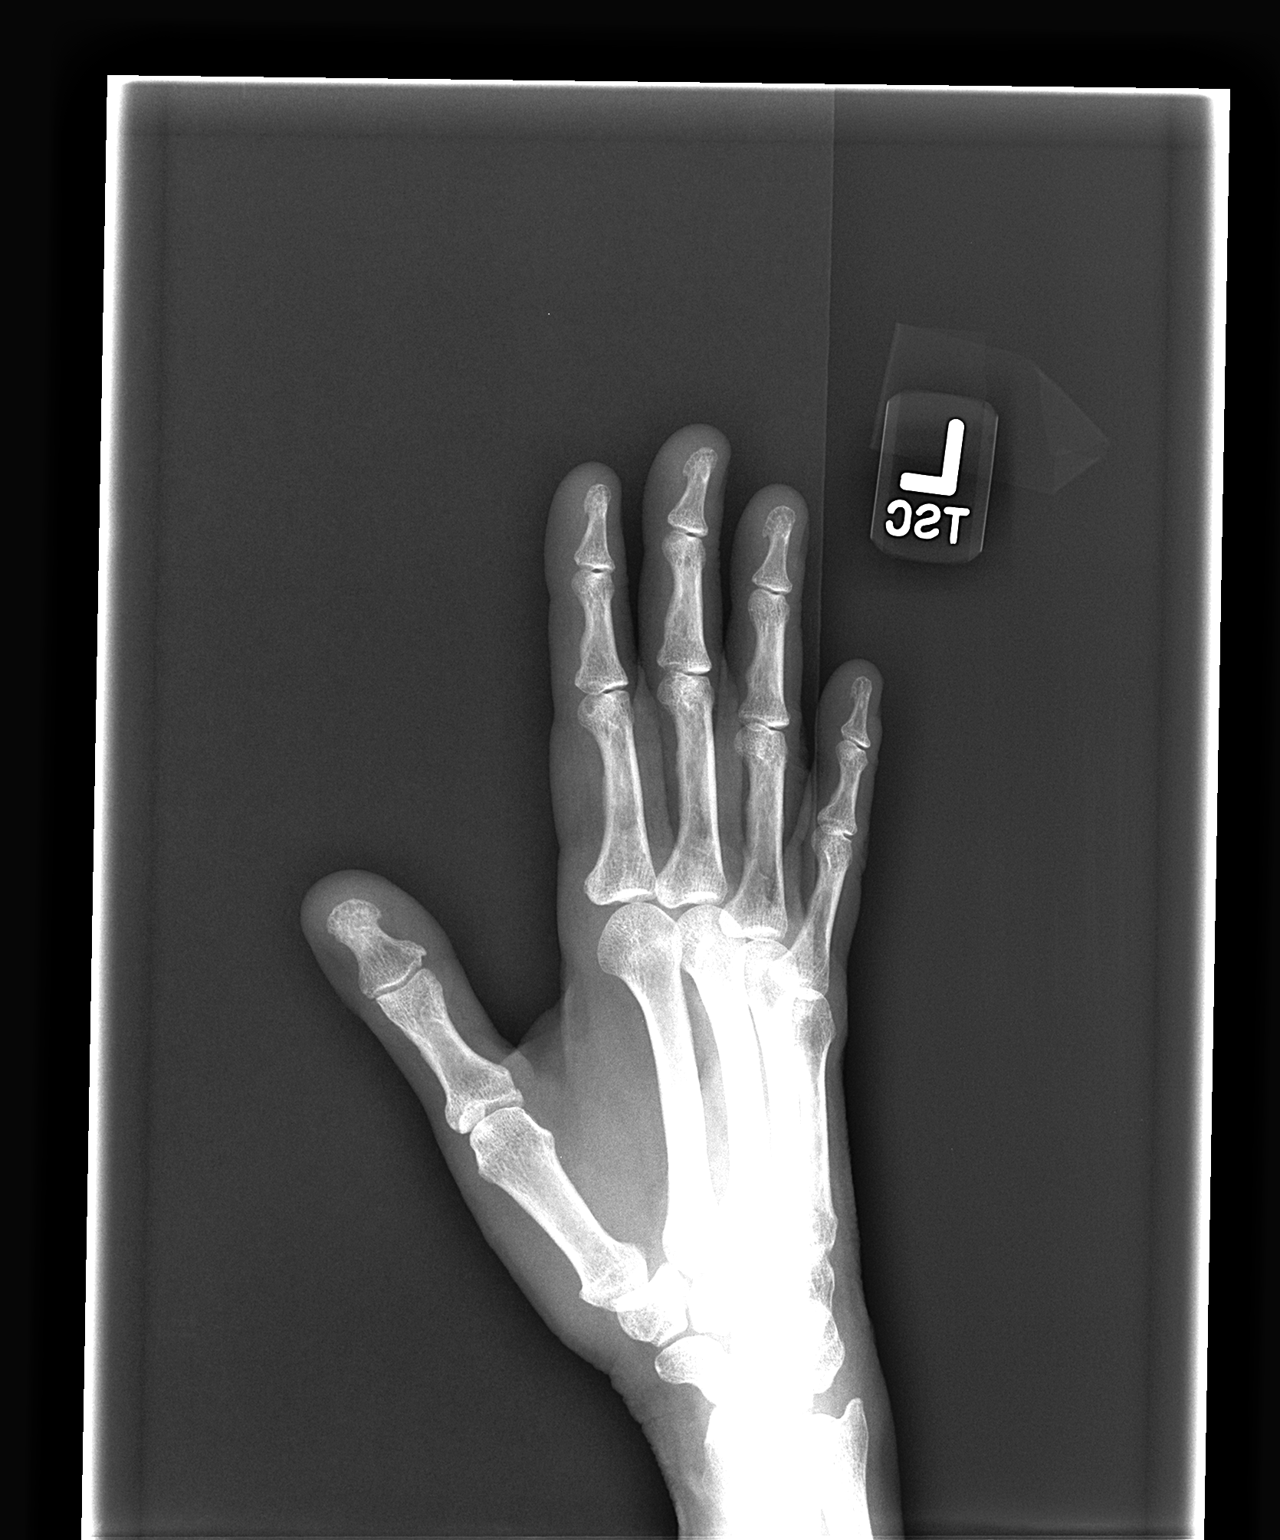

[view not recorded (3 of 3)]
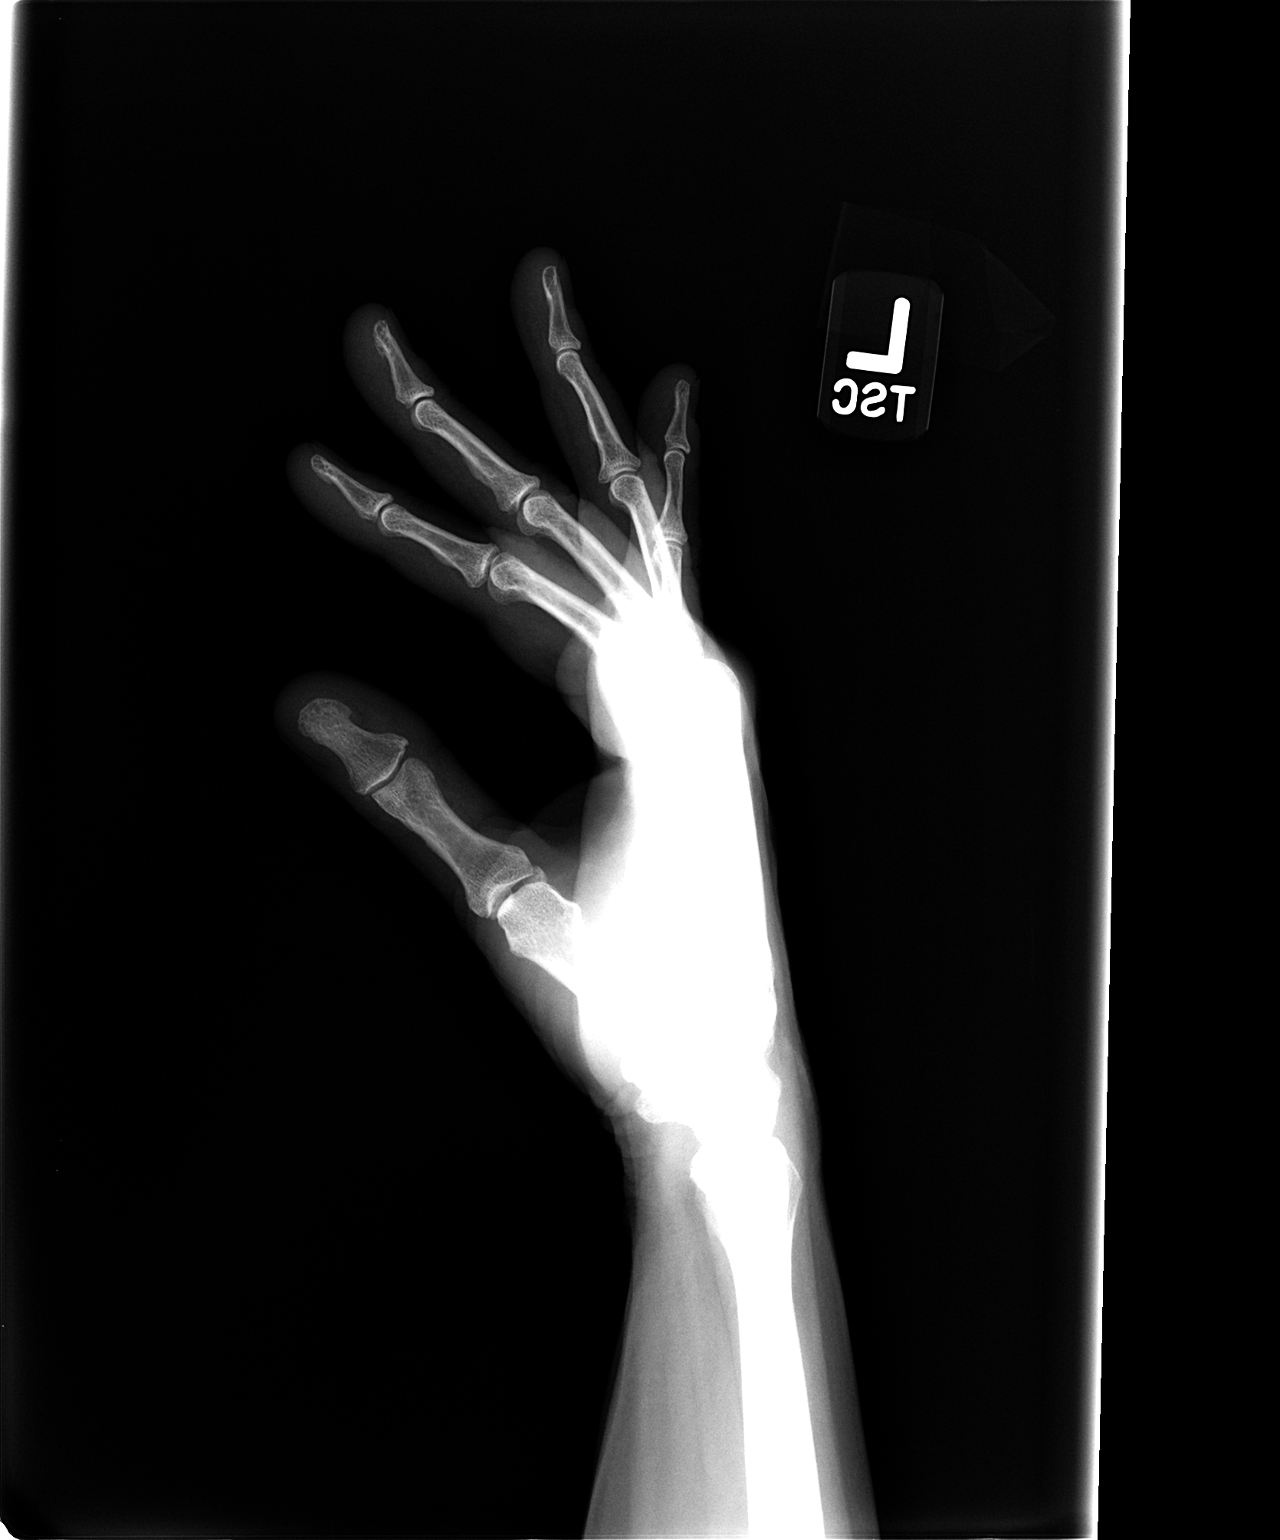

[3 of 3 positions shown; findings below may reference images not displayed]

FINDINGS: There is no evidence of fracture or dislocation. There is no
evidence of arthropathy or other focal bone abnormality. Soft
tissues are unremarkable.
IMPRESSION: Normal exam.

## 2014-08-25 MED ORDER — ALPRAZOLAM 0.25 MG PO TABS: ORAL_TABLET | ORAL | Status: DC

## 2014-08-25 NOTE — Assessment & Plan Note (Signed)
New- very tender on exam in only one area mid hand. Xray today for further evaluation. ?nerve pain Etiology remains unclear The patient indicates understanding of these issues and agrees with the plan.

## 2014-08-25 NOTE — Progress Notes (Signed)
Pre visit review using our clinic review tool, if applicable. No additional management support is needed unless otherwise documented below in the visit note. 

## 2014-08-25 NOTE — Patient Instructions (Signed)
Great to see you.  I will call you with your xray results. 

## 2014-08-25 NOTE — Assessment & Plan Note (Signed)
New- does not seem to be bothering her currently. May resolve on its own.  Advised against hitting it again with a book. She will keep an eye on it. Call or return to clinic prn if these symptoms worsen or fail to improve as anticipated. The patient indicates understanding of these issues and agrees with the plan.

## 2014-08-25 NOTE — Progress Notes (Signed)
Subjective:   Patient ID: Penny Gardner, female    DOB: 1955-06-19, 59 y.o.   MRN: [id number]  Penny Gardner is a pleasant 59 y.o. year old female who presents to clinic today with Hand Pain and Knee Pain  on 08/25/2014  HPI:  1.  Left hand pain- intermittent for a month.  If she hits her hand the right way, shooting pain between third and fourth digit, mid hand.  No pain with grip and no decreased grip strength. Pain is fleeting- does not take anything for it because it just goes away.  But it is a 10/10 when she presses on that one area. No known injury. No hand swelling or redness.  2.  Left knee mass- noticed it a few weeks ago.  Sister hit it with a book and now it is sometimes painful but smaller. Knee hurts but she is on and off her knees all day. Very active.  Current Outpatient Prescriptions on File Prior to Visit  Medication Sig Dispense Refill  . albuterol (PROVENTIL HFA;VENTOLIN HFA) 108 (90 BASE) MCG/ACT inhaler Inhale 2 puffs into the lungs every 6 (six) hours as needed for wheezing or shortness of breath. 1 Inhaler 0  . Ibuprofen 200 MG CAPS Take 400 mg by mouth every 5 (five) hours as needed.    . montelukast (SINGULAIR) 10 MG tablet TAKE 1 TABLET BY MOUTH AT BEDTIME 30 tablet 0  . omeprazole (PRILOSEC) 20 MG capsule Take 1 capsule (20 mg total) by mouth daily. 30 capsule 3   No current facility-administered medications on file prior to visit.    Allergies  Allergen Reactions  . Cefaclor     REACTION: itching    Past Medical History  Diagnosis Date  . Papanicolaou smear of cervix with atypical squamous cells of undetermined significance (ASC-US)     colposcopy 3/07, ASCUS  . Impacted cerumen   . Benign neoplasm of colon   . Diverticulosis of colon (without mention of hemorrhage)     colonoscopy 8/02, 03/11/07 abnormal  . Stricture and stenosis of esophagus   . Esophageal reflux     EGD positive 10/06  . Hiatal hernia   . Postmenopausal HRT  (hormone replacement therapy)   . Plantar fascial fibromatosis   . Conjunctival hemorrhage   . Routine general medical examination at a health care facility     Past Surgical History  Procedure Laterality Date  . Cesarean section      x 2  . Ethmoidectomy      Family History  Problem Relation Age of Onset  . Cancer Mother     colon  . Diabetes Father   . Cancer Maternal Uncle     colon  . Diabetes Paternal Grandmother     Social History   Social History  . Marital Status: Married    Spouse Name: N/A  . Number of Children: N/A  . Years of Education: N/A   Occupational History  . Not on file.   Social History Main Topics  . Smoking status: Never Smoker   . Smokeless tobacco: Never Used  . Alcohol Use: Yes     Comment: rare  . Drug Use: No  . Sexual Activity: Not on file   Other Topics Concern  . Not on file   Social History Narrative   The PMH, PSH, Social History, Family History, Medications, and allergies have been reviewed in [facility], and have been updated if relevant.   Review of Systems  Constitutional: Negative.   Musculoskeletal: Positive for arthralgias. Negative for joint swelling and gait problem.  Skin: Negative.   Neurological: Negative.   All other systems reviewed and are negative.      Objective:    BP 128/60 mmHg  Pulse 90  Temp(Src) 98.1 F (36.7 C) (Oral)  Wt 182 lb 8 oz (82.781 kg)  SpO2 98%   Physical Exam  Constitutional: She is oriented to person, place, and time. She appears well-developed and well-nourished. No distress.  HENT:  Head: Normocephalic.  Eyes: Conjunctivae are normal.  Neck: Normal range of motion.  Cardiovascular: Normal rate.   Pulmonary/Chest: Effort normal.  Musculoskeletal:       Left hand: She exhibits tenderness and bony tenderness. She exhibits normal range of motion, normal two-point discrimination, normal capillary refill, no deformity, no laceration and no swelling. Normal sensation noted. Normal  strength noted. She exhibits no thumb/finger opposition.  Neurological: She is alert and oriented to person, place, and time. No cranial nerve deficit.  Skin: Skin is warm and dry.     Psychiatric: She has a normal mood and affect. Her behavior is normal. Judgment and thought content normal.  Nursing note and vitals reviewed.         Assessment & Plan:   Left hand pain  Ganglion cyst No Follow-up on file.

## 2014-08-27 ENCOUNTER — Other Ambulatory Visit: Payer: Self-pay | Admitting: Family Medicine

## 2014-08-27 DIAGNOSIS — M79643 Pain in unspecified hand: Secondary | ICD-10-CM

## 2014-09-14 ENCOUNTER — Other Ambulatory Visit: Payer: Self-pay | Admitting: Family Medicine

## 2014-09-15 ENCOUNTER — Other Ambulatory Visit: Payer: Self-pay | Admitting: Family Medicine

## 2014-10-12 ENCOUNTER — Ambulatory Visit (INDEPENDENT_AMBULATORY_CARE_PROVIDER_SITE_OTHER): Payer: BC Managed Care – PPO | Admitting: Family Medicine

## 2014-10-12 ENCOUNTER — Encounter: Payer: Self-pay | Admitting: Family Medicine

## 2014-10-12 VITALS — BP 124/76 | HR 78 | Temp 98.1°F | Wt 180.2 lb

## 2014-10-12 DIAGNOSIS — Z23 Encounter for immunization: Secondary | ICD-10-CM | POA: Diagnosis not present

## 2014-10-12 DIAGNOSIS — J069 Acute upper respiratory infection, unspecified: Secondary | ICD-10-CM | POA: Diagnosis not present

## 2014-10-12 MED ORDER — AZITHROMYCIN 250 MG PO TABS: ORAL_TABLET | ORAL | Status: DC

## 2014-10-12 MED ORDER — PREDNISONE 10 MG PO TABS: ORAL_TABLET | ORAL | Status: DC

## 2014-10-12 NOTE — Progress Notes (Signed)
Pre visit review using our clinic review tool, if applicable. No additional management support is needed unless otherwise documented below in the visit note. 

## 2014-10-12 NOTE — Progress Notes (Signed)
SUBJECTIVE:  Penny Gardner is a 59 y.o. female who complains of coryza, congestion, sneezing and bilateral sinus pain for 14 days. She denies a history of anorexia, chest pain, shortness of breath and sweats and does have a history of asthma. Patient denies smoke cigarettes.  singulair and albuterol have not been helping much with wheezing.   Current Outpatient Prescriptions on File Prior to Visit  Medication Sig Dispense Refill  . albuterol (PROVENTIL HFA;VENTOLIN HFA) 108 (90 BASE) MCG/ACT inhaler Inhale 2 puffs into the lungs every 6 (six) hours as needed for wheezing or shortness of breath. 1 Inhaler 0  . ALPRAZolam (XANAX) 0.25 MG tablet Take 1 tablet 30 minutes prior to your flight, may repeat again 30 minutes to 1 hour if still anxious 20 tablet 0  . Ibuprofen 200 MG CAPS Take 400 mg by mouth every 5 (five) hours as needed.    . montelukast (SINGULAIR) 10 MG tablet TAKE 1 TABLET BY MOUTH AT BEDTIME 30 tablet 0  . omeprazole (PRILOSEC) 20 MG capsule Take 1 capsule (20 mg total) by mouth daily. 30 capsule 3   No current facility-administered medications on file prior to visit.    Allergies  Allergen Reactions  . Cefaclor     REACTION: itching    Past Medical History  Diagnosis Date  . Papanicolaou smear of cervix with atypical squamous cells of undetermined significance (ASC-US)     colposcopy 3/07, ASCUS  . Impacted cerumen   . Benign neoplasm of colon   . Diverticulosis of colon (without mention of hemorrhage)     colonoscopy 8/02, 03/11/07 abnormal  . Stricture and stenosis of esophagus   . Esophageal reflux     EGD positive 10/06  . Hiatal hernia   . Postmenopausal HRT (hormone replacement therapy)   . Plantar fascial fibromatosis   . Conjunctival hemorrhage   . Routine general medical examination at a health care facility     Past Surgical History  Procedure Laterality Date  . Cesarean section      x 2  . Ethmoidectomy      Family History  Problem  Relation Age of Onset  . Cancer Mother     colon  . Diabetes Father   . Cancer Maternal Uncle     colon  . Diabetes Paternal Grandmother     Social History   Social History  . Marital Status: Married    Spouse Name: N/A  . Number of Children: N/A  . Years of Education: N/A   Occupational History  . Not on file.   Social History Main Topics  . Smoking status: Never Smoker   . Smokeless tobacco: Never Used  . Alcohol Use: Yes     Comment: rare  . Drug Use: No  . Sexual Activity: Not on file   Other Topics Concern  . Not on file   Social History Narrative   The PMH, PSH, Social History, Family History, Medications, and allergies have been reviewed in [facility], and have been updated if relevant.  OBJECTIVE: BP 124/76 mmHg  Pulse 78  Temp(Src) 98.1 F (36.7 C) (Oral)  Wt 180 lb 4 oz (81.761 kg)  SpO2 98%  She appears well, vital signs are as noted. Ears normal.  Throat and pharynx normal.  Neck supple. No adenopathy in the neck. Nose is congested. Sinuses non tender. Bilateral scattered exp wheezes  ASSESSMENT:  bronchitis  PLAN: Given duration and progression of symptoms in an asthmatic, will treat for bacterial bronchitis  with zpack, prednisone taper as well.  Symptomatic therapy suggested: push fluids, rest and return office visit prn if symptoms persist or worsen.  Call or return to clinic prn if these symptoms worsen or fail to improve as anticipated.

## 2014-12-15 ENCOUNTER — Encounter: Payer: Self-pay | Admitting: Internal Medicine

## 2015-04-13 ENCOUNTER — Encounter: Payer: Self-pay | Admitting: Internal Medicine

## 2015-04-13 ENCOUNTER — Ambulatory Visit (INDEPENDENT_AMBULATORY_CARE_PROVIDER_SITE_OTHER): Payer: BC Managed Care – PPO | Admitting: Internal Medicine

## 2015-04-13 VITALS — BP 128/88 | HR 72 | Temp 98.2°F | Wt 179.0 lb

## 2015-04-13 DIAGNOSIS — B029 Zoster without complications: Secondary | ICD-10-CM

## 2015-04-13 MED ORDER — VALACYCLOVIR HCL 1 G PO TABS: 1000.0000 mg | ORAL_TABLET | Freq: Two times a day (BID) | ORAL | Status: DC

## 2015-04-13 NOTE — Progress Notes (Signed)
Subjective:    Patient ID: Penny Gardner, female    DOB: 1955-09-10, 60 y.o.   MRN: [id number]  HPI  Pt presents to the clinic today with c/o painful lesions near her left eye. This started 3 days ago. She describes the lesions as burning and painful. She also reports associated vision in the left eye and pressure behind her eye. She has not had a shingles vaccine, and does not report recent contact with anyone with shingles. She has not taken anything OTC. She did have shingles 20 years ago on her abdomen. She does have a h/o retinal detachment and saw an opthalmologist about 3 months ago.   Review of Systems  Past Medical History  Diagnosis Date  . Papanicolaou smear of cervix with atypical squamous cells of undetermined significance (ASC-US)     colposcopy 3/07, ASCUS  . Impacted cerumen   . Benign neoplasm of colon   . Diverticulosis of colon (without mention of hemorrhage)     colonoscopy 8/02, 03/11/07 abnormal  . Stricture and stenosis of esophagus   . Esophageal reflux     EGD positive 10/06  . Hiatal hernia   . Postmenopausal HRT (hormone replacement therapy)   . Plantar fascial fibromatosis   . Conjunctival hemorrhage   . Routine general medical examination at a health care facility     Current Outpatient Prescriptions  Medication Sig Dispense Refill  . Ibuprofen 200 MG CAPS Take 400 mg by mouth every 5 (five) hours as needed.    Marland Kitchen omeprazole (PRILOSEC) 20 MG capsule Take 1 capsule (20 mg total) by mouth daily. 30 capsule 3  . Probiotic Product (PROBIOTIC ADVANCED) CAPS Take 1 capsule by mouth daily.    . valACYclovir (VALTREX) 1000 MG tablet Take 1 tablet (1,000 mg total) by mouth 2 (two) times daily. 20 tablet 0   No current facility-administered medications for this visit.    Allergies  Allergen Reactions  . Cefaclor     REACTION: itching    Family History  Problem Relation Age of Onset  . Cancer Mother     colon  . Diabetes Father   . Cancer  Maternal Uncle     colon  . Diabetes Paternal Grandmother     Social History   Social History  . Marital Status: Married    Spouse Name: N/A  . Number of Children: N/A  . Years of Education: N/A   Occupational History  . Not on file.   Social History Main Topics  . Smoking status: Never Smoker   . Smokeless tobacco: Never Used  . Alcohol Use: Yes     Comment: rare  . Drug Use: No  . Sexual Activity: Not on file   Other Topics Concern  . Not on file   Social History Narrative     Constitutional: Denies fever, headache or abrupt weight changes. Positive for fatigue. HEENT: Positive for left eye pain and pressure. Denies left eye redness, itchiness or burning.  Respiratory: Denies difficulty breathing, or shortness of breath.   Cardiovascular: Denies chest pain, or chest tightness.  Skin: Positive for painful lesions near medial aspect of L eye  No other specific complaints in a complete review of systems (except as listed in HPI above).     Objective:   Physical Exam BP 128/88 mmHg  Pulse 72  Temp(Src) 98.2 F (36.8 C) (Oral)  Wt 179 lb (81.194 kg)  SpO2 98% Wt Readings from Last 3 Encounters:  04/13/15 179 lb (  81.194 kg)  10/12/14 180 lb 4 oz (81.761 kg)  08/25/14 182 lb 8 oz (82.781 kg)    General: Appears her stated age, well developed, well nourished in NAD. Skin: Warm, dry and intact. Grouped painful vesicles with erythematous base near medial aspect of left eye. Smaller patch of lesions continuing into the left eyebrow. ? singular lesion on left temporal region.  HEENT: Head: normal shape and size; Eyes: sclera white, no icterus, conjunctiva pink, and EOMs intact. L eyelid swollen (normal for patient)  BMET    Component Value Date/Time   NA 140 07/30/2013 0855   K 4.3 07/30/2013 0855   CL 106 07/30/2013 0855   CO2 29 07/30/2013 0855   GLUCOSE 100* 07/30/2013 0855   BUN 18 07/30/2013 0855   CREATININE 0.9 07/30/2013 0855   CREATININE 0.60  01/08/2012 1625   CALCIUM 9.1 07/30/2013 0855   GFRNONAA 80 11/12/2006 1008   GFRAA 97 11/12/2006 1008    Lipid Panel     Component Value Date/Time   CHOL 233* 07/30/2013 0855   TRIG 126.0 07/30/2013 0855   HDL 49.00 07/30/2013 0855   CHOLHDL 5 07/30/2013 0855   VLDL 25.2 07/30/2013 0855   LDLCALC 159* 07/30/2013 0855    CBC    Component Value Date/Time   WBC 6.8 07/30/2013 0855   RBC 4.51 07/30/2013 0855   HGB 13.6 07/30/2013 0855   HCT 40.6 07/30/2013 0855   PLT 213.0 07/30/2013 0855   MCV 90.1 07/30/2013 0855   MCH 29.9 01/08/2012 1625   MCHC 33.5 07/30/2013 0855   RDW 12.7 07/30/2013 0855   LYMPHSABS 2.3 07/30/2013 0855   MONOABS 0.4 07/30/2013 0855   EOSABS 0.1 07/30/2013 0855   BASOSABS 0.0 07/30/2013 0855        Assessment & Plan:   Shingles, close to left eye:  Start Valtrex 1 gm  BID x 10 days Urgent referral to opthalmology Cool compress/Ibuprofen PRN  RTC as needed or if symptoms persist or worsen

## 2015-04-13 NOTE — Progress Notes (Signed)
Pre visit review using our clinic review tool, if applicable. No additional management support is needed unless otherwise documented below in the visit note. 

## 2015-04-13 NOTE — Patient Instructions (Signed)

## 2015-04-27 ENCOUNTER — Other Ambulatory Visit: Payer: Self-pay | Admitting: Internal Medicine

## 2015-04-27 DIAGNOSIS — B029 Zoster without complications: Secondary | ICD-10-CM

## 2015-04-27 MED ORDER — VALACYCLOVIR HCL 1 G PO TABS: 1000.0000 mg | ORAL_TABLET | Freq: Two times a day (BID) | ORAL | Status: DC

## 2015-11-21 ENCOUNTER — Encounter: Payer: Self-pay | Admitting: Family Medicine

## 2016-01-09 HISTORY — PX: CATARACT EXTRACTION: SUR2

## 2016-04-02 ENCOUNTER — Other Ambulatory Visit (HOSPITAL_COMMUNITY): Payer: Self-pay | Admitting: Surgery

## 2016-04-04 ENCOUNTER — Other Ambulatory Visit (HOSPITAL_COMMUNITY): Payer: Self-pay | Admitting: Surgery

## 2016-04-04 DIAGNOSIS — K56699 Other intestinal obstruction unspecified as to partial versus complete obstruction: Secondary | ICD-10-CM

## 2016-04-12 ENCOUNTER — Ambulatory Visit (HOSPITAL_COMMUNITY)
Admission: RE | Admit: 2016-04-12 | Discharge: 2016-04-12 | Disposition: A | Discharge status: Home / Self Care | Payer: BC Managed Care – PPO | Source: Ambulatory Visit | Attending: Surgery | Admitting: Surgery

## 2016-04-12 DIAGNOSIS — K5730 Diverticulosis of large intestine without perforation or abscess without bleeding: Secondary | ICD-10-CM | POA: Diagnosis not present

## 2016-04-12 DIAGNOSIS — K56699 Other intestinal obstruction unspecified as to partial versus complete obstruction: Secondary | ICD-10-CM

## 2016-04-12 IMAGING — RF DG BE W/ CM - WO/W KUB
5 of 15 series · 7 of 17 positions shown · non-contrast
Comparison: CT abdomen pelvis [DATE] None. Preliminary KUB
demonstrates normal bowel gas pattern.

CLINICAL DATA: Diverticular stricture. Incomplete colonoscopy last
month

EXAM:
SINGLE COLUMN BARIUM ENEMA
TECHNIQUE: Initial scout AP supine abdominal image obtained to insure adequate
colon cleansing. Barium was introduced into the colon in a
retrograde fashion and refluxed from the rectum to the cecum. Spot
images of the colon followed by overhead radiographs were obtained.
FLUOROSCOPY TIME:  Fluoroscopy Time:  2 minutes and 0 second
Radiation Exposure Index (if provided by the fluoroscopic device):
Number of Acquired Spot Images: 0

[Series 1: t abdomen supine · 0.15mm/px · 1 of 1 slices shown]
[im 1/1]
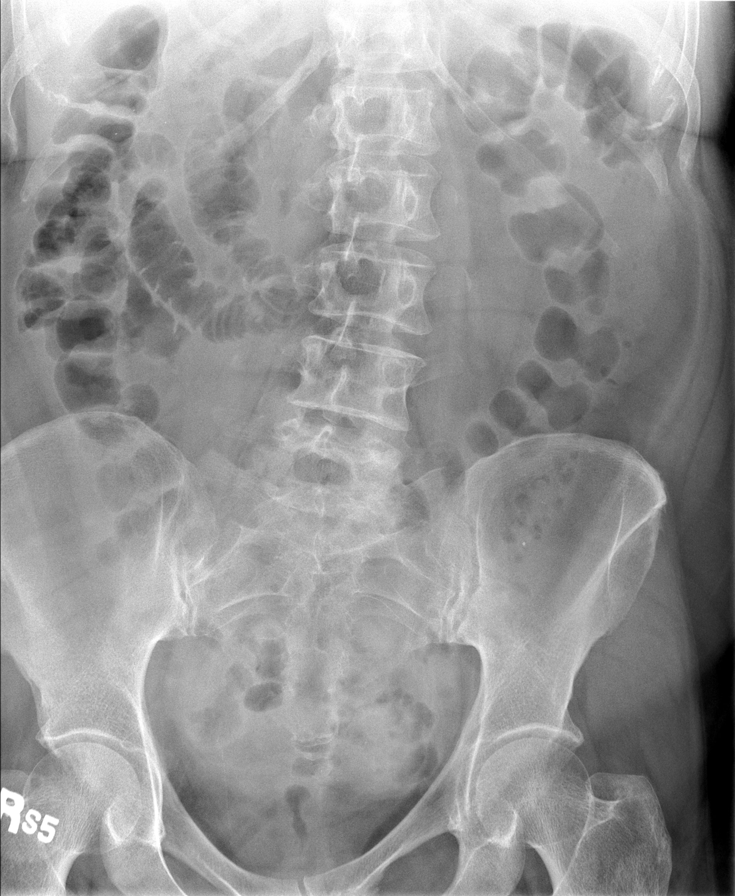

[Series 2: cp_standard · 0.20mm/px · 2 of 2 slices shown (1 of 4)]
[im 1/2]
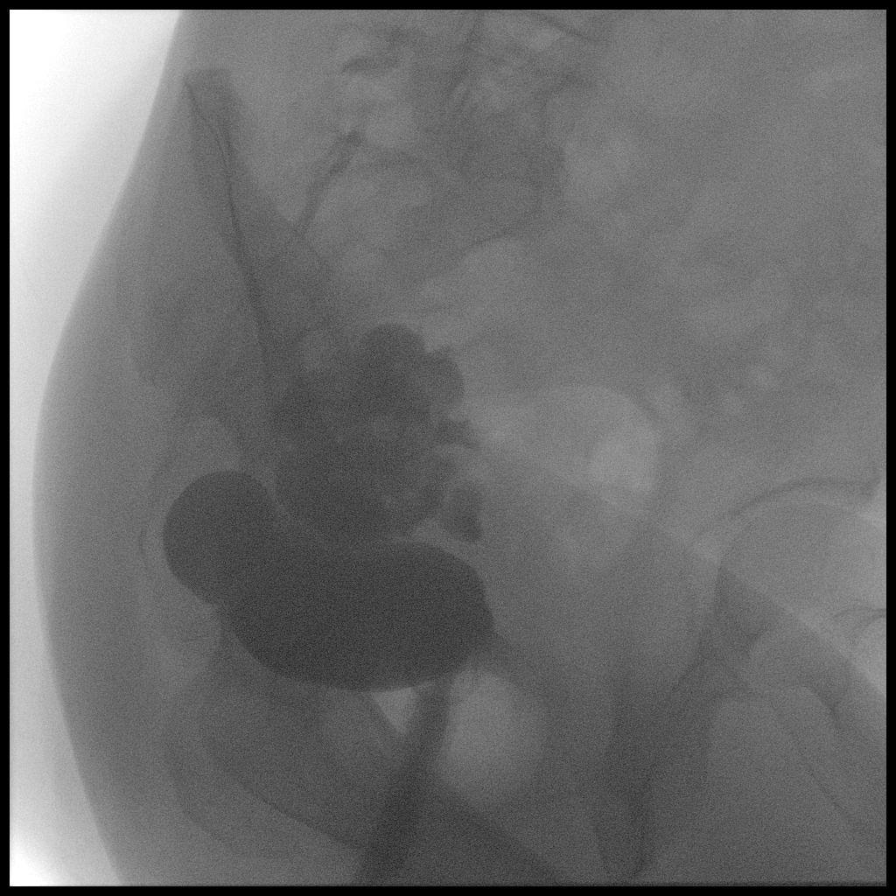
[im 2/2]
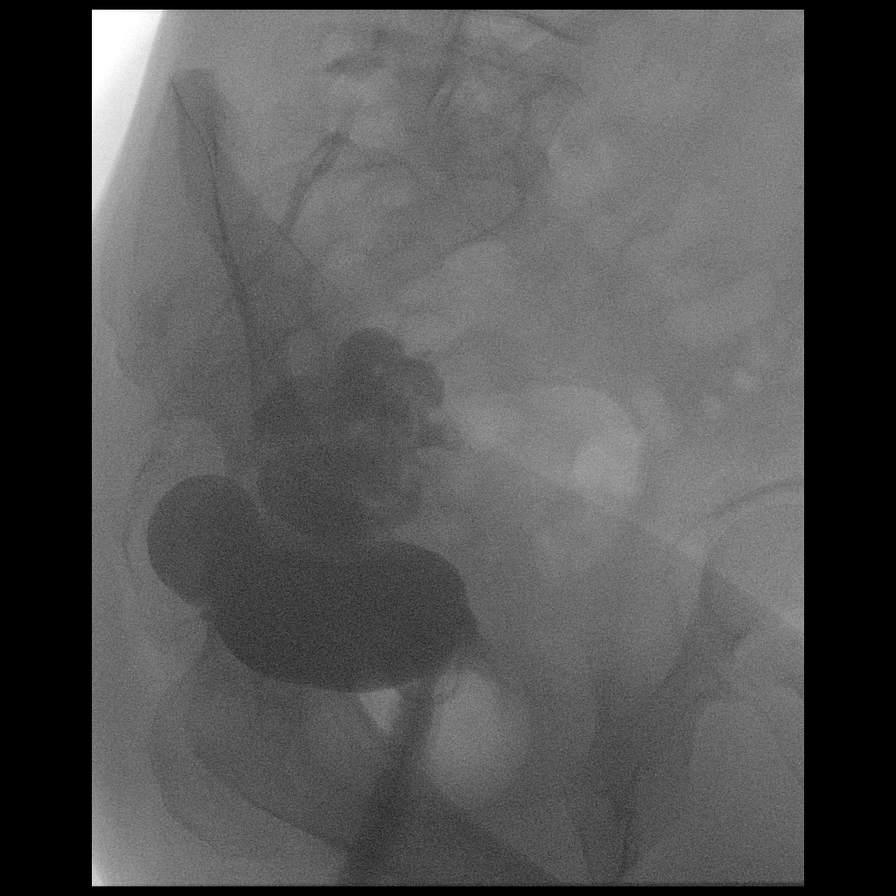

[Series 4: cp_standard · 0.20mm/px · 2 of 2 slices shown (2 of 4)]
[im 1/2]
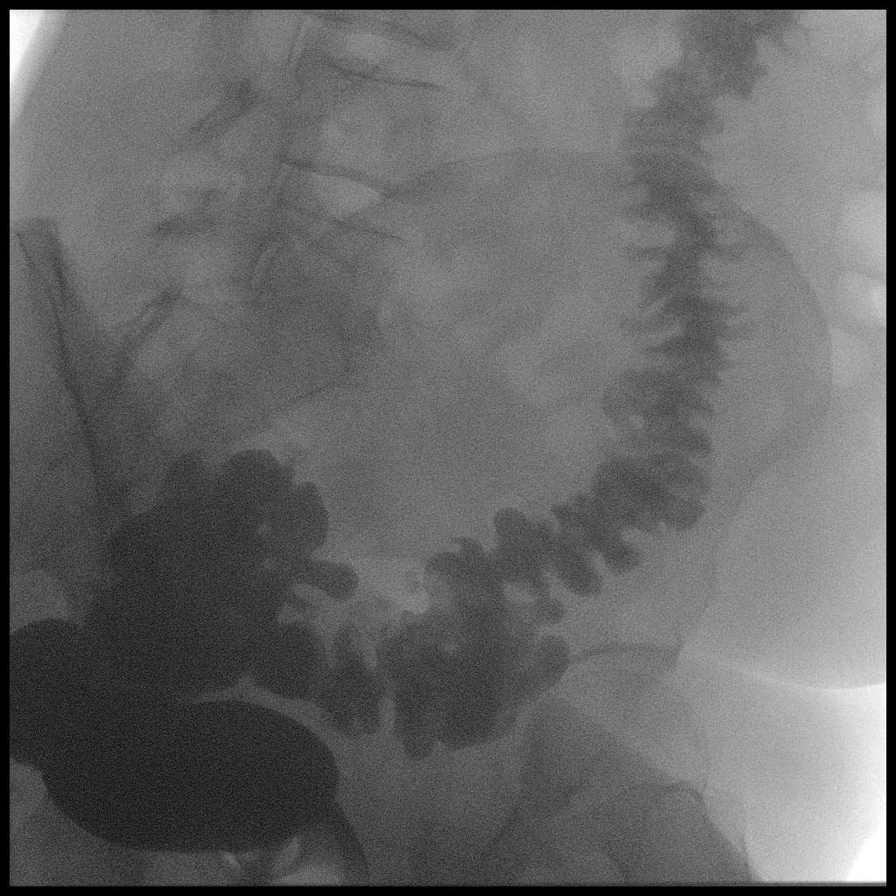
[im 2/2]
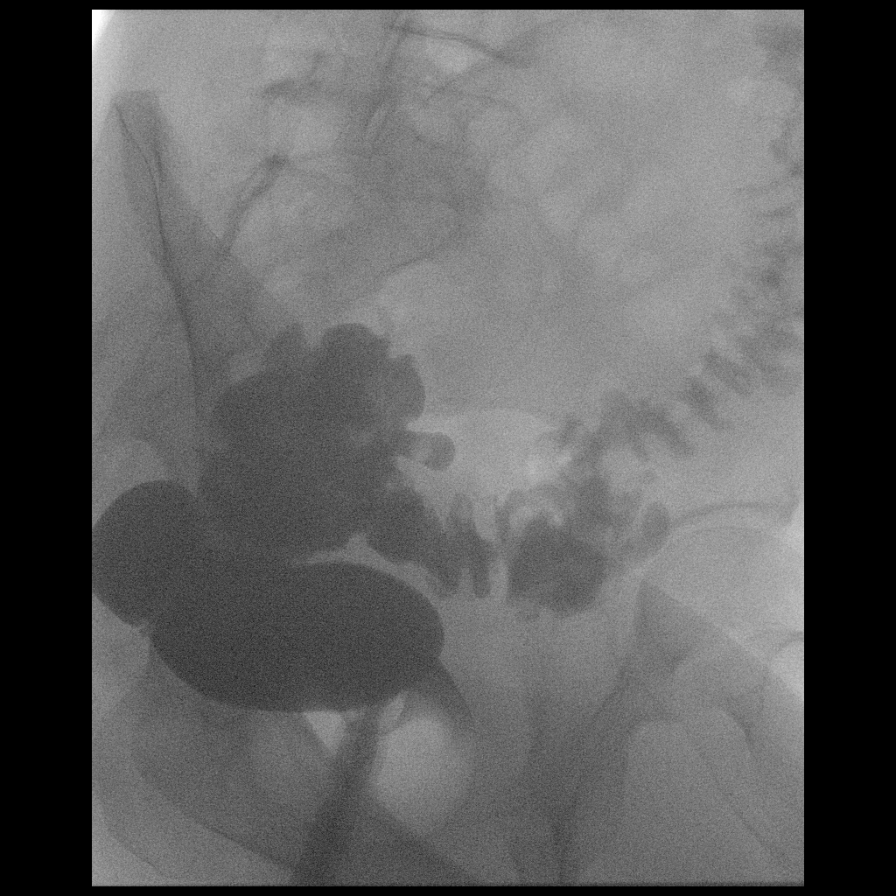

[Series 6: cp_standard · 0.20mm/px · 1 of 1 slices shown (3 of 4)]
[im 1/1]
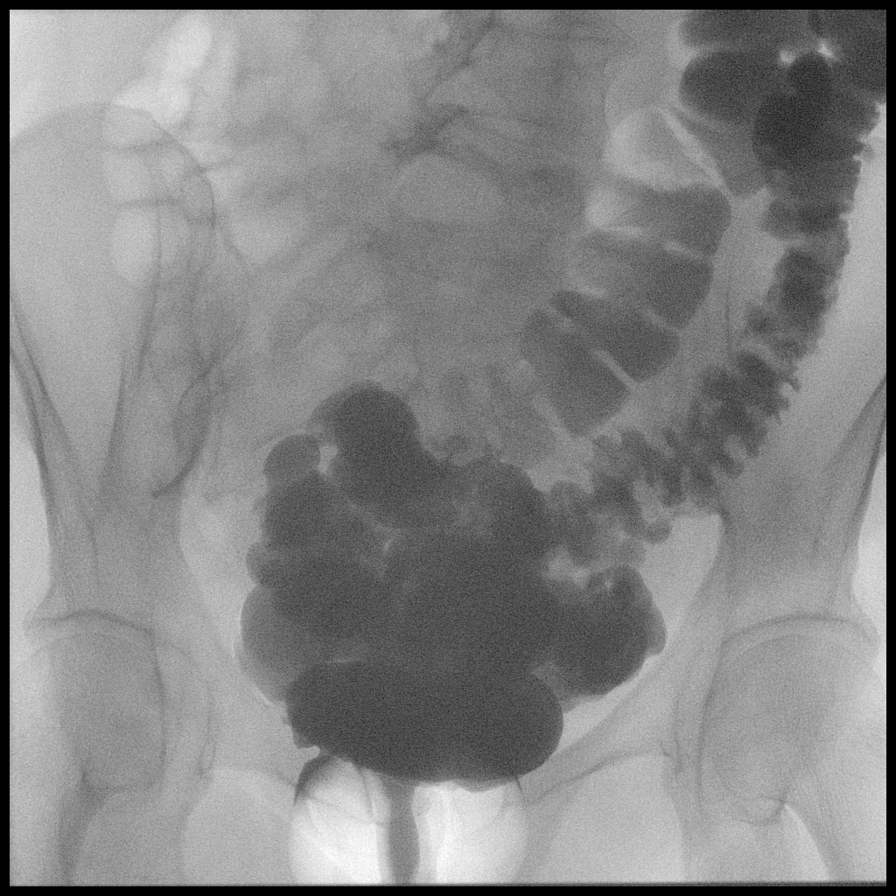

[Series 7: cp_standard · 0.20mm/px · 1 of 1 slices shown (4 of 4)]
[im 1/1]
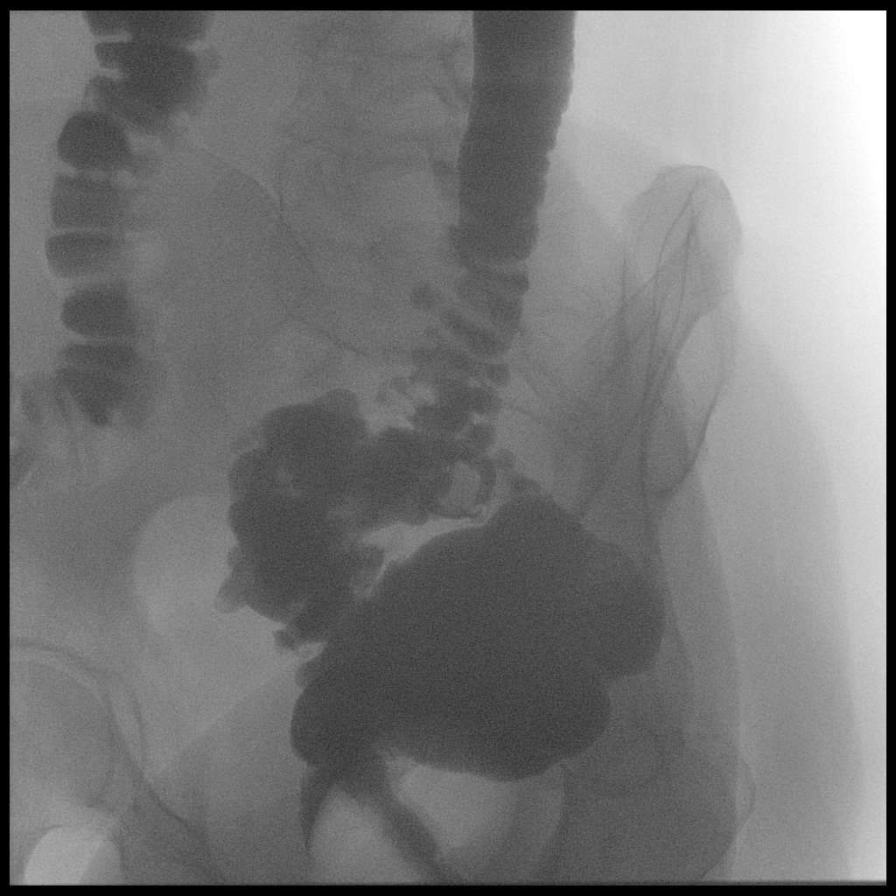

[7 of 17 positions shown; findings below may reference images not displayed]

FINDINGS: Barium was allowed to reflux to the cecum. No obstruction. There is
reflux into the terminal ileum.

Extensive diverticular change is present in the sigmoid colon. There
is redundancy of the sigmoid colon causing overlap of these loops.
No mass lesion is seen. There are areas of narrowing through the
sigmoid colon but no critical stricture is identified.

There are scattered diverticula in the left colon. No significant
diverticular in the right or transverse colon. Negative for mass
lesion.
IMPRESSION: Extensive sigmoid diverticulosis. No stricture or mass lesion. Mild
diverticulosis in the left colon. Negative for obstruction.

## 2016-05-09 ENCOUNTER — Encounter: Payer: Self-pay | Admitting: Family Medicine

## 2016-05-09 ENCOUNTER — Ambulatory Visit (INDEPENDENT_AMBULATORY_CARE_PROVIDER_SITE_OTHER): Payer: BC Managed Care – PPO | Admitting: Family Medicine

## 2016-05-09 VITALS — BP 100/80 | HR 116 | Temp 98.8°F | Wt 171.1 lb

## 2016-05-09 DIAGNOSIS — R509 Fever, unspecified: Secondary | ICD-10-CM | POA: Diagnosis not present

## 2016-05-09 LAB — COMPREHENSIVE METABOLIC PANEL
ALT: 42 U/L — AB (ref 0–35)
AST: 32 U/L (ref 0–37)
Albumin: 4.5 g/dL (ref 3.5–5.2)
Alkaline Phosphatase: 86 U/L (ref 39–117)
BILIRUBIN TOTAL: 0.5 mg/dL (ref 0.2–1.2)
BUN: 13 mg/dL (ref 6–23)
CHLORIDE: 101 meq/L (ref 96–112)
CO2: 26 meq/L (ref 19–32)
Calcium: 9.7 mg/dL (ref 8.4–10.5)
Creatinine, Ser: 0.87 mg/dL (ref 0.40–1.20)
GFR: 70.19 mL/min (ref >60.00)
GLUCOSE: 127 mg/dL — AB (ref 70–99)
Potassium: 4.4 mEq/L (ref 3.5–5.1)
Sodium: 135 mEq/L (ref 135–145)
Total Protein: 8.2 g/dL (ref 6.0–8.3)

## 2016-05-09 LAB — CBC WITH DIFFERENTIAL/PLATELET
BASOS PCT: 0.6 % (ref 0.0–3.0)
Basophils Absolute: 0 10*3/uL (ref 0.0–0.1)
Eosinophils Absolute: 0 10*3/uL (ref 0.0–0.7)
Eosinophils Relative: 0.3 % (ref 0.0–5.0)
HCT: 45.8 % (ref 36.0–46.0)
Hemoglobin: 15.4 g/dL — ABNORMAL HIGH (ref 12.0–15.0)
LYMPHS ABS: 1.5 10*3/uL (ref 0.7–4.0)
Lymphocytes Relative: 34.2 % (ref 12.0–46.0)
MCHC: 33.6 g/dL (ref 30.0–36.0)
MCV: 88.6 fl (ref 78.0–100.0)
MONOS PCT: 9 % (ref 3.0–12.0)
Monocytes Absolute: 0.4 10*3/uL (ref 0.1–1.0)
NEUTROS ABS: 2.4 10*3/uL (ref 1.4–7.7)
NEUTROS PCT: 55.9 % (ref 43.0–77.0)
PLATELETS: 146 10*3/uL — AB (ref 150.0–400.0)
RBC: 5.18 Mil/uL — ABNORMAL HIGH (ref 3.87–5.11)
RDW: 13.2 % (ref 11.5–15.5)
WBC: 4.4 10*3/uL (ref 4.0–10.5)

## 2016-05-09 MED ORDER — DOXYCYCLINE HYCLATE 100 MG PO TABS: 100.0000 mg | ORAL_TABLET | Freq: Two times a day (BID) | ORAL | 0 refills | Status: DC

## 2016-05-09 NOTE — Progress Notes (Signed)
Subjective:   Patient ID: Penny Gardner, female    DOB: 1955-07-22, 61 y.o.   MRN: [id number]  Penny Gardner is a pleasant 61 y.o. year old female who presents to clinic today with Chills (sx started 4 days ago ); Fever; Generalized Body Aches; and Dizziness (started last  night)  on 05/09/2016  HPI:  No URI symptoms.  No known tick bite but [place]  Was in contact with family relative with mono.  No rashes.   She does have a sore throat.  Tmax 102.  She has constant body aches and chills.  Current Outpatient Prescriptions on File Prior to Visit  Medication Sig Dispense Refill  . Ibuprofen 200 MG CAPS Take 400 mg by mouth every 5 (five) hours as needed.    Marland Kitchen omeprazole (PRILOSEC) 20 MG capsule Take 1 capsule (20 mg total) by mouth daily. 30 capsule 3  . Probiotic Product (PROBIOTIC ADVANCED) CAPS Take 1 capsule by mouth daily.     No current facility-administered medications on file prior to visit.     Allergies  Allergen Reactions  . Cefaclor     REACTION: itching    Past Medical History:  Diagnosis Date  . Benign neoplasm of colon   . Conjunctival hemorrhage   . Diverticulosis of colon (without mention of hemorrhage)    colonoscopy 8/02, 03/11/07 abnormal  . Esophageal reflux    EGD positive 10/06  . Hiatal hernia   . Impacted cerumen   . Papanicolaou smear of cervix with atypical squamous cells of undetermined significance (ASC-US)    colposcopy 3/07, ASCUS  . Plantar fascial fibromatosis   . Postmenopausal HRT (hormone replacement therapy)   . Routine general medical examination at a health care facility   . Stricture and stenosis of esophagus     Past Surgical History:  Procedure Laterality Date  . CESAREAN SECTION     x 2  . ETHMOIDECTOMY      Family History  Problem Relation Age of Onset  . Cancer Mother     colon  . Diabetes Father   . Cancer Maternal Uncle     colon  . Diabetes Paternal Grandmother      Social History   Social History  . Marital status: Married    Spouse name: N/A  . Number of children: N/A  . Years of education: N/A   Occupational History  . Not on file.   Social History Main Topics  . Smoking status: Never Smoker  . Smokeless tobacco: Never Used  . Alcohol use Yes     Comment: rare  . Drug use: No  . Sexual activity: Not on file   Other Topics Concern  . Not on file   Social History Narrative  . No narrative on file   The PMH, PSH, Social History, Family History, Medications, and allergies have been reviewed in [facility], and have been updated if relevant.   Review of Systems  Constitutional: Positive for fatigue and fever.  HENT: Negative.   Eyes: Negative.   Respiratory: Negative.   Cardiovascular: Negative.   Gastrointestinal: Negative.   Genitourinary: Negative.   Musculoskeletal: Negative.   Allergic/Immunologic: Negative.   Neurological: Positive for headaches. Negative for tremors, seizures, syncope, speech difficulty, light-headedness and numbness.  Hematological: Negative.   Psychiatric/Behavioral: Negative.   All other systems reviewed and are negative.      Objective:    BP 100/80 (BP Location: Left Arm,  Patient Position: Sitting, Cuff Size: Normal)   Pulse (!) 116   Temp 98.8 F (37.1 C) (Oral)   Wt 171 lb 1.9 oz (77.6 kg)   SpO2 98%   BMI 28.04 kg/m    Physical Exam  Constitutional: She is oriented to person, place, and time. She appears well-developed and well-nourished. No distress.  HENT:  Head: Normocephalic and atraumatic.  Eyes: Conjunctivae are normal.  Cardiovascular: Normal rate.   Pulmonary/Chest: She has wheezes.  Musculoskeletal: Normal range of motion.  Neurological: She is alert and oriented to person, place, and time. No cranial nerve deficit.  Skin: Skin is warm and dry. She is not diaphoretic.  Psychiatric: She has a normal mood and affect. Her behavior is normal. Judgment and thought content normal.    Nursing note and vitals reviewed.         Assessment & Plan:   Fever, unspecified fever cause - Plan: Rocky mtn spotted fvr ab, IgM-blood, Mono (Epstein Barr Virus), Comprehensive metabolic panel, CBC with Differential/Platelet No Follow-up on file.

## 2016-05-09 NOTE — Patient Instructions (Signed)
Great to see you. Please take doxycycline 100 mg twice daily   I will call you with your results.

## 2016-05-09 NOTE — Assessment & Plan Note (Signed)
With myalgias and not many URI symptoms- slight cough with scattered and faint wheezes on exam. Will start on doxcyline and send labs for RMSF titers and mono. CBC as well. The Gardner indicates understanding of these issues and agrees with the plan. Orders Placed This Encounter  Procedures  . Rocky mtn spotted fvr ab, IgM-blood  . Comprehensive metabolic panel  . CBC with Differential/Platelet  . Mono (Penny Gardner Virus)

## 2016-05-11 ENCOUNTER — Telehealth: Payer: Self-pay

## 2016-05-11 NOTE — Telephone Encounter (Signed)
Pt left v/m; on 05/09/16 pt had lab testing done; pt thinks she was being tested for tick fever and mono. Pt request cb today if has tick fever or mono.

## 2016-05-11 NOTE — Telephone Encounter (Signed)
Yes we did test for both but those have not resulted yet.  Her other labs, CBC, liver and kidney function look good.

## 2016-05-11 NOTE — Telephone Encounter (Signed)
Spoke to pt. Advised her I will let her know as soon as the other tests come back

## 2016-05-15 ENCOUNTER — Other Ambulatory Visit: Payer: Self-pay | Admitting: Family Medicine

## 2016-05-15 ENCOUNTER — Other Ambulatory Visit (INDEPENDENT_AMBULATORY_CARE_PROVIDER_SITE_OTHER): Payer: BC Managed Care – PPO

## 2016-05-15 DIAGNOSIS — R509 Fever, unspecified: Secondary | ICD-10-CM

## 2016-05-15 LAB — MONONUCLEOSIS SCREEN: MONO SCREEN: NEGATIVE

## 2016-05-15 MED ORDER — DOXYCYCLINE HYCLATE 100 MG PO TABS: 100.0000 mg | ORAL_TABLET | Freq: Two times a day (BID) | ORAL | 0 refills | Status: DC

## 2016-05-15 NOTE — Addendum Note (Signed)
Addended by: Marchia Bond on: 05/15/2016 10:18 AM   Modules accepted: Orders

## 2016-05-16 LAB — LYME AB/WESTERN BLOT REFLEX: B burgdorferi Ab IgG+IgM: <0.9 {index}

## 2016-05-17 LAB — ROCKY MTN SPOTTED FVR ABS PNL(IGG+IGM)
RMSF IgG: NOT DETECTED
RMSF IgM: NOT DETECTED

## 2016-05-18 ENCOUNTER — Other Ambulatory Visit: Payer: Self-pay | Admitting: Family Medicine

## 2016-05-18 ENCOUNTER — Encounter: Payer: Self-pay | Admitting: Family Medicine

## 2016-05-18 MED ORDER — DOXYCYCLINE HYCLATE 100 MG PO TABS: 100.0000 mg | ORAL_TABLET | Freq: Two times a day (BID) | ORAL | 0 refills | Status: DC

## 2016-05-21 ENCOUNTER — Ambulatory Visit (INDEPENDENT_AMBULATORY_CARE_PROVIDER_SITE_OTHER): Payer: BC Managed Care – PPO | Admitting: Family Medicine

## 2016-05-21 DIAGNOSIS — S30861A Insect bite (nonvenomous) of abdominal wall, initial encounter: Secondary | ICD-10-CM | POA: Diagnosis not present

## 2016-05-21 DIAGNOSIS — W57XXXA Bitten or stung by nonvenomous insect and other nonvenomous arthropods, initial encounter: Secondary | ICD-10-CM | POA: Diagnosis not present

## 2016-05-21 NOTE — Progress Notes (Signed)
Subjective:   Patient ID: Penny Gardner, female    DOB: 02/15/1955, 61 y.o.   MRN: [id number]  Penny Gardner is a pleasant 61 y.o. year old female who presents to clinic today with [place]  on 05/21/2016  HPI:  Tick bite- found a tick, right inguinal area yesterday. Her husband removed it with tweezers. Did not seem like the tick was very engorged but she is not sure.  Currently on doxycycline.  She is here because the area looked a little swollen and she is flying out to [place].  It is a little itchy and tender.  Denies any systemic symptoms.   Current Outpatient Prescriptions on File Prior to Visit  Medication Sig Dispense Refill  . doxycycline (VIBRA-TABS) 100 MG tablet Take 1 tablet (100 mg total) by mouth 2 (two) times daily. 20 tablet 0  . Ibuprofen 200 MG CAPS Take 400 mg by mouth every 5 (five) hours as needed.    Marland Kitchen omeprazole (PRILOSEC) 20 MG capsule Take 1 capsule (20 mg total) by mouth daily. 30 capsule 3  . Probiotic Product (PROBIOTIC ADVANCED) CAPS Take 1 capsule by mouth daily.     No current facility-administered medications on file prior to visit.     Allergies  Allergen Reactions  . Cefaclor     REACTION: itching    Past Medical History:  Diagnosis Date  . Benign neoplasm of colon   . Conjunctival hemorrhage   . Diverticulosis of colon (without mention of hemorrhage)    colonoscopy 8/02, 03/11/07 abnormal  . Esophageal reflux    EGD positive 10/06  . Hiatal hernia   . Impacted cerumen   . Papanicolaou smear of cervix with atypical squamous cells of undetermined significance (ASC-US)    colposcopy 3/07, ASCUS  . Plantar fascial fibromatosis   . Postmenopausal HRT (hormone replacement therapy)   . Routine general medical examination at a health care facility   . Stricture and stenosis of esophagus     Past Surgical History:  Procedure Laterality Date  . CESAREAN SECTION     x 2  . ETHMOIDECTOMY      Family History    Problem Relation Age of Onset  . Cancer Mother        colon  . Diabetes Father   . Cancer Maternal Uncle        colon  . Diabetes Paternal Grandmother     Social History   Social History  . Marital status: Married    Spouse name: N/A  . Number of children: N/A  . Years of education: N/A   Occupational History  . Not on file.   Social History Main Topics  . Smoking status: Never Smoker  . Smokeless tobacco: Never Used  . Alcohol use Yes     Comment: rare  . Drug use: No  . Sexual activity: Not on file   Other Topics Concern  . Not on file   Social History Narrative  . No narrative on file   The PMH, PSH, Social History, Family History, Medications, and allergies have been reviewed in [facility], and have been updated if relevant.   Review of Systems  Constitutional: Negative.   Eyes: Negative.   Respiratory: Negative.   Cardiovascular: Negative.   Gastrointestinal: Negative.   Musculoskeletal: Negative.   Skin: Negative for rash.  Neurological: Negative.   Psychiatric/Behavioral: Negative.   All other systems reviewed and are negative.      Objective:  BP 130/90   Pulse 85   Temp 98 F (36.7 C)   Wt 177 lb (80.3 kg)   SpO2 98%   BMI 29.01 kg/m    Physical Exam  Constitutional: She is oriented to person, place, and time. She appears well-developed and well-nourished. No distress.  HENT:  Head: Normocephalic and atraumatic.  Eyes: Conjunctivae are normal.  Cardiovascular: Normal rate.   Pulmonary/Chest: Effort normal.  Musculoskeletal: Normal range of motion.  Neurological: She is alert and oriented to person, place, and time. No cranial nerve deficit.  Skin: She is not diaphoretic.     Psychiatric: She has a normal mood and affect. Her behavior is normal. Judgment and thought content normal.  Nursing note and vitals reviewed.         Assessment & Plan:   Tick bite, initial encounter No Follow-up on file.

## 2016-05-21 NOTE — Assessment & Plan Note (Signed)
New- reassurance provided.  Does not appear infected and she is already on doxycyline. No systemic symptoms. Call or return to clinic prn if these symptoms worsen or fail to improve as anticipated. The patient indicates understanding of these issues and agrees with the plan.

## 2017-03-12 ENCOUNTER — Ambulatory Visit: Payer: Self-pay | Admitting: *Deleted

## 2017-03-12 NOTE — Telephone Encounter (Signed)
Pt called because for the last couple of days she has been feeling like menstrual cramps; she did a test that showed she had bacterial vaginitis; she states that her pad had a tinged color and she has had hemorrhoids; she also says when she "walks she can feel it"; the pt also says that she knows her uterus has dropped; she states that she took tylenol and it helped relive the pain; nurse triage initiated; recommendations made per protocol to include seeing a physician within 3 days; pt offered and accepted appointment with Allie Bossier at [facility] 03/13/17 at 1045; pt verbalizes understanding; spoke with Mearl Latin regarding this appointment  Reason for Disposition . [1] Vaginal itching AND     [2] not improved > 3 days following CARE ADVICE  Answer Assessment - Initial Assessment Questions 1. SYMPTOM: "What's the main symptom you're concerned about?" (e.g., pain, itching, dryness)     cramps 2. LOCATION: "Where is the  _______ located?" (e.g., inside/outside, left/right)  [place] 3. ONSET: "When did the  ________  start?"     Saturday 03/09/17 4. PAIN: "Is there any pain?" If so, ask: "How bad is it?" (Scale: 1-10; mild, moderate, severe)     Rated 3-4 out of 10 5. ITCHING: "Is there any itching?" If so, ask: "How bad is it?" (Scale: 1-10; mild, moderate, severe)     Yes mild 6. CAUSE: "What do you think is causing the symptoms?"     Test says she has bacteria in her vaginae 7. OTHER SYMPTOMS: "Do you have any other symptoms?" (e.g., vaginal bleeding, pain with urination)     Not sure if bleeding from hemorrhoids or vagina 8. PREGNANCY: "Is there any chance you are pregnant?" "When was your last menstrual period?"     no  7. CAUSE: "What do you think is causing the discharge?" "Have you had the same problem before? What happened then?" Bacteria in vagina per OTC test 8. OTHER SYMPTOMS: "Do you have any other symptoms?" (e.g., fever, itching, vaginal bleeding, pain with urination, injury to  genital area, vaginal foreign body) no  Protocols used: VAGINAL [facility]

## 2017-03-12 NOTE — Telephone Encounter (Signed)
Spoke with Rena regarding appointment time/length; she spoke with Allie Bossier and requested that the appointment be changed to 1400 on 03/13/17 (30 minute slot); contacted pt and she agrees ttime change.

## 2017-03-13 ENCOUNTER — Ambulatory Visit: Payer: BC Managed Care – PPO | Admitting: Primary Care

## 2017-03-13 ENCOUNTER — Other Ambulatory Visit (HOSPITAL_COMMUNITY)
Admission: RE | Admit: 2017-03-13 | Discharge: 2017-03-13 | Disposition: A | Discharge status: Home / Self Care | Payer: BC Managed Care – PPO | Source: Ambulatory Visit | Attending: Primary Care | Admitting: Primary Care

## 2017-03-13 ENCOUNTER — Encounter: Payer: Self-pay | Admitting: Primary Care

## 2017-03-13 VITALS — BP 140/88 | HR 106 | Temp 98.2°F | Wt 175.0 lb

## 2017-03-13 DIAGNOSIS — K649 Unspecified hemorrhoids: Secondary | ICD-10-CM

## 2017-03-13 DIAGNOSIS — N898 Other specified noninflammatory disorders of vagina: Secondary | ICD-10-CM

## 2017-03-13 LAB — POC URINALSYSI DIPSTICK (AUTOMATED)
Bilirubin, UA: NEGATIVE
GLUCOSE UA: NEGATIVE
Ketones, UA: NEGATIVE
LEUKOCYTES UA: NEGATIVE
NITRITE UA: NEGATIVE
PROTEIN UA: NEGATIVE
SPEC GRAV UA: 1.025 (ref 1.010–1.025)
UROBILINOGEN UA: 0.2 U/dL
pH, UA: 6 (ref 5.0–8.0)

## 2017-03-13 LAB — HEMOCCULT GUIAC POC 1CARD (OFFICE): Fecal Occult Blood, POC: NEGATIVE

## 2017-03-13 NOTE — Patient Instructions (Addendum)
I'll be in touch regarding your results.  It was a pleasure meeting you!

## 2017-03-13 NOTE — Progress Notes (Signed)
Subjective:    Patient ID: Penny Gardner, female    DOB: June 09, 1955, 62 y.o.   MRN: [id number]  HPI  Penny Gardner is a 62 year old female with a history of diverticulitis, hemorrhoids, who presents today with a chief complaint of vaginal irritation.  She also reports hemorrhoids, rectal discomfort, suprapubic pain/pressure. She has a history of hemorrhoid banding in the past as she follows with GI. She got a Publishing rights manager OTC which tested "positive". Her symptoms began 4 days ago.   She denies dysuria, urinary frequency, urinary urgency, bloody stools.     Review of Systems  Constitutional: Negative for fever.  Gastrointestinal: Positive for nausea. Negative for abdominal pain.  Genitourinary: Negative for difficulty urinating, dysuria, flank pain, frequency and vaginal discharge.       Vaginal irritation. Pelvic pressure.       Past Medical History:  Diagnosis Date  . Benign neoplasm of colon   . Conjunctival hemorrhage   . Diverticulosis of colon (without mention of hemorrhage)    colonoscopy 8/02, 03/11/07 abnormal  . Esophageal reflux    EGD positive 10/06  . Hiatal hernia   . Impacted cerumen   . Papanicolaou smear of cervix with atypical squamous cells of undetermined significance (ASC-US)    colposcopy 3/07, ASCUS  . Plantar fascial fibromatosis   . Postmenopausal HRT (hormone replacement therapy)   . Routine general medical examination at a health care facility   . Stricture and stenosis of esophagus      Social History   Socioeconomic History  . Marital status: Married    Spouse name: Not on file  . Number of children: Not on file  . Years of education: Not on file  . Highest education level: Not on file  Social Needs  . Financial resource strain: Not on file  . Food insecurity - worry: Not on file  . Food insecurity - inability: Not on file  . Transportation needs - medical: Not on file  . Transportation needs - non-medical: Not on file    Occupational History  . Not on file  Tobacco Use  . Smoking status: Never Smoker  . Smokeless tobacco: Never Used  Substance and Sexual Activity  . Alcohol use: Yes    Comment: rare  . Drug use: No  . Sexual activity: Not on file  Other Topics Concern  . Not on file  Social History Narrative  . Not on file    Past Surgical History:  Procedure Laterality Date  . CESAREAN SECTION     x 2  . ETHMOIDECTOMY      Family History  Problem Relation Age of Onset  . Cancer Mother        colon  . Diabetes Father   . Cancer Maternal Uncle        colon  . Diabetes Paternal Grandmother     Allergies  Allergen Reactions  . Cefaclor     REACTION: itching    Current Outpatient Medications on File Prior to Visit  Medication Sig Dispense Refill  . Ibuprofen 200 MG CAPS Take 400 mg by mouth every 5 (five) hours as needed.    Marland Kitchen omeprazole (PRILOSEC) 20 MG capsule Take 1 capsule (20 mg total) by mouth daily. 30 capsule 3  . Probiotic Product (PROBIOTIC ADVANCED) CAPS Take 1 capsule by mouth daily.     No current facility-administered medications on file prior to visit.     BP 140/88 (BP Location: Left Arm, Patient  Position: Sitting, Cuff Size: Normal)   Pulse (!) 106   Temp 98.2 F (36.8 C) (Oral)   Wt 175 lb (79.4 kg)   SpO2 98%   BMI 28.68 kg/m    Objective:   Physical Exam  Constitutional: She appears well-nourished.  Neck: Neck supple.  Cardiovascular: Normal rate.  Pulmonary/Chest: Effort normal.  Abdominal: Soft. Bowel sounds are normal. There is no tenderness.  Genitourinary: Rectal exam shows external hemorrhoid. Rectal exam shows guaiac negative stool. There is no tenderness on the right labia. There is no tenderness on the left labia. Cervix exhibits discharge. Cervix exhibits no motion tenderness. No erythema in the vagina. No vaginal discharge found.  Genitourinary Comments: Scant amount of clear/greenish discharge from cervix. Small erythematous external  hemorrhoid without bleeding to anus.   Skin: Skin is warm and dry.          Assessment & Plan:  Vaginal Irritation:  Also with pelvic pressure, hemorrhoids.  Exam today with mild discharge from cervix, otherwise unremarkable. UA today with 1+ blood, negative nitrites and leuks. Hemoccult stool card negative. Pap smear, candida, gardnerella labs pending. Consider vaginal ultrasound given hematuria, await results first.  Penny Koch, NP

## 2017-03-15 ENCOUNTER — Telehealth: Payer: Self-pay

## 2017-03-15 LAB — CYTOLOGY - PAP
BACTERIAL VAGINITIS: NEGATIVE
Candida vaginitis: NEGATIVE
DIAGNOSIS: NEGATIVE
HPV: NOT DETECTED

## 2017-03-15 NOTE — Telephone Encounter (Signed)
Spoken and notified patient of Kate's comments. Patient verbalized understanding. 

## 2017-03-15 NOTE — Telephone Encounter (Signed)
Penny Gardner with Cytology called pap report;pap, hpv, bacterial candida neg; will fax report now to [phone] and report will be in Epic later today. Gentry Fitz NP given verbal report and Vallarie Mare CMA will look out for fax.

## 2017-03-15 NOTE — Telephone Encounter (Signed)
Please notify patient that her pap smear was negative. She does not have a bacterial or yeast infection. Her irritation could be secondary to vaginal dryness from atrophy. She can try OTC moisturizers (not lubricants) 2-3 times weekly for dryness.

## 2017-04-08 ENCOUNTER — Encounter: Payer: Self-pay | Admitting: Primary Care

## 2017-04-08 ENCOUNTER — Ambulatory Visit: Payer: BC Managed Care – PPO | Admitting: Primary Care

## 2017-04-08 VITALS — BP 136/84 | HR 75 | Temp 98.0°F | Ht 65.0 in | Wt 179.0 lb

## 2017-04-08 DIAGNOSIS — K5730 Diverticulosis of large intestine without perforation or abscess without bleeding: Secondary | ICD-10-CM

## 2017-04-08 DIAGNOSIS — Z1159 Encounter for screening for other viral diseases: Secondary | ICD-10-CM | POA: Diagnosis not present

## 2017-04-08 DIAGNOSIS — E785 Hyperlipidemia, unspecified: Secondary | ICD-10-CM

## 2017-04-08 DIAGNOSIS — Z0001 Encounter for general adult medical examination with abnormal findings: Secondary | ICD-10-CM

## 2017-04-08 DIAGNOSIS — M6740 Ganglion, unspecified site: Secondary | ICD-10-CM

## 2017-04-08 DIAGNOSIS — Z0000 Encounter for general adult medical examination without abnormal findings: Secondary | ICD-10-CM

## 2017-04-08 DIAGNOSIS — Z1239 Encounter for other screening for malignant neoplasm of breast: Secondary | ICD-10-CM

## 2017-04-08 DIAGNOSIS — K219 Gastro-esophageal reflux disease without esophagitis: Secondary | ICD-10-CM | POA: Diagnosis not present

## 2017-04-08 LAB — LIPID PANEL
CHOLESTEROL: 194 mg/dL (ref 0–200)
HDL: 49.7 mg/dL (ref >39.00)
LDL Cholesterol: 118 mg/dL — ABNORMAL HIGH (ref 0–99)
NonHDL: 144.44
TRIGLYCERIDES: 133 mg/dL (ref 0.0–149.0)
Total CHOL/HDL Ratio: 4
VLDL: 26.6 mg/dL (ref 0.0–40.0)

## 2017-04-08 LAB — COMPREHENSIVE METABOLIC PANEL
ALK PHOS: 63 U/L (ref 39–117)
ALT: 24 U/L (ref 0–35)
AST: 17 U/L (ref 0–37)
Albumin: 4 g/dL (ref 3.5–5.2)
BUN: 17 mg/dL (ref 6–23)
CALCIUM: 9.2 mg/dL (ref 8.4–10.5)
CO2: 29 mEq/L (ref 19–32)
Chloride: 103 mEq/L (ref 96–112)
Creatinine, Ser: 0.76 mg/dL (ref 0.40–1.20)
GFR: 81.79 mL/min (ref >60.00)
Glucose, Bld: 119 mg/dL — ABNORMAL HIGH (ref 70–99)
Potassium: 4.3 mEq/L (ref 3.5–5.1)
Sodium: 139 mEq/L (ref 135–145)
TOTAL PROTEIN: 7.7 g/dL (ref 6.0–8.3)
Total Bilirubin: 0.5 mg/dL (ref 0.2–1.2)

## 2017-04-08 NOTE — Patient Instructions (Signed)
Stop by the lab prior to leaving today. I will notify you of your results once received.   Continue exercising. You should be getting 150 minutes of moderate intensity exercise weekly.  Continue to work on a healthy diet with plenty of vegetables, fruit, whole grains, lean protein.  Ensure you are consuming 64 ounces of water daily.  [facility] to schedule your mammogram.   Try clotrimazole cream to the red spots on your skin. Apply twice daily for one week.   It was a pleasure to see you today!   Preventive Care 40-64 Years, Female Preventive care refers to lifestyle choices and visits with your health care provider that can promote health and wellness. What does preventive care include?  A yearly physical exam. This is also called an annual well check.  Dental exams once or twice a year.  Routine eye exams. Ask your health care provider how often you should have your eyes checked.  Personal lifestyle choices, including: ? Daily care of your teeth and gums. ? Regular physical activity. ? Eating a healthy diet. ? Avoiding tobacco and drug use. ? Limiting alcohol use. ? Practicing safe sex. ? Taking low-dose aspirin daily starting at age 48. ? Taking vitamin and mineral supplements as recommended by your health care provider. What happens during an annual well check? The services and screenings done by your health care provider during your annual well check will depend on your age, overall health, lifestyle risk factors, and family history of disease. Counseling Your health care provider may ask you questions about your:  Alcohol use.  Tobacco use.  Drug use.  Emotional well-being.  Home and relationship well-being.  Sexual activity.  Eating habits.  Work and work Statistician.  Method of birth control.  Menstrual cycle.  Pregnancy history.  Screening You may have the following tests or measurements:  Height, weight, and BMI.  Blood  pressure.  Lipid and cholesterol levels. These may be checked every 5 years, or more frequently if you are over 21 years old.  Skin check.  Lung cancer screening. You may have this screening every year starting at age 42 if you have a 30-pack-year history of smoking and currently smoke or have quit within the past 15 years.  Fecal occult blood test (FOBT) of the stool. You may have this test every year starting at age 22.  Flexible sigmoidoscopy or colonoscopy. You may have a sigmoidoscopy every 5 years or a colonoscopy every 10 years starting at age 77.  Hepatitis C blood test.  Hepatitis B blood test.  Sexually transmitted disease (STD) testing.  Diabetes screening. This is done by checking your blood sugar (glucose) after you have not eaten for a while (fasting). You may have this done every 1-3 years.  Mammogram. This may be done every 1-2 years. Talk to your health care provider about when you should start having regular mammograms. This may depend on whether you have a family history of breast cancer.  BRCA-related cancer screening. This may be done if you have a family history of breast, ovarian, tubal, or peritoneal cancers.  Pelvic exam and Pap test. This may be done every 3 years starting at age 56. Starting at age 73, this may be done every 5 years if you have a Pap test in combination with an HPV test.  Bone density scan. This is done to screen for osteoporosis. You may have this scan if you are at high risk for osteoporosis.  Discuss your  test results, treatment options, and if necessary, the need for more tests with your health care provider. Vaccines Your health care provider may recommend certain vaccines, such as:  Influenza vaccine. This is recommended every year.  Tetanus, diphtheria, and acellular pertussis (Tdap, Td) vaccine. You may need a Td booster every 10 years.  Varicella vaccine. You may need this if you have not been vaccinated.  Zoster vaccine. You  may need this after age 41.  Measles, mumps, and rubella (MMR) vaccine. You may need at least one dose of MMR if you were born in 1957 or later. You may also need a second dose.  Pneumococcal 13-valent conjugate (PCV13) vaccine. You may need this if you have certain conditions and were not previously vaccinated.  Pneumococcal polysaccharide (PPSV23) vaccine. You may need one or two doses if you smoke cigarettes or if you have certain conditions.  Meningococcal vaccine. You may need this if you have certain conditions.  Hepatitis A vaccine. You may need this if you have certain conditions or if you travel or work in places where you may be exposed to hepatitis A.  Hepatitis B vaccine. You may need this if you have certain conditions or if you travel or work in places where you may be exposed to hepatitis B.  Haemophilus influenzae type b (Hib) vaccine. You may need this if you have certain conditions.  Talk to your health care provider about which screenings and vaccines you need and how often you need them. This information is not intended to replace advice given to you by your health care provider. Make sure you discuss any questions you have with your health care provider. Document Released: 01/21/2015 Document Revised: 09/14/2015 Document Reviewed: 10/26/2014 Elsevier Interactive Patient Education  [employer].

## 2017-04-08 NOTE — Progress Notes (Signed)
Subjective:    Patient ID: Penny Gardner, female    DOB: [birth year], 62 y.o.   MRN: [id number]  HPI  [name] is a 62 year old female who presents today to transfer care from Dr. Deborra Medina and for complete physical.  Immunizations: -Tetanus: Completed in 2013 -Influenza: Completed this past season   Diet: She endorses a healthy diet Breakfast: English Muffin with peanut butter, fruit Lunch: Deli ham with cheese, lettuce wrap Dinner: Meat, vegetable, starch Snacks: Nuts, cheese  Desserts: Twice weekly Beverages: Little water, coffee. Drinks little throughout the day.   Exercise: She exercises at the [facility] 2-3 times weekly  Eye exam: Completed in Fall 2018 Dental exam: Completes semi-annually  Colonoscopy: Sigmoidoscopy in 2018, colonoscopy in 2012, due next week.  Pap Smear: Completed in 2019 Mammogram: Due Hep C Screen: Due today   Review of Systems  Constitutional: Negative for unexpected weight change.  HENT: Negative for rhinorrhea.   Respiratory: Negative for cough and shortness of breath.   Cardiovascular: Negative for chest pain.  Gastrointestinal: Negative for constipation and diarrhea.  Genitourinary: Negative for difficulty urinating and menstrual problem.  Musculoskeletal: Negative for arthralgias and myalgias.  Skin: Negative for rash.  Allergic/Immunologic: Negative for environmental allergies.  Neurological: Negative for dizziness, numbness and headaches.  Psychiatric/Behavioral: The patient is not nervous/anxious.        Past Medical History:  Diagnosis Date  . Benign neoplasm of colon   . Conjunctival hemorrhage   . Diverticulosis of colon (without mention of hemorrhage)    colonoscopy 8/02, 03/11/07 abnormal  . Esophageal reflux    EGD positive 10/06  . Hiatal hernia   . Impacted cerumen   . Papanicolaou smear of cervix with atypical squamous cells of undetermined significance (ASC-US)    colposcopy 3/07, ASCUS  . Plantar fascial  fibromatosis   . Postmenopausal HRT (hormone replacement therapy)   . Routine general medical examination at a health care facility   . Stricture and stenosis of esophagus      Social History   Socioeconomic History  . Marital status: Married    Spouse name: Not on file  . Number of children: Not on file  . Years of education: Not on file  . Highest education level: Not on file  Occupational History  . Not on file  Social Needs  . Financial resource strain: Not on file  . Food insecurity:    Worry: Not on file    Inability: Not on file  . Transportation needs:    Medical: Not on file    Non-medical: Not on file  Tobacco Use  . Smoking status: Never Smoker  . Smokeless tobacco: Never Used  Substance and Sexual Activity  . Alcohol use: Yes    Comment: rare  . Drug use: No  . Sexual activity: Not on file  Lifestyle  . Physical activity:    Days per week: Not on file    Minutes per session: Not on file  . Stress: Not on file  Relationships  . Social connections:    Talks on phone: Not on file    Gets together: Not on file    Attends religious service: Not on file    Active member of club or organization: Not on file    Attends meetings of clubs or organizations: Not on file    Relationship status: Not on file  . Intimate partner violence:    Fear of current or ex partner: Not on file  Emotionally abused: Not on file    Physically abused: Not on file    Forced sexual activity: Not on file  Other Topics Concern  . Not on file  Social History Narrative  . Not on file    Past Surgical History:  Procedure Laterality Date  . CESAREAN SECTION     x 2  . ETHMOIDECTOMY      Family History  Problem Relation Age of Onset  . Cancer Mother        colon  . Diabetes Father   . Cancer Maternal Uncle        colon  . Diabetes Paternal Grandmother     Allergies  Allergen Reactions  . Cefaclor     REACTION: itching    Current Outpatient Medications on File  Prior to Visit  Medication Sig Dispense Refill  . omeprazole (PRILOSEC) 20 MG capsule Take 1 capsule (20 mg total) by mouth daily. 30 capsule 3  . Probiotic Product (PROBIOTIC ADVANCED) CAPS Take 1 capsule by mouth daily.    . Ibuprofen 200 MG CAPS Take 400 mg by mouth every 5 (five) hours as needed.     No current facility-administered medications on file prior to visit.     BP 136/84   Pulse 75   Temp 98 F (36.7 C) (Oral)   Ht 5\' 5"  (1.651 m)   Wt 179 lb (81.2 kg)   SpO2 98%   BMI 29.79 kg/m    Objective:   Physical Exam  Constitutional: She is oriented to person, place, and time. She appears well-nourished.  HENT:  Right Ear: Tympanic membrane and ear canal normal.  Left Ear: Tympanic membrane and ear canal normal.  Nose: Nose normal.  Mouth/Throat: Oropharynx is clear and moist.  Eyes: Pupils are equal, round, and reactive to light. Conjunctivae and EOM are normal.  Neck: Neck supple. No thyromegaly present.  Cardiovascular: Normal rate and regular rhythm.  No murmur heard. Pulmonary/Chest: Effort normal and breath sounds normal. She has no rales.  Abdominal: Soft. Bowel sounds are normal. There is no tenderness.  Musculoskeletal: Normal range of motion.  Lymphadenopathy:    She has no cervical adenopathy.  Neurological: She is alert and oriented to person, place, and time. She has normal reflexes. No cranial nerve deficit.  Skin: Skin is warm and dry. No rash noted.  1 rounded, soft, superficial mass to left plantar foot. Non tender. History of this for years. 2 circular spots with erythema to left anterior lower leg.  Psychiatric: She has a normal mood and affect.          Assessment & Plan:

## 2017-04-08 NOTE — Assessment & Plan Note (Signed)
Repeat lipids pending today. Continue to work on a healthy diet. Recommended to increase water consumption and regular exercise.

## 2017-04-08 NOTE — Assessment & Plan Note (Signed)
Doing well on omeprazole 20 mg. Will experience symptoms without medication, continue current regimen. History of esophageal stricture.

## 2017-04-08 NOTE — Assessment & Plan Note (Signed)
Immunizations UTD. Mammogram due, pending. Pap smear UTD, negative. Colonoscopy UTD, due next week. Discussed the importance of a healthy diet and regular exercise in order for weight loss, and to reduce the risk of any potential medical problems. Exam unremarkable. Labs pending. Follow up in 1 year.

## 2017-04-08 NOTE — Assessment & Plan Note (Signed)
Experiences flares 2-3 times annually. Due for sigmoidoscopy next week. Following with GI.

## 2017-04-08 NOTE — Assessment & Plan Note (Signed)
Benign cyst to left plantar foot today. Would not recommend intervention at this point. She will continue to monitor.

## 2017-04-09 LAB — HEPATITIS C ANTIBODY
Hepatitis C Ab: NONREACTIVE
SIGNAL TO CUT-OFF: 0.01 (ref <1.00)

## 2017-04-10 ENCOUNTER — Other Ambulatory Visit: Payer: Self-pay | Admitting: Primary Care

## 2017-04-10 DIAGNOSIS — R7309 Other abnormal glucose: Secondary | ICD-10-CM

## 2017-05-02 ENCOUNTER — Ambulatory Visit: Payer: BC Managed Care – PPO

## 2017-07-08 ENCOUNTER — Ambulatory Visit
Admission: RE | Admit: 2017-07-08 | Discharge: 2017-07-08 | Disposition: A | Discharge status: Home / Self Care | Payer: BLUE CROSS/BLUE SHIELD | Source: Ambulatory Visit | Attending: Primary Care | Admitting: Primary Care

## 2017-07-08 DIAGNOSIS — Z1239 Encounter for other screening for malignant neoplasm of breast: Secondary | ICD-10-CM

## 2017-10-29 ENCOUNTER — Telehealth: Payer: Self-pay | Admitting: Primary Care

## 2017-10-29 ENCOUNTER — Ambulatory Visit: Payer: Self-pay

## 2017-10-29 ENCOUNTER — Ambulatory Visit: Payer: BLUE CROSS/BLUE SHIELD | Admitting: Internal Medicine

## 2017-10-29 ENCOUNTER — Encounter: Payer: Self-pay | Admitting: Family Medicine

## 2017-10-29 ENCOUNTER — Ambulatory Visit: Payer: BC Managed Care – PPO | Admitting: Family Medicine

## 2017-10-29 DIAGNOSIS — H018 Other specified inflammations of eyelid: Secondary | ICD-10-CM | POA: Diagnosis not present

## 2017-10-29 MED ORDER — NEOMYCIN-POLYMYXIN-HC 3.5-10000-1 OP SUSP: 4.0000 [drp] | Freq: Four times a day (QID) | OPHTHALMIC | 0 refills | Status: DC

## 2017-10-29 MED ORDER — NEOMYCIN-POLYMYXIN-DEXAMETH 3.5-10000-0.1 OP SUSP: 4.0000 [drp] | Freq: Four times a day (QID) | OPHTHALMIC | 0 refills | Status: DC

## 2017-10-29 NOTE — Telephone Encounter (Signed)
Rx for [facility] sent to [facility] in [place] as instructed by Dr. Diona Browner.

## 2017-10-29 NOTE — Telephone Encounter (Signed)
Pt. called to report onset of left eye irritation on Friday, 10/18.  Stated she initially noticed a small ulcer under the left upper eyelid.  Reported that the eye irritation progressed to redness and swelling of upper and lower lid, and redness just below the eye.  C/o itching and burning of the eye. Denied any drainage from the eye.  Denied fever/ chills.  Stated she had a procedure done, about 4 mos. Ago, on the left eye, to correct a blocked tear duct. Stated she has a film over the eye with increased tearing, as the procedure was not effective.  Denied visual disturbance of left eye.    Called Ophthalmologist's office, Dr. Almira Coaster.  Was advised that he is not available today or tomorrow, and recommended Optometry.    Spoke with pt. And she preferred to see her PCP instead of Optometrist.  Appt. Given for 12:00 PM at PCP office.   Advised to wash hands well, and to avoid touching her left eye.  Also recommended cool compress on left eye this AM.  Pt. Verb. Understanding; agreed w/ plan.         Reason for Disposition . [1] Only 1 eye is red AND [2] present > 48 hours  Answer Assessment - Initial Assessment Questions 1. LOCATION: Location: "What's red, the eyeball or the outer eyelids?" (Note: when callers say the eye is red, they usually mean the sclera is red)       Eyelid upper and lower is red and swollen and eyeball is intermittently red 2. REDNESS OF SCLERA: "Is the redness in one or both eyes?" "When did the redness start?"      Left eye has intermittent redness 3. ONSET: "When did the eye become red?" (e.g., hours, days)      Friday 4. EYELIDS: "Are the eyelids red or swollen?" If so, ask: "How much?"      Yes; both upper and lower lids are swollen and red; itching and burning 5. VISION: "Is there any difficulty seeing clearly?"      Has filmy covering over left eye ; tear duct not draining properly  6. ITCHING: "Does it feel itchy?" If so ask: "How bad is it" (e.g., Scale 1-10; or  mild, moderate, severe)     Yes; itching and burning 7. PAIN: "Is there any pain? If so, ask: "How bad is it?" (e.g., Scale 1-10; or mild, moderate, severe)     Denied pain; mainly irritated  8. CONTACT LENS: "Do you wear contacts?"     Denied ; wears glasses  9. CAUSE: "What do you think is causing the redness?"     unknown 10. OTHER SYMPTOMS: "Do you have any other symptoms?" (e.g., fever, runny nose, cough, vomiting)       Denies any mucus drainage from the eye; denied fever/ chills  Protocols used: EYE - RED WITHOUT PUS-A-AH

## 2017-10-29 NOTE — Progress Notes (Signed)
Subjective:    Patient ID: Penny Gardner, female    DOB: 1955/05/26, 62 y.o.   MRN: [id number]  HPI    62 year old female presents  with new onset redness in left eye Started with foreign body sensation in left eye 5 days ago.Marland Kitchen Under top lid saw white bases ulcer.  No longer with sensation of foreign body.  Progressed to swelling soreness under neath eye as well.  Entire eye feels itchy and burning.  Conjunctiva red ness off and on.  No vision changes, no discharge for eye. no eyeball pain.  No cold symptoms, no congestion, no sinus tenderness.  no fever, no cough.   No facial rash or sensitivity, no blisters. No new medicaitons  Wears glasses, no  Contacts.  no pink eye exposure, no sick contacts.   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  Always has watery eye off and on . Had stent in past, never had surgery to fix. Social History /Family History/Past Medical History reviewed in detail and updated in EMR if needed. Blood pressure 120/78, pulse 88, temperature 98 F (36.7 C), temperature source Oral, height 5\' 5"  (1.651 m), weight 180 lb 12 oz (82 kg).   Review of Systems  Constitutional: Negative for fatigue and fever.  HENT: Negative for ear pain.   Eyes: Positive for redness and itching. Negative for photophobia, pain, discharge and visual disturbance.  Respiratory: Negative for chest tightness and shortness of breath.   Cardiovascular: Negative for chest pain, palpitations and leg swelling.  Gastrointestinal: Negative for abdominal pain.  Genitourinary: Negative for dysuria.       Objective:   Physical Exam  Constitutional: Vital signs are normal. She appears well-developed and well-nourished. She is cooperative.  Non-toxic appearance. She does not appear ill. No distress.  HENT:  Head: Normocephalic.  Right Ear: Hearing, tympanic membrane, external ear and ear canal normal. Tympanic membrane is not erythematous, not retracted and not  bulging.  Left Ear: Hearing, tympanic membrane, external ear and ear canal normal. Tympanic membrane is not erythematous, not retracted and not bulging.  Nose: No mucosal edema or rhinorrhea. Right sinus exhibits no maxillary sinus tenderness and no frontal sinus tenderness. Left sinus exhibits no maxillary sinus tenderness and no frontal sinus tenderness.  Mouth/Throat: Uvula is midline, oropharynx is clear and moist and mucous membranes are normal.  Eyes: Pupils are equal, round, and reactive to light. EOM and lids are normal. Lids are everted and swept, no foreign bodies found. Right eye exhibits no discharge, no exudate and no hordeolum. No foreign body present in the right eye. Left eye exhibits no discharge, no exudate and no hordeolum. No foreign body present in the left eye. Left conjunctiva is injected.  Under left upper lid medically.. Shallow based ulcer seen underneath lid   Fluorescein dye and black light used.. No corneal change noted  Neck: Trachea normal and normal range of motion. Neck supple. Carotid bruit is not present. No thyroid mass and no thyromegaly present.  Cardiovascular: Normal rate, regular rhythm, S1 normal, S2 normal, normal heart sounds, intact distal pulses and normal pulses. Exam reveals no gallop and no friction rub.  No murmur heard. Pulmonary/Chest: Effort normal and breath sounds normal. No tachypnea. No respiratory distress. She has no decreased breath sounds. She has no wheezes. She has no rhonchi. She has no rales.  Abdominal: Soft. Normal appearance and bowel sounds are normal. There is no tenderness.  Neurological: She is alert.  Skin: Skin is warm, dry and intact. No rash noted.  Psychiatric: Her speech is normal and behavior is normal. Judgment and thought content normal. Her mood appears not anxious. Cognition and memory are normal. She does not exhibit a depressed mood.          Assessment & Plan:

## 2017-10-29 NOTE — Patient Instructions (Addendum)
Start warm compress left eye  Three time daily.  Start topical eye drops. Call to make an appt with eye MD within week if not improving.

## 2017-10-29 NOTE — Assessment & Plan Note (Signed)
Unclear cause. No foreign body seen in  Eye, no corneal ulcer. Neg fluroscein dye test.  Pt with symptoms of viral conjunctivitis as well.. ? Viral related ulcer. No viral changes on cornea seen, but diffusely erythematous.   Likely swelling below eye due to tear duct issue and abnormal flow of tears.   Will treat with topical antibiotic/steroid drops to ease  symptoms and prevention of bacterial infection. Also start warm compresess for comfort.   No cellultis/EOMI.

## 2017-10-29 NOTE — Telephone Encounter (Signed)
Okay to change to maxitrol opthlamic.

## 2017-10-29 NOTE — Telephone Encounter (Signed)
Copied from Emma [id number]. Topic: Quick Communication - See Telephone Encounter >> Oct 29, 2017  2:03 PM Bea Graff, NT wrote: CRM for notification. See Telephone encounter for: 10/29/17. Penny [facility] with [facility] calling and states that he neomycin-polymyxin-hydrocortisone (CORTISPORIN) 3.5-10000-1 ophthalmic suspension requires med review and takes 48 hours for them to get the medication in. [facility] would like [facility] see if the med can be changed to maxitrol which they do have in stock. CB#: [phone]

## 2018-01-23 ENCOUNTER — Ambulatory Visit: Payer: BC Managed Care – PPO | Admitting: Family Medicine

## 2018-01-29 ENCOUNTER — Ambulatory Visit: Payer: BC Managed Care – PPO | Admitting: Primary Care

## 2018-01-29 ENCOUNTER — Encounter: Payer: Self-pay | Admitting: Primary Care

## 2018-01-29 ENCOUNTER — Ambulatory Visit
Admission: RE | Admit: 2018-01-29 | Discharge: 2018-01-29 | Disposition: A | Discharge status: Home / Self Care | Payer: BC Managed Care – PPO | Source: Ambulatory Visit | Attending: Primary Care | Admitting: Primary Care

## 2018-01-29 VITALS — BP 122/84 | HR 92 | Temp 98.2°F | Ht 65.0 in | Wt 179.5 lb

## 2018-01-29 DIAGNOSIS — R102 Pelvic and perineal pain: Secondary | ICD-10-CM | POA: Diagnosis not present

## 2018-01-29 LAB — CBC WITH DIFFERENTIAL/PLATELET
Basophils Absolute: 0 10*3/uL (ref 0.0–0.1)
Basophils Relative: 0.6 % (ref 0.0–3.0)
Eosinophils Absolute: 0.2 10*3/uL (ref 0.0–0.7)
Eosinophils Relative: 3.3 % (ref 0.0–5.0)
HEMATOCRIT: 43.2 % (ref 36.0–46.0)
HEMOGLOBIN: 14.4 g/dL (ref 12.0–15.0)
LYMPHS PCT: 29.3 % (ref 12.0–46.0)
Lymphs Abs: 1.8 10*3/uL (ref 0.7–4.0)
MCHC: 33.4 g/dL (ref 30.0–36.0)
MCV: 88.8 fl (ref 78.0–100.0)
MONOS PCT: 6.5 % (ref 3.0–12.0)
Monocytes Absolute: 0.4 10*3/uL (ref 0.1–1.0)
Neutro Abs: 3.7 10*3/uL (ref 1.4–7.7)
Neutrophils Relative %: 60.3 % (ref 43.0–77.0)
Platelets: 268 10*3/uL (ref 150.0–400.0)
RBC: 4.86 Mil/uL (ref 3.87–5.11)
RDW: 12.4 % (ref 11.5–15.5)
WBC: 6.1 10*3/uL (ref 4.0–10.5)

## 2018-01-29 LAB — BASIC METABOLIC PANEL
BUN: 15 mg/dL (ref 6–23)
CALCIUM: 9.5 mg/dL (ref 8.4–10.5)
CO2: 28 mEq/L (ref 19–32)
CREATININE: 0.78 mg/dL (ref 0.40–1.20)
Chloride: 102 mEq/L (ref 96–112)
GFR: 74.49 mL/min (ref >60.00)
Glucose, Bld: 103 mg/dL — ABNORMAL HIGH (ref 70–99)
POTASSIUM: 4.1 meq/L (ref 3.5–5.1)
Sodium: 138 mEq/L (ref 135–145)

## 2018-01-29 NOTE — Assessment & Plan Note (Signed)
Intermittent x 1 year. Exam today without obvious cause for symptoms. Differentials could include c-section surgical scaring, cystocele, rectocele. Labs pending today. [place] of pelvis pending. Referral placed to GYN for further evaluation.

## 2018-01-29 NOTE — Patient Instructions (Addendum)
Stop by the front desk and speak with either Rosaria Ferries or Anastasiya regarding your ultrasound.  Stop by the lab prior to leaving today. I will notify you of your results once received.   It was a pleasure to see you today!

## 2018-01-29 NOTE — Progress Notes (Signed)
Subjective:    Patient ID: Penny Gardner, female    DOB: Apr 20, 1955, 63 y.o.   MRN: [id number]  HPI  Penny Gardner is a 63 year old female with a history of GERD, diverticulosis of colon who presents today with a chief complaint of pelvic pain.   Her pain is located suprapubically which she describes as spasms/discomfort. This is the second time her symptoms began in one year. Pain is present with walking and standing, feels like she has to "hold onto something". Her recent symptoms began on 01/23/18. [facility] that day, UA with 3+ leuks, moderate blood. She was treated with a five day course of Macrobid for which she completed. Urine culture returned and was negative for growth.   She denies vaginal itching, discharge, increased constipation, diarrhea, flank pain. She had a low grade fever two days ago.   Review of Systems  Constitutional: Negative for fever.  Respiratory: Cough:                                Gastrointestinal: Negative for abdominal pain, blood in stool, diarrhea, nausea and vomiting.       Chronic constipation  Genitourinary: Positive for frequency, genital sores and pelvic pain. Negative for dysuria, hematuria and vaginal discharge.       Past Medical History:  Diagnosis Date  . Benign neoplasm of colon   . Conjunctival hemorrhage   . Diverticulosis of colon (without mention of hemorrhage)    colonoscopy 8/02, 03/11/07 abnormal  . Esophageal reflux    EGD positive 10/06  . Hiatal hernia   . Impacted cerumen   . Papanicolaou smear of cervix with atypical squamous cells of undetermined significance (ASC-US)    colposcopy 3/07, ASCUS  . Plantar fascial fibromatosis   . Postmenopausal HRT (hormone replacement therapy)   . Routine general medical examination at a health care facility   . Stricture and stenosis of esophagus      Social History   Socioeconomic History  . Marital status: Married   Spouse name: Not on file  . Number of children: Not on file  . Years of education: Not on file  . Highest education level: Not on file  Occupational History  . Not on file  Social Needs  . Financial resource strain: Not on file  . Food insecurity:    Worry: Not on file    Inability: Not on file  . Transportation needs:    Medical: Not on file    Non-medical: Not on file  Tobacco Use  . Smoking status: Never Smoker  . Smokeless tobacco: Never Used  Substance and Sexual Activity  . Alcohol use: Yes    Comment: rare  . Drug use: No  . Sexual activity: Not on file  Lifestyle  . Physical activity:    Days per week: Not on file    Minutes per session: Not on file  . Stress: Not on file  Relationships  . Social connections:    Talks on phone: Not on file    Gets together: Not on file    Attends religious service: Not on file    Active member of club or organization: Not on file    Attends meetings of clubs or organizations: Not on file    Relationship status: Not on file  . Intimate partner violence:    Fear of current or ex partner: Not  on file    Emotionally abused: Not on file    Physically abused: Not on file    Forced sexual activity: Not on file  Other Topics Concern  . Not on file  Social History Narrative  . Not on file    Past Surgical History:  Procedure Laterality Date  . CESAREAN SECTION     x 2  . ETHMOIDECTOMY      Family History  Problem Relation Age of Onset  . Cancer Mother        colon  . Diabetes Father   . Cancer Maternal Uncle        colon  . Diabetes Paternal Grandmother     Allergies  Allergen Reactions  . Cefaclor     REACTION: itching    Current Outpatient Medications on File Prior to Visit  Medication Sig Dispense Refill  . Ibuprofen 200 MG CAPS Take 400 mg by mouth every 5 (five) hours as needed.    Marland Kitchen omeprazole (PRILOSEC) 20 MG capsule Take 1 capsule (20 mg total) by mouth daily. 30 capsule 3  . Probiotic Product (PROBIOTIC  ADVANCED) CAPS Take 1 capsule by mouth daily.     No current facility-administered medications on file prior to visit.     BP 122/84   Pulse 92   Temp 98.2 F (36.8 C) (Oral)   Ht 5\' 5"  (1.651 m)   Wt 179 lb 8 oz (81.4 kg)   SpO2 98%   BMI 29.87 kg/m    Objective:   Physical Exam  Constitutional: She appears well-nourished.  Cardiovascular: Normal rate.  Respiratory: Effort normal.  GI: Soft. Bowel sounds are normal. There is abdominal tenderness in the suprapubic area.  Genitourinary: There is no tenderness or lesion on the right labia. There is no tenderness or lesion on the left labia. Cervix exhibits no discharge. Right adnexum displays no tenderness. Left adnexum displays no tenderness.    No vaginal discharge or erythema.  No erythema in the vagina.  Skin: Skin is warm and dry.           Assessment & Plan:

## 2018-01-30 LAB — WET PREP BY MOLECULAR PROBE
Candida species: NOT DETECTED
GARDNERELLA VAGINALIS: NOT DETECTED
MICRO NUMBER: 89261
SPECIMEN QUALITY: ADEQUATE
Trichomonas vaginosis: NOT DETECTED

## 2018-02-12 NOTE — Progress Notes (Signed)
New patient 03/05/209 normal pap mammogram  Had 2 episodes of pelvic pain, pt states it feels like period cramps. Pt states it hurts after sex.

## 2018-02-13 ENCOUNTER — Encounter: Payer: Self-pay | Admitting: Obstetrics & Gynecology

## 2018-02-13 ENCOUNTER — Ambulatory Visit: Payer: BC Managed Care – PPO | Admitting: Obstetrics & Gynecology

## 2018-02-13 VITALS — BP 152/86 | HR 72 | Wt 178.0 lb

## 2018-02-13 DIAGNOSIS — N952 Postmenopausal atrophic vaginitis: Secondary | ICD-10-CM | POA: Diagnosis not present

## 2018-02-13 DIAGNOSIS — N941 Unspecified dyspareunia: Secondary | ICD-10-CM | POA: Diagnosis not present

## 2018-02-13 DIAGNOSIS — N949 Unspecified condition associated with female genital organs and menstrual cycle: Secondary | ICD-10-CM | POA: Diagnosis not present

## 2018-02-13 DIAGNOSIS — R102 Pelvic and perineal pain: Secondary | ICD-10-CM | POA: Diagnosis not present

## 2018-02-13 MED ORDER — ESTRADIOL 0.1 MG/GM VA CREA: TOPICAL_CREAM | VAGINAL | 12 refills | Status: DC

## 2018-02-13 NOTE — Patient Instructions (Signed)
Return to clinic for any scheduled appointments or for any gynecologic concerns as needed.   

## 2018-02-13 NOTE — Progress Notes (Signed)
GYNECOLOGY OFFICE VISIT NOTE  History:  63 y.o. G2P1001 here today for evaluation of suprapubic pain that occurs a few days after intercourse.  Was seen by PCP for this pain. Reports that the pain has happened twice, worsening with walking and standing.  Had positive UA, but negative urine culture in 01/2018.  She denies any abnormal vaginal discharge, bleeding, dysuria, diarrhea, constipation or other concerns. She is worried about vaginal dryness, feels that treating this will help her symptoms. Her PCP ordered a pelvic scan to rule out any gynecologic anatomic anomaly and told her to follow up here.  Past Medical History:  Diagnosis Date  . Benign neoplasm of colon   . Conjunctival hemorrhage   . Diverticulosis of colon (without mention of hemorrhage)    colonoscopy 8/02, 03/11/07 abnormal  . Esophageal reflux    EGD positive 10/06  . Hiatal hernia   . Impacted cerumen   . Papanicolaou smear of cervix with atypical squamous cells of undetermined significance (ASC-US)    colposcopy 3/07, ASCUS  . Plantar fascial fibromatosis   . Postmenopausal HRT (hormone replacement therapy)   . Routine general medical examination at a health care facility   . Stricture and stenosis of esophagus     Past Surgical History:  Procedure Laterality Date  . CESAREAN SECTION     x 2  . ETHMOIDECTOMY      The following portions of the patient's history were reviewed and updated as appropriate: allergies, current medications, past family history, past medical history, past social history, past surgical history and problem list.   Health Maintenance:  Normal pap and negative HRHPV on 03/13/2017.  Normal mammogram on 07/08/2017.   Review of Systems:  Pertinent items noted in HPI and remainder of comprehensive ROS otherwise negative.  Objective:  Physical Exam BP (!) 152/86   Pulse 72   Wt 178 lb (80.7 kg)   BMI 29.62 kg/m  CONSTITUTIONAL: Well-developed, well-nourished female in no acute distress.   HEENT:  Normocephalic, atraumatic. External right and left ear normal. No scleral icterus.  NECK: Normal range of motion, supple, no masses noted on observation SKIN: No rash noted. Not diaphoretic. No erythema. No pallor. NEUROLOGIC: Alert and oriented to person, place, and time. Normal muscle tone coordination. No cranial nerve deficit noted. PSYCHIATRIC: Normal mood and affect. Normal behavior. Normal judgment and thought content. CARDIOVASCULAR: Normal heart rate noted RESPIRATORY: Effort and breath sounds normal, no problems with respiration noted ABDOMEN: No masses noted. No other overt distention noted.  +Suprapubic tenderness.  MUSCULOSKELETAL: Normal range of motion. +Point tenderness on pubis symphysis.  No edema noted. PELVIC: Normal appearing external genitalia with moderate atrophy; atrophic vaginal mucosa and cervix.  No abnormal discharge noted. Pain elicited on palpation of anterior vagina, not on bilateral or posterior components. Small uterine size, no other palpable masses, no cervical, uterine or adnexal tenderness.  Labs and Imaging US Pelvic Complete With Transvaginal  Result Date: 01/29/2018 CLINICAL DATA:  Suprapubic pain. EXAM: TRANSABDOMINAL AND TRANSVAGINAL ULTRASOUND OF PELVIS TECHNIQUE: Both transabdominal and transvaginal ultrasound examinations of the pelvis were performed. Transabdominal technique was performed for global imaging of the pelvis including uterus, ovaries, adnexal regions, and pelvic cul-de-sac. It was necessary to proceed with endovaginal exam following the transabdominal exam to visualize the endometrium and ovaries. COMPARISON:  None FINDINGS: Uterus Measurements: 6.8 x 2.1 x 3 cm = volume: 22.8 mL. There is a 9 x 7 x 8 mm partially calcified mass in the lower uterine segment,  immediately deep to a C-section scar. Endometrium Thickness: 3 mm.  No focal abnormality visualized. Right ovary Measurements: 1.6 x 1.1 x 1.1 cm = volume: 1 mL. Normal  appearance/no adnexal mass. Left ovary Measurements: 1.7 x 0.8 x 1 cm = volume: 0.7 mL. Normal appearance/no adnexal mass. Other findings No abnormal free fluid. IMPRESSION: 1. There is a small 9 x 7 x 8 mm mass, partially calcified, deep to the C-section scar in the lower uterine segment. This is likely related to previous C-section representing scarring. A small partially calcified fibroid is a possibility. 2. No acute abnormalities noted. Electronically Signed   By: Dorise Bullion III M.D   On: 01/29/2018 13:14    Assessment & Plan:  1. Vaginal atrophy 2. Dyspareunia, female Will treat vaginal atrophy with estrogen cream but her pain is only anterior. Will discuss this further below.  Reevaluate in one month. Of note, small fibroid/scar tissue in lower uterine segment is not responsible for this pain, this has been present for decades and not associated with acute pain.  - estradiol (ESTRACE) 0.1 MG/GM vaginal cream; Apply 1 gram per vagina every night for 2 weeks, then apply three times a week  Dispense: 30 g; Refill: 12   3. Suprapubic pain 4. Pain of female symphysis pubis Point tenderness on palpation of pubis symphysis and palpation of bladder. Negative urinary evaluation by her PCP.  Pain could be due to nerves in this area; exacerbated by intercourse.  Recommended to take NSAIDs as needed, also trial of Neurotin. She declines Neurontin for now. If point tenderness continues, may need to reconsider Neurontin, or consider pelvic floor therapy or other evaluation.   Routine preventative health maintenance measures emphasized. Please refer to After Visit Summary for other counseling recommendations.   Return in about 1 month (around 03/14/2018) for Followup visit.   Total face-to-face time with patient: 25 minutes.  Over 50% of encounter was spent on counseling and coordination of care.   Verita Schneiders, MD, [place] [facility] [employer], [place]

## 2018-03-11 ENCOUNTER — Ambulatory Visit: Payer: BC Managed Care – PPO | Admitting: Obstetrics & Gynecology

## 2018-03-27 ENCOUNTER — Ambulatory Visit: Payer: BC Managed Care – PPO | Admitting: Obstetrics & Gynecology

## 2018-07-26 IMAGING — US US PELVIS COMPLETE WITH TRANSVAGINAL
1 series · 13 of 25 positions shown · non-contrast
Comparison: None

CLINICAL DATA: Suprapubic pain.

EXAM:
TRANSABDOMINAL AND TRANSVAGINAL ULTRASOUND OF PELVIS
TECHNIQUE: Both transabdominal and transvaginal ultrasound examinations of the
pelvis were performed. Transabdominal technique was performed for
global imaging of the pelvis including uterus, ovaries, adnexal
regions, and pelvic cul-de-sac. It was necessary to proceed with
endovaginal exam following the transabdominal exam to visualize the
endometrium and ovaries.

[Series 1: us pelvis complete with transvaginal · 0.23mm/px · 13 of 51 slices shown]
[im 1/51]
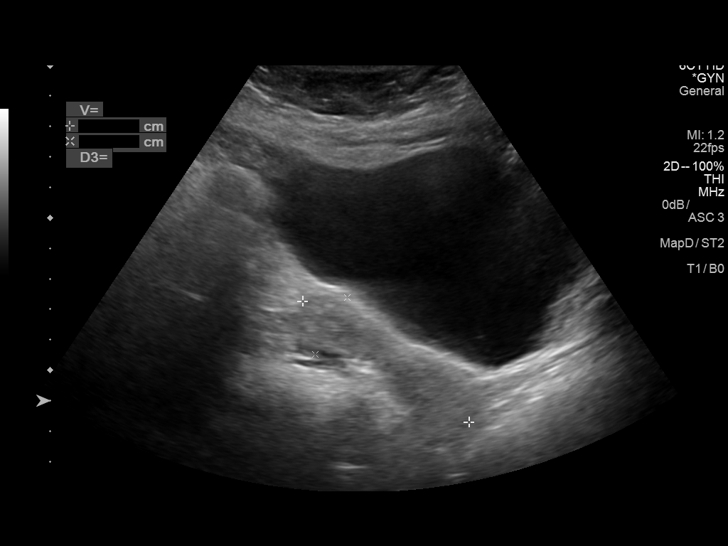
[im 5/51]
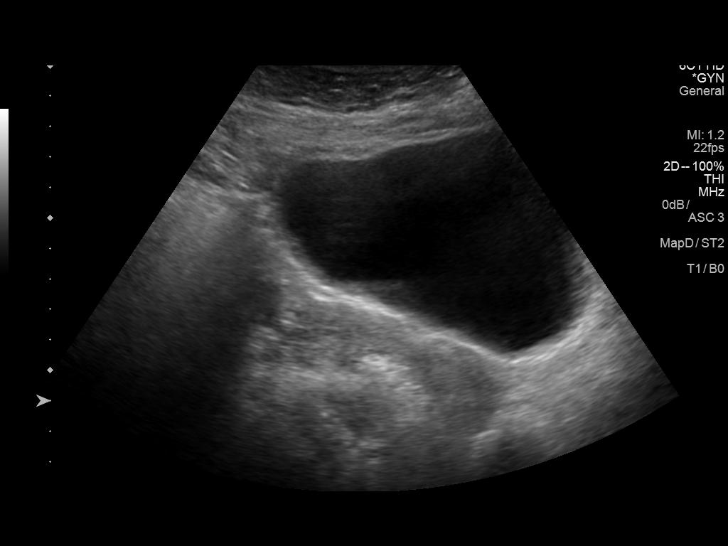
[im 9/51]
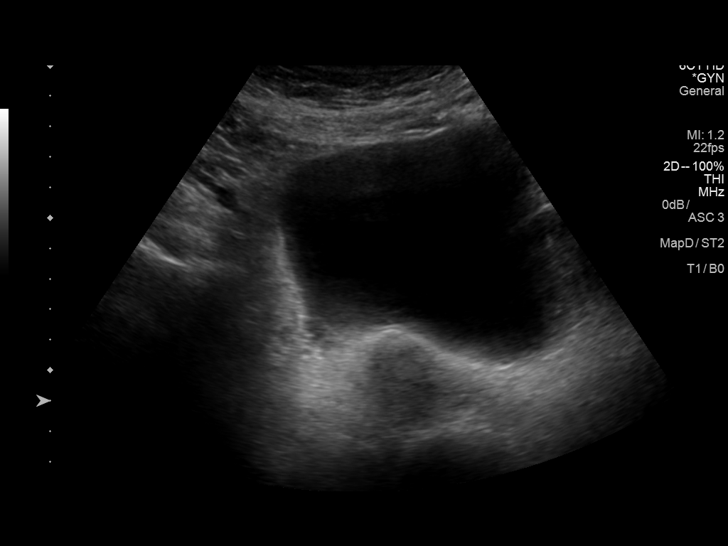
[im 13/51]
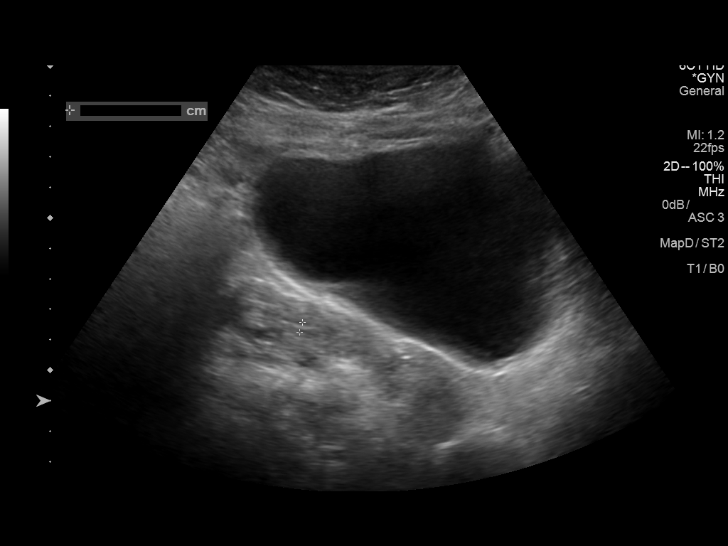
[im 17/51]
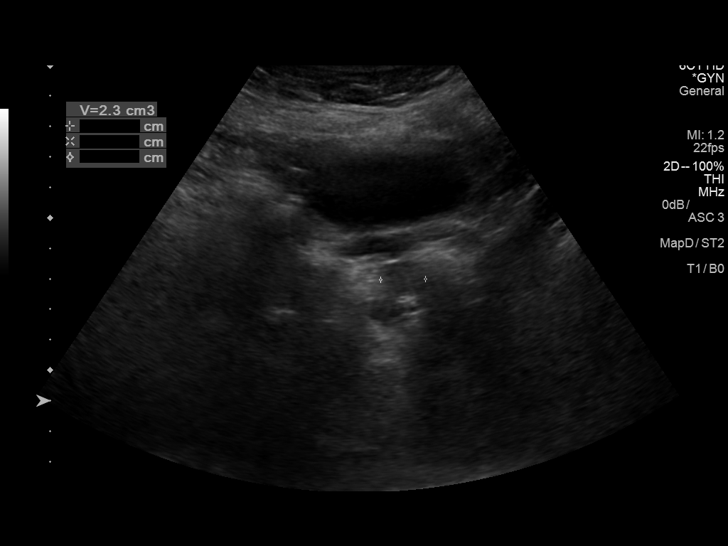
[im 21/51]
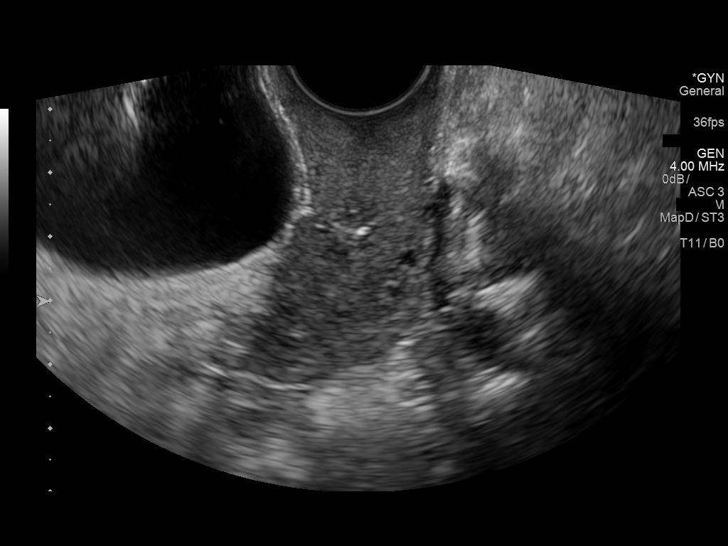
[im 26/51]
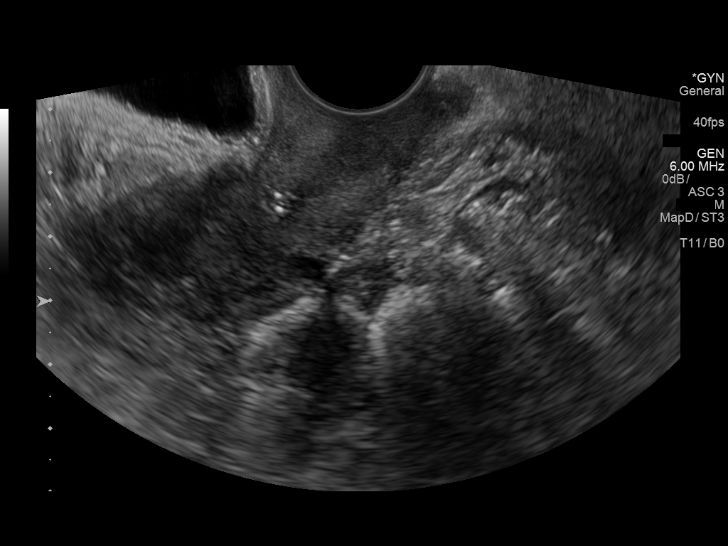
[im 30/51]
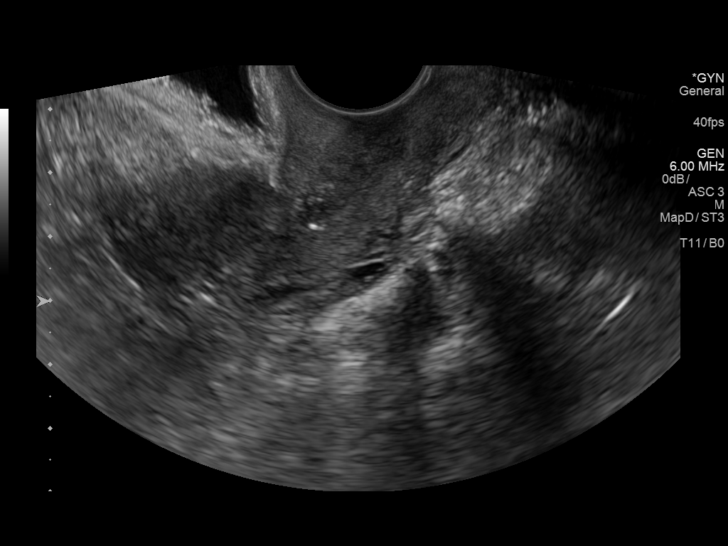
[im 34/51]
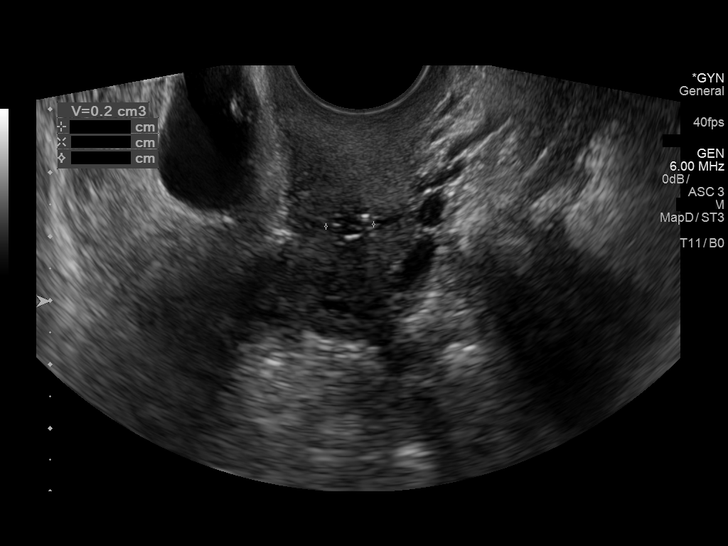
[im 38/51]
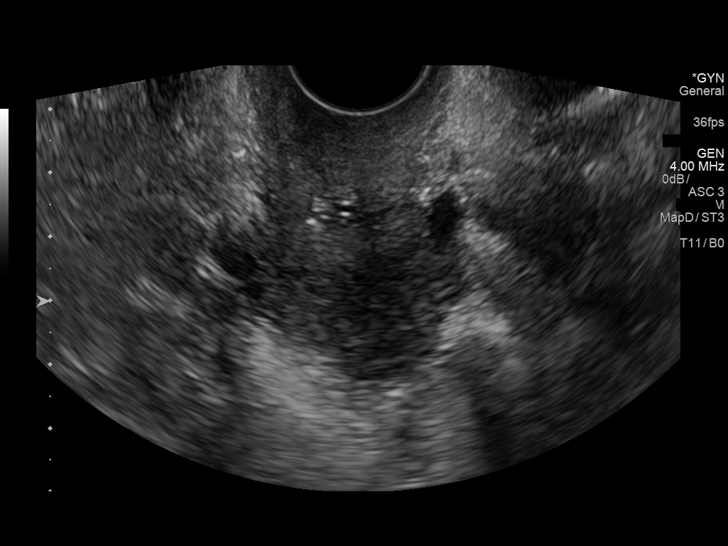
[im 42/51]
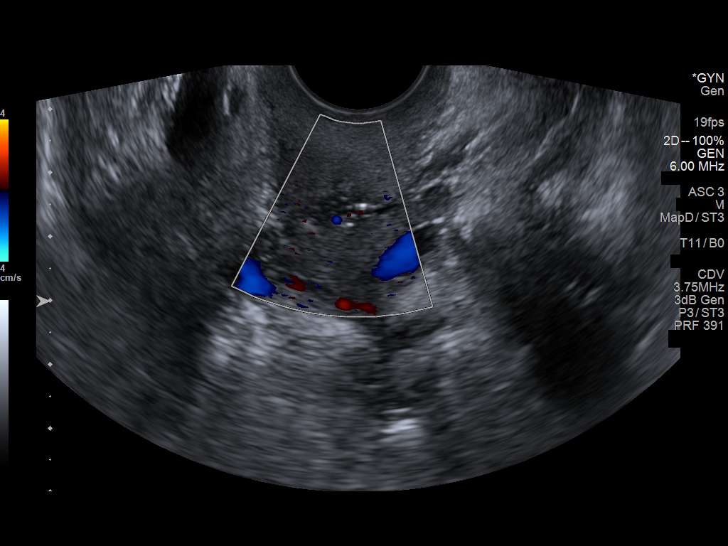
[im 46/51]
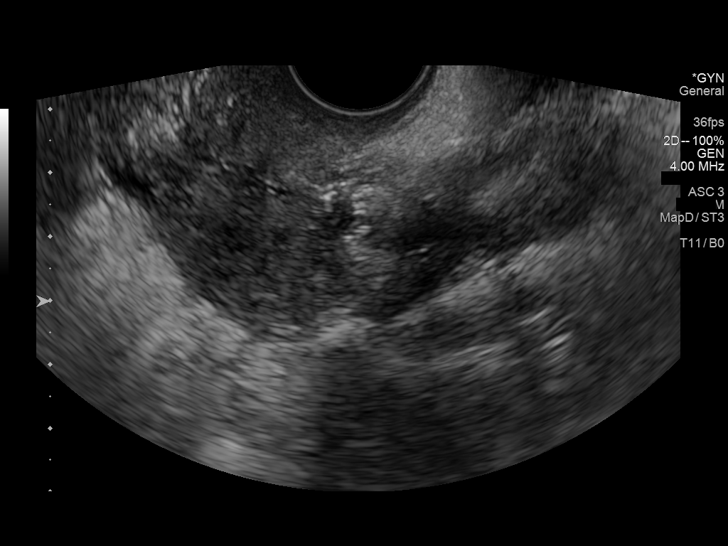
[im 51/51]
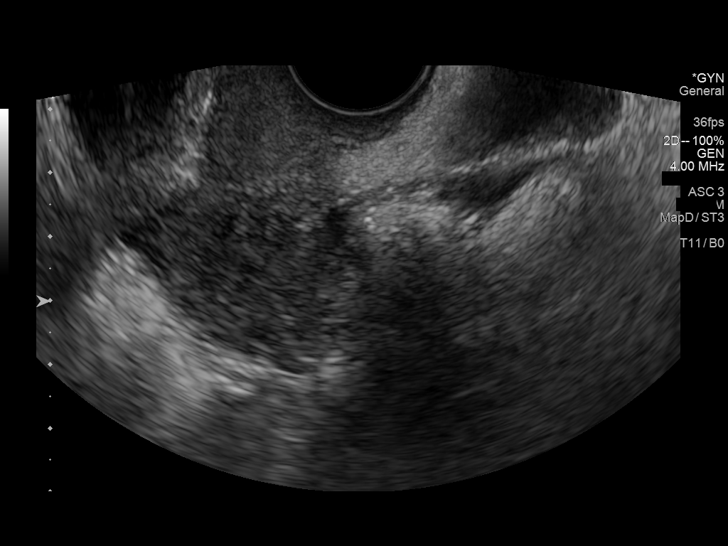

[13 of 25 positions shown; findings below may reference images not displayed]

FINDINGS: Uterus

Measurements: 6.8 x 2.1 x 3 cm = volume: 22.8 mL. There is a 9 x 7 x
8 mm partially calcified mass in the lower uterine segment,
immediately deep to a C-section scar.

Endometrium

Thickness: 3 mm.  No focal abnormality visualized.

Right ovary

Measurements: 1.6 x 1.1 x 1.1 cm = volume: 1 mL. Normal
appearance/no adnexal mass.

Left ovary

Measurements: 1.7 x 0.8 x 1 cm = volume: 0.7 mL. Normal
appearance/no adnexal mass.

Other findings

No abnormal free fluid.
IMPRESSION: 1. There is a small 9 x 7 x 8 mm mass, partially calcified, deep to
the C-section scar in the lower uterine segment. This is likely
related to previous C-section representing scarring. A small
partially calcified fibroid is a possibility.
2. No acute abnormalities noted.

## 2018-09-10 ENCOUNTER — Ambulatory Visit (INDEPENDENT_AMBULATORY_CARE_PROVIDER_SITE_OTHER): Payer: BC Managed Care – PPO

## 2018-09-10 DIAGNOSIS — Z23 Encounter for immunization: Secondary | ICD-10-CM | POA: Diagnosis not present

## 2019-08-10 DIAGNOSIS — M50322 Other cervical disc degeneration at C5-C6 level: Secondary | ICD-10-CM | POA: Diagnosis not present

## 2019-08-10 DIAGNOSIS — M5134 Other intervertebral disc degeneration, thoracic region: Secondary | ICD-10-CM | POA: Diagnosis not present

## 2019-08-10 DIAGNOSIS — M9901 Segmental and somatic dysfunction of cervical region: Secondary | ICD-10-CM | POA: Diagnosis not present

## 2019-08-10 DIAGNOSIS — M9902 Segmental and somatic dysfunction of thoracic region: Secondary | ICD-10-CM | POA: Diagnosis not present

## 2019-08-13 DIAGNOSIS — M5134 Other intervertebral disc degeneration, thoracic region: Secondary | ICD-10-CM | POA: Diagnosis not present

## 2019-08-13 DIAGNOSIS — M50322 Other cervical disc degeneration at C5-C6 level: Secondary | ICD-10-CM | POA: Diagnosis not present

## 2019-08-13 DIAGNOSIS — M9902 Segmental and somatic dysfunction of thoracic region: Secondary | ICD-10-CM | POA: Diagnosis not present

## 2019-08-13 DIAGNOSIS — M9901 Segmental and somatic dysfunction of cervical region: Secondary | ICD-10-CM | POA: Diagnosis not present

## 2019-08-17 DIAGNOSIS — M5134 Other intervertebral disc degeneration, thoracic region: Secondary | ICD-10-CM | POA: Diagnosis not present

## 2019-08-17 DIAGNOSIS — M9901 Segmental and somatic dysfunction of cervical region: Secondary | ICD-10-CM | POA: Diagnosis not present

## 2019-08-17 DIAGNOSIS — M50322 Other cervical disc degeneration at C5-C6 level: Secondary | ICD-10-CM | POA: Diagnosis not present

## 2019-08-17 DIAGNOSIS — M9902 Segmental and somatic dysfunction of thoracic region: Secondary | ICD-10-CM | POA: Diagnosis not present

## 2019-09-01 ENCOUNTER — Ambulatory Visit (INDEPENDENT_AMBULATORY_CARE_PROVIDER_SITE_OTHER): Payer: Medicare HMO

## 2019-09-01 ENCOUNTER — Other Ambulatory Visit: Payer: Self-pay

## 2019-09-01 ENCOUNTER — Ambulatory Visit (INDEPENDENT_AMBULATORY_CARE_PROVIDER_SITE_OTHER): Payer: Medicare HMO | Admitting: Family Medicine

## 2019-09-01 ENCOUNTER — Encounter: Payer: Self-pay | Admitting: Family Medicine

## 2019-09-01 DIAGNOSIS — M25511 Pain in right shoulder: Secondary | ICD-10-CM | POA: Diagnosis not present

## 2019-09-01 DIAGNOSIS — G8929 Other chronic pain: Secondary | ICD-10-CM | POA: Diagnosis not present

## 2019-09-01 MED ORDER — TRAMADOL HCL 50 MG PO TABS: 50.0000 mg | ORAL_TABLET | Freq: Four times a day (QID) | ORAL | 0 refills | Status: DC | PRN

## 2019-09-01 NOTE — Progress Notes (Signed)
Office Visit Note   Patient: Penny Gardner           Date of Birth: 11-08-1955           MRN: [id number] Visit Date: 09/01/2019 Requested by: Pleas Koch, NP [address] [place],  [place] [address] PCP: Eunice Blase, MD  Subjective: Chief Complaint  Patient presents with  . Right Shoulder - Pain    Pain x 8 months, worsening over the past 2 months. NKI. Has been seeing a chiropractor for her neck - also worked on the shoulder. Neck is better, but shoulder is not.    HPI: He is here with right shoulder pain.  Symptoms started about 8 months ago, no injury.  Around that time she was starting an exercise regimen that occasionally involved reaching her arms overhead.  She does not think she hurt it, but after a while it started to hurt when doing that motion.  It has continued to bother her every time she reaches overhead in a certain way.  Pain shoots down her arm toward the hand.  It quickly stops hurting when she gets out of that position.  No numbness or tingling in her arm.  She went to Dr. Teryl Lucy for chiropractic treatments of her neck and back in case some of his pain was from her spine, but it did not seem to make much difference with her pain.  She has tried ibuprofen as well.  Minimal improvement with that.  She is right-hand dominant, no previous problems with her shoulder.  She also would like to establish here for primary care.  One of her friends is a patient of our practice.               ROS:   All other systems were reviewed and are negative.  Objective: Vital Signs: There were no vitals taken for this visit.  Physical Exam:  General:  Alert and oriented, in no acute distress. Pulm:  Breathing unlabored. Psy:  Normal mood, congruent affect.  Right shoulder: Full active range of motion with no adhesive capsulitis.  No significant tenderness at the [facility] joint, negative crossover test.  No tenderness at the long head biceps tendon and negative speeds test.   Rotator cuff strength is 5/5 throughout.  She has pain with passive abduction and external rotation at the extreme.  This seems to reproduce her pain.  She does not hurt with palpation of the subacromial space.   Imaging: XR Shoulder Right  Result Date: 09/01/2019 X-rays of the right shoulder reveal early spurring on the humeral head inferiorly.  There is mild AC joint degenerative change.  No soft tissue calcifications, no sign of neoplasm.   Assessment & Plan: 1.  Chronic right shoulder pain, suspect subacromial bursitis. -Discussed options with her and elected to inject the subacromial space today.  If she fails to improve, she will contact me and I will order MRI scan.     Procedures: Right shoulder subacromial injection: After sterile prep with Betadine, injected 5 cc 1% lidocaine without epinephrine and 40 mg of methylprednisolone from posterior approach.    PMFS History: Patient Active Problem List   Diagnosis Date Noted  . Suprapubic pain 01/29/2018  . Left upper eyelid ulcer 10/29/2017  . Ganglion cyst 08/25/2014  . HLD (hyperlipidemia) 08/04/2013  . Encounter for routine gynecological examination 08/04/2013  . Routine general medical examination at a health care facility 08/02/2011  . Diverticulosis of colon 03/11/2007  .  GERD 10/08/2004   Past Medical History:  Diagnosis Date  . Benign neoplasm of colon   . Conjunctival hemorrhage   . Diverticulosis of colon (without mention of hemorrhage)    colonoscopy 8/02, 03/11/07 abnormal  . Esophageal reflux    EGD positive 10/06  . Hiatal hernia   . Impacted cerumen   . Papanicolaou smear of cervix with atypical squamous cells of undetermined significance (ASC-US)    colposcopy 3/07, ASCUS  . Plantar fascial fibromatosis   . Postmenopausal HRT (hormone replacement therapy)   . Routine general medical examination at a health care facility   . Stricture and stenosis of esophagus     Family History  Problem Relation  Age of Onset  . Cancer Mother        colon  . Diabetes Father   . Cancer Maternal Uncle        colon  . Diabetes Paternal Grandmother     Past Surgical History:  Procedure Laterality Date  . CESAREAN SECTION     x 2  . ETHMOIDECTOMY     Social History   Occupational History  . Not on file  Tobacco Use  . Smoking status: Never Smoker  . Smokeless tobacco: Never Used  Vaping Use  . Vaping Use: Never used  Substance and Sexual Activity  . Alcohol use: Yes    Comment: rare  . Drug use: No  . Sexual activity: Yes

## 2019-09-07 ENCOUNTER — Encounter: Payer: Self-pay | Admitting: Family Medicine

## 2019-09-07 ENCOUNTER — Telehealth: Payer: Self-pay | Admitting: Family Medicine

## 2019-09-07 DIAGNOSIS — G8929 Other chronic pain: Secondary | ICD-10-CM

## 2019-09-07 NOTE — Telephone Encounter (Signed)
Ordered

## 2019-09-07 NOTE — Telephone Encounter (Signed)
Patient called. She would like to have a MRI, she is not any better.

## 2019-09-07 NOTE — Telephone Encounter (Signed)
Duplicate message - see MyChart message from today.

## 2019-09-27 ENCOUNTER — Other Ambulatory Visit: Payer: Medicare HMO

## 2019-09-28 MED ORDER — DIAZEPAM 5 MG PO TABS: ORAL_TABLET | ORAL | 0 refills | Status: DC

## 2019-09-28 NOTE — Addendum Note (Signed)
Addended by: Hortencia Pilar on: 09/28/2019 08:13 AM   Modules accepted: Orders

## 2019-10-02 ENCOUNTER — Telehealth: Payer: Self-pay | Admitting: Family Medicine

## 2019-10-02 ENCOUNTER — Ambulatory Visit
Admission: RE | Admit: 2019-10-02 | Discharge: 2019-10-02 | Disposition: A | Discharge status: Home / Self Care | Payer: Medicare HMO | Source: Ambulatory Visit | Attending: Family Medicine | Admitting: Family Medicine

## 2019-10-02 ENCOUNTER — Other Ambulatory Visit: Payer: Self-pay

## 2019-10-02 DIAGNOSIS — M25511 Pain in right shoulder: Secondary | ICD-10-CM | POA: Diagnosis not present

## 2019-10-02 DIAGNOSIS — G8929 Other chronic pain: Secondary | ICD-10-CM

## 2019-10-02 IMAGING — MR MR SHOULDER*R* W/O CM
4 of 5 series · 30 of 40 positions shown · non-contrast
Comparison: None.

CLINICAL DATA: Right shoulder pain radiating down the right arm

EXAM:
MRI OF THE RIGHT SHOULDER WITHOUT CONTRAST
TECHNIQUE: Multiplanar, multisequence MR imaging of the shoulder was performed.
No intravenous contrast was administered.

[Series 3: PD fat-sat · axial · 4.0mm · 0.27mm/px · z∈[-33,+70]mm · 8 of 23 slices shown (1 of 2)]
[im 1/23]
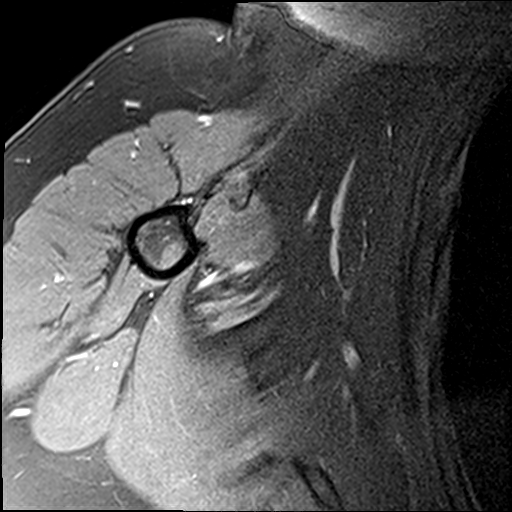
[im 3/23]
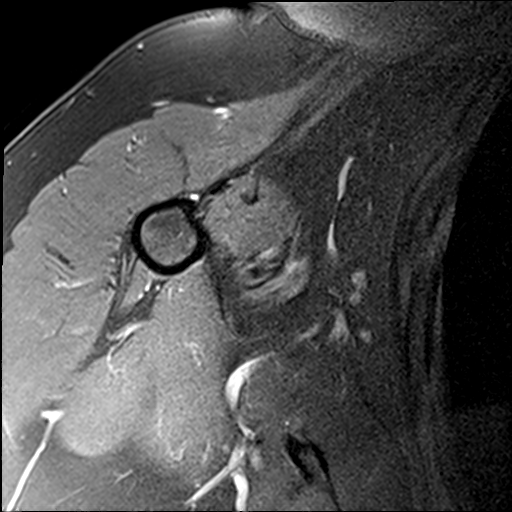
[im 8/23]
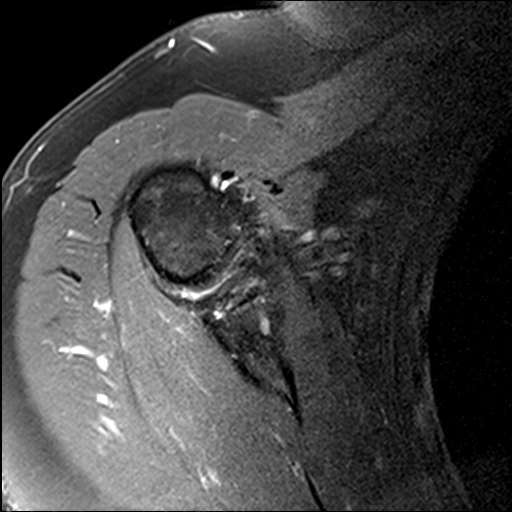
[im 10/23]
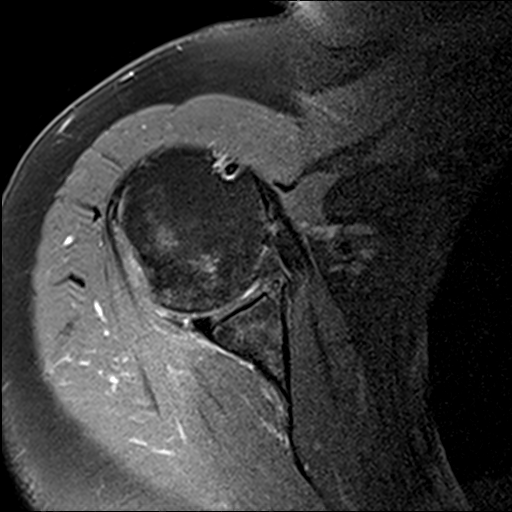
[im 13/23]
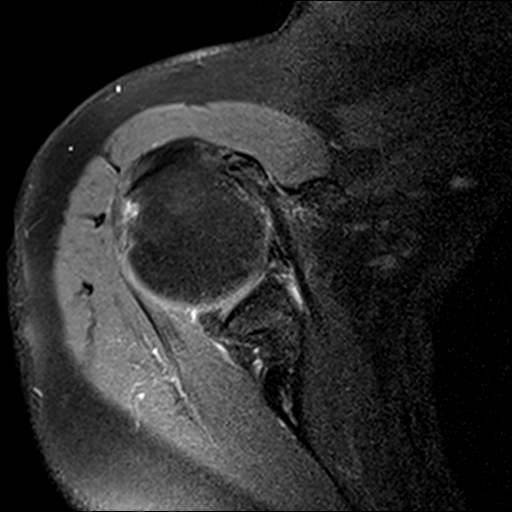
[im 15/23]
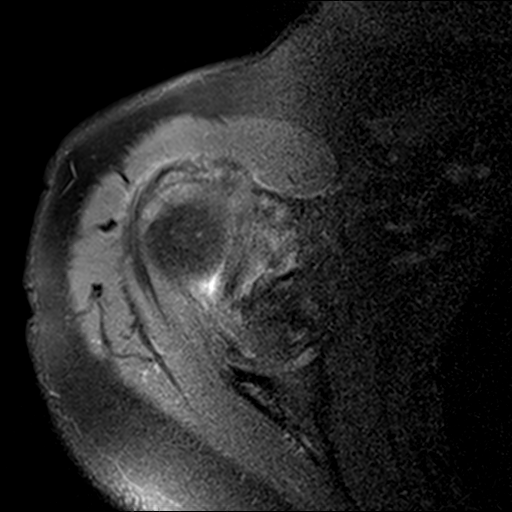
[im 20/23]
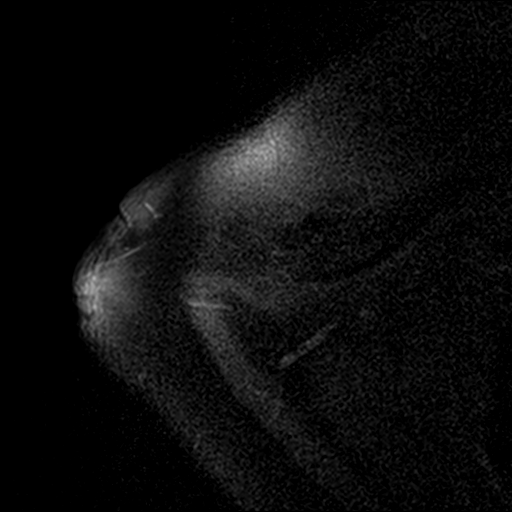
[im 23/23]
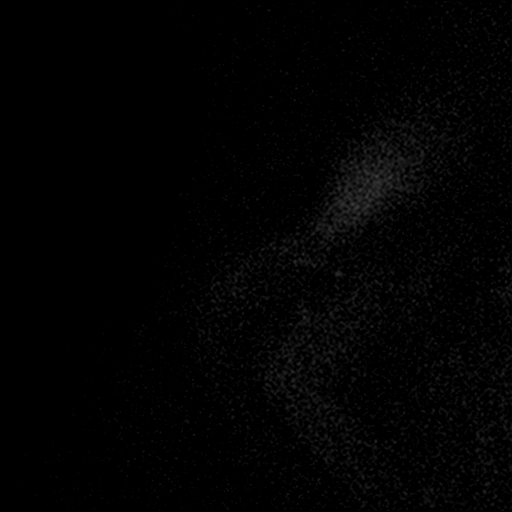

[Series 5: T2 fat-sat · coronal · 4.0mm · 0.55mm/px · 8 of 19 slices shown (1 of 2)]
[im 1/19]
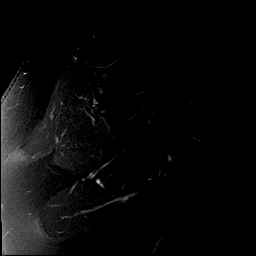
[im 3/19]
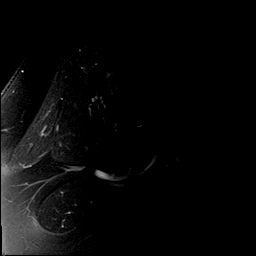
[im 6/19]
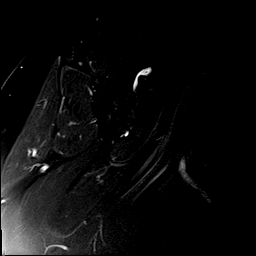
[im 8/19]
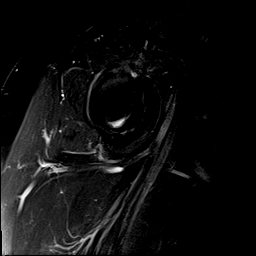
[im 11/19]
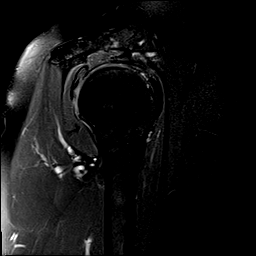
[im 13/19]
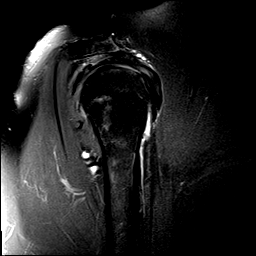
[im 16/19]
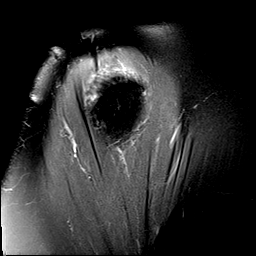
[im 19/19]
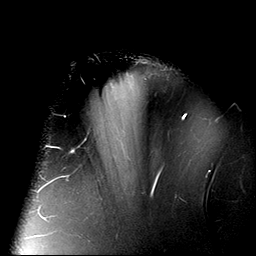

[Series 6: T2 fat-sat · oblique · 4.0mm · 0.55mm/px · 7 of 18 slices shown (2 of 2)]
[im 1/18]
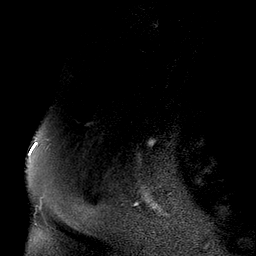
[im 3/18]
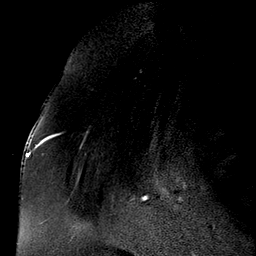
[im 6/18]
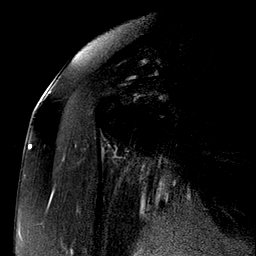
[im 9/18]
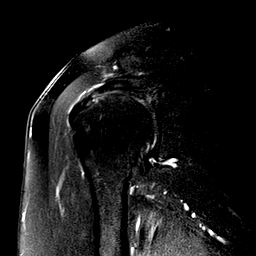
[im 12/18]
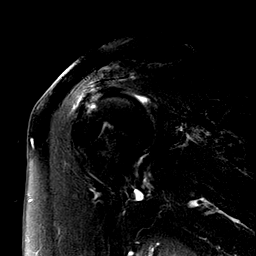
[im 15/18]
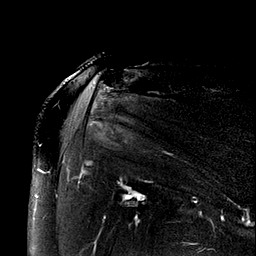
[im 18/18]
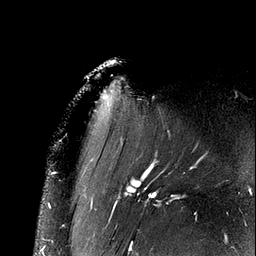

[Series 7: PD fat-sat · oblique · 4.0mm · 0.27mm/px · 7 of 18 slices shown (2 of 2)]
[im 1/18]
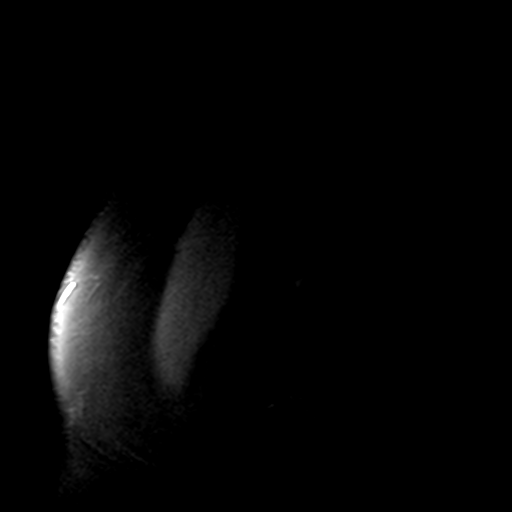
[im 3/18]
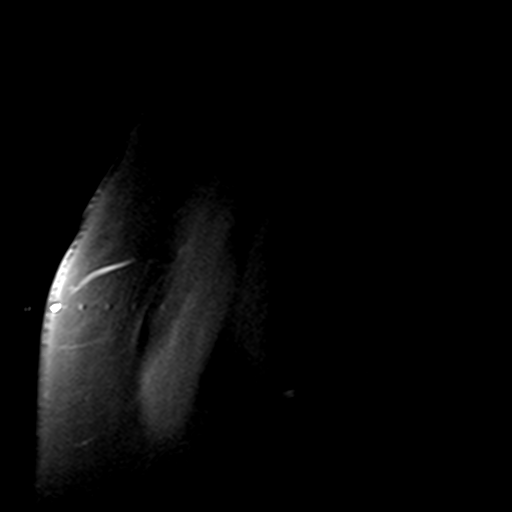
[im 6/18]
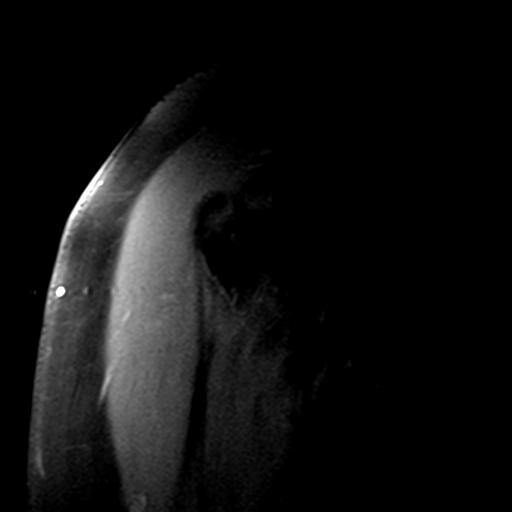
[im 9/18]
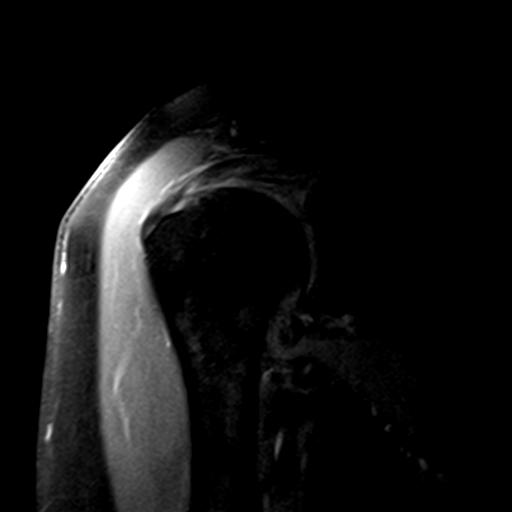
[im 12/18]
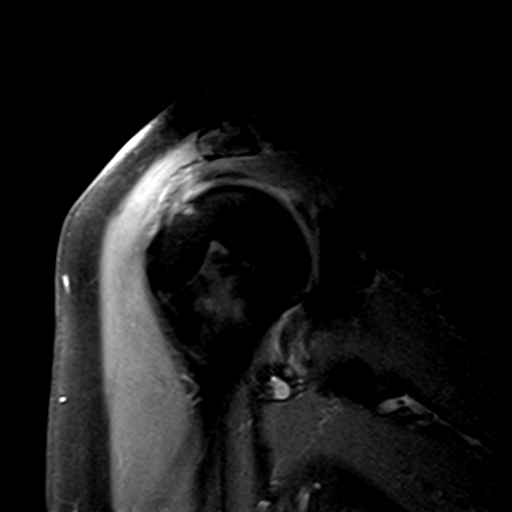
[im 15/18]
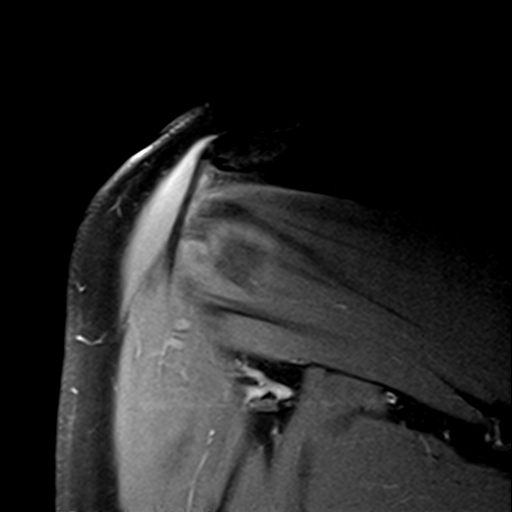
[im 18/18]
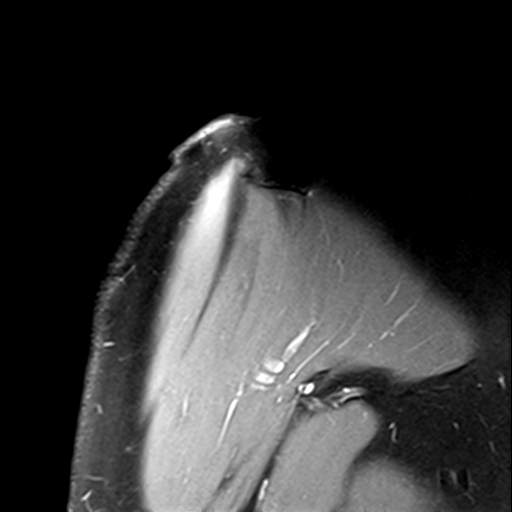

[30 of 40 positions shown; findings below may reference images not displayed]

FINDINGS: Rotator cuff: Moderate tendinosis of the supraspinatus tendon. Mild
tendinosis of the infraspinatus tendon. Teres minor tendon is
intact. Subscapularis tendon is intact.

Muscles: No muscle atrophy or edema. No intramuscular fluid
collection or hematoma.

Biceps Long Head: Intraarticular and extraarticular portions of the
biceps tendon are intact.

Acromioclavicular Joint: Mild arthropathy of the acromioclavicular
joint. Type I acromion. Trace subacromial/subdeltoid bursal fluid.

Glenohumeral Joint: No joint effusion. No chondral defect.

Labrum: Grossly intact, but evaluation is limited by lack of
intraarticular fluid/contrast.

Bones: No fracture or dislocation. No aggressive osseous lesion.

Other: No fluid collection or hematoma.
IMPRESSION: 1. Moderate tendinosis of the supraspinatus tendon.
2. Mild tendinosis of the infraspinatus tendon.

## 2019-10-02 NOTE — Telephone Encounter (Signed)
Rotator cuff is irritated but not torn.

## 2019-10-13 DIAGNOSIS — R69 Illness, unspecified: Secondary | ICD-10-CM | POA: Diagnosis not present

## 2019-10-14 DIAGNOSIS — M25511 Pain in right shoulder: Secondary | ICD-10-CM | POA: Diagnosis not present

## 2019-10-27 DIAGNOSIS — M25511 Pain in right shoulder: Secondary | ICD-10-CM | POA: Diagnosis not present

## 2019-11-03 DIAGNOSIS — M25511 Pain in right shoulder: Secondary | ICD-10-CM | POA: Diagnosis not present

## 2019-11-18 DIAGNOSIS — M25511 Pain in right shoulder: Secondary | ICD-10-CM | POA: Diagnosis not present

## 2019-12-01 DIAGNOSIS — M542 Cervicalgia: Secondary | ICD-10-CM | POA: Diagnosis not present

## 2019-12-01 DIAGNOSIS — M25511 Pain in right shoulder: Secondary | ICD-10-CM | POA: Diagnosis not present

## 2019-12-01 DIAGNOSIS — M25551 Pain in right hip: Secondary | ICD-10-CM | POA: Diagnosis not present

## 2020-01-28 ENCOUNTER — Encounter (HOSPITAL_COMMUNITY): Payer: Self-pay | Admitting: Emergency Medicine

## 2020-01-28 ENCOUNTER — Emergency Department (HOSPITAL_COMMUNITY)
Admission: EM | Admit: 2020-01-28 | Discharge: 2020-01-28 | Disposition: A | Discharge status: Home / Self Care | Payer: Medicare HMO | Attending: Emergency Medicine | Admitting: Emergency Medicine

## 2020-01-28 ENCOUNTER — Emergency Department (HOSPITAL_COMMUNITY): Payer: Medicare HMO

## 2020-01-28 DIAGNOSIS — Z041 Encounter for examination and observation following transport accident: Secondary | ICD-10-CM | POA: Diagnosis not present

## 2020-01-28 DIAGNOSIS — R52 Pain, unspecified: Secondary | ICD-10-CM | POA: Diagnosis not present

## 2020-01-28 DIAGNOSIS — Y92410 Unspecified street and highway as the place of occurrence of the external cause: Secondary | ICD-10-CM | POA: Diagnosis not present

## 2020-01-28 DIAGNOSIS — Z5321 Procedure and treatment not carried out due to patient leaving prior to being seen by health care provider: Secondary | ICD-10-CM | POA: Diagnosis not present

## 2020-01-28 DIAGNOSIS — S8990XA Unspecified injury of unspecified lower leg, initial encounter: Secondary | ICD-10-CM | POA: Diagnosis not present

## 2020-01-28 DIAGNOSIS — I10 Essential (primary) hypertension: Secondary | ICD-10-CM | POA: Diagnosis not present

## 2020-01-28 DIAGNOSIS — Z743 Need for continuous supervision: Secondary | ICD-10-CM | POA: Diagnosis not present

## 2020-01-28 DIAGNOSIS — S8010XA Contusion of unspecified lower leg, initial encounter: Secondary | ICD-10-CM | POA: Insufficient documentation
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
## 2020-01-28 NOTE — ED Notes (Signed)
Pt stated they were leaving  
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
## 2020-01-29 ENCOUNTER — Telehealth: Payer: Self-pay

## 2020-01-29 MED ORDER — TRAMADOL HCL 50 MG PO TABS: 50.0000 mg | ORAL_TABLET | Freq: Four times a day (QID) | ORAL | 0 refills | Status: DC | PRN

## 2020-01-29 NOTE — Addendum Note (Signed)
Addended by: Hortencia Pilar on: 01/29/2020 12:00 PM   Modules accepted: Orders

## 2020-01-29 NOTE — Telephone Encounter (Signed)
I called the patient and advised her about the tylenol/ibuprofen alternation and she has an Rx for Tramadol now, in case she needs it for the more severe pain. She will continue to elevate the legs and periodically ice them. Advised her that there is always a doctor on call for the office if she needs advice over the weekend. Will see her at [facility] appointment.
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
## 2020-02-01 ENCOUNTER — Encounter: Payer: Self-pay | Admitting: Family Medicine

## 2020-02-01 ENCOUNTER — Other Ambulatory Visit: Payer: Self-pay

## 2020-02-01 ENCOUNTER — Ambulatory Visit: Payer: Medicare HMO | Admitting: Family Medicine

## 2020-02-01 VITALS — Ht 65.0 in | Wt 175.0 lb

## 2020-02-01 DIAGNOSIS — M79602 Pain in left arm: Secondary | ICD-10-CM | POA: Diagnosis not present

## 2020-02-01 DIAGNOSIS — M79601 Pain in right arm: Secondary | ICD-10-CM

## 2020-02-01 DIAGNOSIS — M79605 Pain in left leg: Secondary | ICD-10-CM

## 2020-02-01 DIAGNOSIS — M25512 Pain in left shoulder: Secondary | ICD-10-CM

## 2020-02-01 DIAGNOSIS — M545 Low back pain, unspecified: Secondary | ICD-10-CM | POA: Diagnosis not present

## 2020-02-01 DIAGNOSIS — M79604 Pain in right leg: Secondary | ICD-10-CM | POA: Diagnosis not present
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
## 2020-02-02 ENCOUNTER — Ambulatory Visit: Payer: Medicare HMO | Admitting: Family Medicine

## 2020-02-02 ENCOUNTER — Encounter: Payer: Self-pay | Admitting: Family Medicine

## 2020-02-02 MED ORDER — DOXYCYCLINE HYCLATE 100 MG PO CAPS: 100.0000 mg | ORAL_CAPSULE | Freq: Two times a day (BID) | ORAL | 0 refills | Status: DC

## 2020-02-09 DIAGNOSIS — M25532 Pain in left wrist: Secondary | ICD-10-CM | POA: Diagnosis not present

## 2020-02-09 DIAGNOSIS — M25512 Pain in left shoulder: Secondary | ICD-10-CM | POA: Diagnosis not present

## 2020-02-09 DIAGNOSIS — M25531 Pain in right wrist: Secondary | ICD-10-CM | POA: Diagnosis not present

## 2020-02-09 DIAGNOSIS — M545 Low back pain, unspecified: Secondary | ICD-10-CM | POA: Diagnosis not present

## 2020-02-15 DIAGNOSIS — M25531 Pain in right wrist: Secondary | ICD-10-CM | POA: Diagnosis not present

## 2020-02-15 DIAGNOSIS — M25532 Pain in left wrist: Secondary | ICD-10-CM | POA: Diagnosis not present

## 2020-02-15 DIAGNOSIS — M25512 Pain in left shoulder: Secondary | ICD-10-CM | POA: Diagnosis not present

## 2020-02-15 DIAGNOSIS — M545 Low back pain, unspecified: Secondary | ICD-10-CM | POA: Diagnosis not present

## 2020-02-23 DIAGNOSIS — M25532 Pain in left wrist: Secondary | ICD-10-CM | POA: Diagnosis not present

## 2020-02-23 DIAGNOSIS — M545 Low back pain, unspecified: Secondary | ICD-10-CM | POA: Diagnosis not present

## 2020-02-23 DIAGNOSIS — M25531 Pain in right wrist: Secondary | ICD-10-CM | POA: Diagnosis not present

## 2020-02-23 DIAGNOSIS — M25512 Pain in left shoulder: Secondary | ICD-10-CM | POA: Diagnosis not present

## 2020-02-29 DIAGNOSIS — M25531 Pain in right wrist: Secondary | ICD-10-CM | POA: Diagnosis not present

## 2020-02-29 DIAGNOSIS — M25512 Pain in left shoulder: Secondary | ICD-10-CM | POA: Diagnosis not present

## 2020-02-29 DIAGNOSIS — M545 Low back pain, unspecified: Secondary | ICD-10-CM | POA: Diagnosis not present

## 2020-02-29 DIAGNOSIS — M25532 Pain in left wrist: Secondary | ICD-10-CM | POA: Diagnosis not present

## 2020-03-08 DIAGNOSIS — M25512 Pain in left shoulder: Secondary | ICD-10-CM | POA: Diagnosis not present

## 2020-03-08 DIAGNOSIS — M25532 Pain in left wrist: Secondary | ICD-10-CM | POA: Diagnosis not present

## 2020-03-08 DIAGNOSIS — M545 Low back pain, unspecified: Secondary | ICD-10-CM | POA: Diagnosis not present

## 2020-03-08 DIAGNOSIS — M25531 Pain in right wrist: Secondary | ICD-10-CM | POA: Diagnosis not present

## 2020-03-14 ENCOUNTER — Ambulatory Visit (INDEPENDENT_AMBULATORY_CARE_PROVIDER_SITE_OTHER): Payer: Medicare HMO | Admitting: Family Medicine

## 2020-03-14 ENCOUNTER — Other Ambulatory Visit: Payer: Self-pay

## 2020-03-14 ENCOUNTER — Encounter: Payer: Self-pay | Admitting: Family Medicine

## 2020-03-14 DIAGNOSIS — M79605 Pain in left leg: Secondary | ICD-10-CM | POA: Diagnosis not present

## 2020-03-14 DIAGNOSIS — M25561 Pain in right knee: Secondary | ICD-10-CM | POA: Diagnosis not present

## 2020-03-14 DIAGNOSIS — M79604 Pain in right leg: Secondary | ICD-10-CM | POA: Diagnosis not present

## 2020-03-14 DIAGNOSIS — M25562 Pain in left knee: Secondary | ICD-10-CM
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
## 2020-04-01 ENCOUNTER — Ambulatory Visit
Admission: RE | Admit: 2020-04-01 | Discharge: 2020-04-01 | Disposition: A | Discharge status: Home / Self Care | Payer: Medicare HMO | Source: Ambulatory Visit | Attending: Family Medicine | Admitting: Family Medicine

## 2020-04-01 ENCOUNTER — Other Ambulatory Visit: Payer: Self-pay

## 2020-04-01 ENCOUNTER — Telehealth: Payer: Self-pay | Admitting: Family Medicine

## 2020-04-01 DIAGNOSIS — M67462 Ganglion, left knee: Secondary | ICD-10-CM | POA: Diagnosis not present

## 2020-04-01 DIAGNOSIS — M1712 Unilateral primary osteoarthritis, left knee: Secondary | ICD-10-CM | POA: Diagnosis not present

## 2020-04-01 DIAGNOSIS — M1711 Unilateral primary osteoarthritis, right knee: Secondary | ICD-10-CM | POA: Diagnosis not present

## 2020-04-01 DIAGNOSIS — M25562 Pain in left knee: Secondary | ICD-10-CM

## 2020-04-01 DIAGNOSIS — M25561 Pain in right knee: Secondary | ICD-10-CM

## 2020-04-01 DIAGNOSIS — M7989 Other specified soft tissue disorders: Secondary | ICD-10-CM | POA: Diagnosis not present
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
## 2020-04-01 NOTE — Telephone Encounter (Signed)
Left knee MRI looks similar to the right.  Mainly arthritis in the joint.  Consider cortisone injection for pain.

## 2020-04-01 NOTE — Telephone Encounter (Signed)
Right knee MRI shows moderate arthritis behind the kneecap.  No meniscus cartilage or ligament tears. No indication for surgery at this point.  Could consider cortisone injection to settle down pain.  Left knee MRI is still pending.

## 2020-04-12 ENCOUNTER — Encounter: Payer: Self-pay | Admitting: Family Medicine

## 2020-04-12 ENCOUNTER — Ambulatory Visit (INDEPENDENT_AMBULATORY_CARE_PROVIDER_SITE_OTHER): Payer: Medicare HMO | Admitting: Family Medicine

## 2020-04-12 ENCOUNTER — Other Ambulatory Visit: Payer: Self-pay

## 2020-04-12 DIAGNOSIS — M25562 Pain in left knee: Secondary | ICD-10-CM

## 2020-04-12 DIAGNOSIS — M25561 Pain in right knee: Secondary | ICD-10-CM | POA: Diagnosis not present
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
## 2020-04-20 DIAGNOSIS — M25531 Pain in right wrist: Secondary | ICD-10-CM | POA: Diagnosis not present

## 2020-04-20 DIAGNOSIS — M25512 Pain in left shoulder: Secondary | ICD-10-CM | POA: Diagnosis not present

## 2020-04-20 DIAGNOSIS — M25532 Pain in left wrist: Secondary | ICD-10-CM | POA: Diagnosis not present

## 2020-04-20 DIAGNOSIS — M545 Low back pain, unspecified: Secondary | ICD-10-CM | POA: Diagnosis not present

## 2020-05-03 DIAGNOSIS — M545 Low back pain, unspecified: Secondary | ICD-10-CM | POA: Diagnosis not present

## 2020-05-03 DIAGNOSIS — M25532 Pain in left wrist: Secondary | ICD-10-CM | POA: Diagnosis not present

## 2020-05-03 DIAGNOSIS — M25512 Pain in left shoulder: Secondary | ICD-10-CM | POA: Diagnosis not present

## 2020-05-03 DIAGNOSIS — M25531 Pain in right wrist: Secondary | ICD-10-CM | POA: Diagnosis not present

## 2020-05-17 DIAGNOSIS — M25532 Pain in left wrist: Secondary | ICD-10-CM | POA: Diagnosis not present

## 2020-05-17 DIAGNOSIS — M25512 Pain in left shoulder: Secondary | ICD-10-CM | POA: Diagnosis not present

## 2020-05-17 DIAGNOSIS — M545 Low back pain, unspecified: Secondary | ICD-10-CM | POA: Diagnosis not present

## 2020-05-17 DIAGNOSIS — M25531 Pain in right wrist: Secondary | ICD-10-CM | POA: Diagnosis not present

## 2020-05-19 ENCOUNTER — Encounter: Payer: Self-pay | Admitting: Family Medicine

## 2020-07-04 ENCOUNTER — Other Ambulatory Visit: Payer: Self-pay

## 2020-07-04 ENCOUNTER — Ambulatory Visit: Payer: Self-pay | Admitting: Nurse Practitioner

## 2020-07-04 ENCOUNTER — Ambulatory Visit (INDEPENDENT_AMBULATORY_CARE_PROVIDER_SITE_OTHER): Payer: Medicare HMO | Admitting: Nurse Practitioner

## 2020-07-04 ENCOUNTER — Encounter: Payer: Self-pay | Admitting: Nurse Practitioner

## 2020-07-04 VITALS — BP 150/100 | HR 86 | Temp 98.7°F | Ht 65.0 in | Wt 176.2 lb

## 2020-07-04 DIAGNOSIS — K219 Gastro-esophageal reflux disease without esophagitis: Secondary | ICD-10-CM

## 2020-07-04 DIAGNOSIS — E782 Mixed hyperlipidemia: Secondary | ICD-10-CM | POA: Diagnosis not present

## 2020-07-04 DIAGNOSIS — L709 Acne, unspecified: Secondary | ICD-10-CM

## 2020-07-04 DIAGNOSIS — Z1231 Encounter for screening mammogram for malignant neoplasm of breast: Secondary | ICD-10-CM | POA: Diagnosis not present

## 2020-07-04 DIAGNOSIS — R030 Elevated blood-pressure reading, without diagnosis of hypertension: Secondary | ICD-10-CM | POA: Diagnosis not present

## 2020-07-04 DIAGNOSIS — E2839 Other primary ovarian failure: Secondary | ICD-10-CM | POA: Diagnosis not present

## 2020-07-04 DIAGNOSIS — K5730 Diverticulosis of large intestine without perforation or abscess without bleeding: Secondary | ICD-10-CM

## 2020-07-04 DIAGNOSIS — Z23 Encounter for immunization: Secondary | ICD-10-CM

## 2020-07-04 LAB — CBC WITH DIFFERENTIAL/PLATELET
Absolute Monocytes: 485 cells/uL (ref 200–950)
Basophils Absolute: 39 cells/uL (ref 0–200)
Basophils Relative: 0.5 %
Eosinophils Absolute: 100 cells/uL (ref 15–500)
Eosinophils Relative: 1.3 %
HCT: 42.3 % (ref 35.0–45.0)
Hemoglobin: 14.2 g/dL (ref 11.7–15.5)
Lymphs Abs: 3049 cells/uL (ref 850–3900)
MCH: 29.5 pg (ref 27.0–33.0)
MCHC: 33.6 g/dL (ref 32.0–36.0)
MCV: 87.9 fL (ref 80.0–100.0)
MPV: 9.1 fL (ref 7.5–12.5)
Monocytes Relative: 6.3 %
Neutro Abs: 4027 cells/uL (ref 1500–7800)
Neutrophils Relative %: 52.3 %
Platelets: 222 10*3/uL (ref 140–400)
RBC: 4.81 10*6/uL (ref 3.80–5.10)
RDW: 12.5 % (ref 11.0–15.0)
Total Lymphocyte: 39.6 %
WBC: 7.7 10*3/uL (ref 3.8–10.8)

## 2020-07-04 LAB — COMPLETE METABOLIC PANEL WITH GFR
AG Ratio: 1.6 (calc) (ref 1.0–2.5)
ALT: 25 U/L (ref 6–29)
AST: 18 U/L (ref 10–35)
Albumin: 4.6 g/dL (ref 3.6–5.1)
Alkaline phosphatase (APISO): 75 U/L (ref 37–153)
BUN: 19 mg/dL (ref 7–25)
CO2: 25 mmol/L (ref 20–32)
Calcium: 9.4 mg/dL (ref 8.6–10.4)
Chloride: 105 mmol/L (ref 98–110)
Creat: 0.78 mg/dL (ref 0.50–0.99)
GFR, Est African American: 92 mL/min/{1.73_m2} (ref >=60)
GFR, Est Non African American: 80 mL/min/{1.73_m2} (ref >=60)
Globulin: 2.9 g/dL (calc) (ref 1.9–3.7)
Glucose, Bld: 96 mg/dL (ref 65–139)
Potassium: 3.7 mmol/L (ref 3.5–5.3)
Sodium: 141 mmol/L (ref 135–146)
Total Bilirubin: 0.4 mg/dL (ref 0.2–1.2)
Total Protein: 7.5 g/dL (ref 6.1–8.1)

## 2020-07-04 LAB — LIPID PANEL
Cholesterol: 233 mg/dL — ABNORMAL HIGH (ref <200)
HDL: 54 mg/dL (ref >=50)
LDL Cholesterol (Calc): 155 mg/dL (calc) — ABNORMAL HIGH
Non-HDL Cholesterol (Calc): 179 mg/dL (calc) — ABNORMAL HIGH (ref <130)
Total CHOL/HDL Ratio: 4.3 (calc) (ref <5.0)
Triglycerides: 120 mg/dL (ref <150)

## 2020-07-04 NOTE — Patient Instructions (Addendum)
Schedule AWV- telephone visit   To check blood pressure reading at home- once you have been sitting and relaxed ~5 mins.  t
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
## 2020-07-06 ENCOUNTER — Other Ambulatory Visit: Payer: Self-pay

## 2020-07-06 DIAGNOSIS — E782 Mixed hyperlipidemia: Secondary | ICD-10-CM

## 2020-07-07 ENCOUNTER — Encounter: Payer: Self-pay | Admitting: Nurse Practitioner

## 2020-07-07 ENCOUNTER — Telehealth: Payer: Self-pay

## 2020-07-07 ENCOUNTER — Other Ambulatory Visit: Payer: Self-pay

## 2020-07-07 ENCOUNTER — Ambulatory Visit (INDEPENDENT_AMBULATORY_CARE_PROVIDER_SITE_OTHER): Payer: Medicare HMO | Admitting: Nurse Practitioner

## 2020-07-07 DIAGNOSIS — Z0000 Encounter for general adult medical examination without abnormal findings: Secondary | ICD-10-CM

## 2020-07-07 NOTE — Progress Notes (Signed)
Subjective:   Penny Gardner is a 65 y.o. female who presents for Medicare Annual (Subsequent) preventive examination.  Review of Systems    Blood pressure at home 116/76, 125/82      Objective:    There were no vitals filed for this visit. There is no height or weight on file to calculate BMI.  Advanced Directives 07/07/2020 07/04/2020  Does Patient Have a Medical Advance Directive? No No  Would patient like information on creating a medical advance directive? Yes (MAU/Ambulatory/Procedural Areas - Information given) Yes (MAU/Ambulatory/Procedural Areas - Information given)    Current Medications (verified) Outpatient Encounter Medications as of 07/07/2020  Medication Sig   acetaminophen (TYLENOL) 500 MG tablet Take 500 mg by mouth as needed.   Ibuprofen 200 MG CAPS Take 400 mg by mouth every 5 (five) hours as needed.   loratadine (CLARITIN) 10 MG tablet Take 10 mg by mouth daily as needed for allergies.   omeprazole (PRILOSEC) 20 MG capsule Take 1 capsule (20 mg total) by mouth daily.   polyethylene glycol (MIRALAX / GLYCOLAX) 17 g packet Take 17 g by mouth daily as needed.   Probiotic Product (PROBIOTIC ADVANCED) CAPS Take 1 capsule by mouth daily.   No facility-administered encounter medications on file as of 07/07/2020.    Allergies (verified) Cefaclor   History: Past Medical History:  Diagnosis Date   Benign neoplasm of colon    Conjunctival hemorrhage    Diverticulosis of colon (without mention of hemorrhage)    colonoscopy 8/02, 03/11/07 abnormal   Esophageal reflux    EGD positive 10/06   Hiatal hernia    History of cesarean section    1986 and 1988   History of ethmoidectomy 1989   Impacted cerumen    Papanicolaou smear of cervix with atypical squamous cells of undetermined significance (ASC-US)    colposcopy 3/07, ASCUS   Plantar fascial fibromatosis    Postmenopausal HRT (hormone replacement therapy)    Routine general medical examination at a health  care facility    Stricture and stenosis of esophagus    Past Surgical History:  Procedure Laterality Date   CATARACT EXTRACTION  2018   CESAREAN SECTION     1986 and 1988   ETHMOIDECTOMY  1989   GALLBLADDER SURGERY     Family History  Problem Relation Age of Onset   Cancer Mother        colon   Heart attack Mother 71   Suicidality Father    Diabetes Father    Cancer Maternal Uncle        colon   Stroke Maternal Grandmother    Diabetes Paternal Grandmother    Social History   Socioeconomic History   Marital status: Married    Spouse name: Not on file   Number of children: Not on file   Years of education: Not on file   Highest education level: Not on file  Occupational History   Not on file  Tobacco Use   Smoking status: Never   Smokeless tobacco: Never  Vaping Use   Vaping Use: Never used  Substance and Sexual Activity   Alcohol use: Yes    Alcohol/week: 5.0 standard drinks    Types: 5 Standard drinks or equivalent per week   Drug use: Never   Sexual activity: Yes  Other Topics Concern   Not on file  Social History Narrative   Diet:      Caffeine: yes      Married, if yes  what year: yes.1980      Do you live in a house, apartment, assisted living, condo, trailer, ect: house      Is it one or more stories: none      How many persons live in your home? 2      Pets: no      Highest level or education completed: Master of social work      IT sales professional profession: Educational psychologist      Exercise: yes                 Type and how often: treadmill and grow young fitness videos         Living Will: Yes   DNR: No   POA/HPOA: No      Functional Status:   Do you have difficulty bathing or dressing yourself? No   Do you have difficulty preparing food or eating? No   Do you have difficulty managing your medications? No   Do you have difficulty managing your finances? No   Do you have difficulty affording your medications? NO   0    Social Determinants of Radio broadcast assistant Strain: Not on file  Food Insecurity: Not on file  Transportation Needs: Not on file  Physical Activity: Not on file  Stress: Not on file  Social Connections: Not on file    Tobacco Counseling Counseling given: Not Answered   Clinical Intake:                 Diabetic?no         Activities of Daily Living No flowsheet data found.  Patient Care Team: [name], NP as PCP - General (Geriatric Medicine) Linton Rump, PT as Physical Therapist (Physical Therapy)  Indicate any recent Medical Services you may have received from other than [facility] providers in the past year (date may be approximate).     Assessment:   This is a routine wellness examination for Penny Gardner.  Hearing/Vision screen Hearing Screening - Comments:: Patient has no hearing problems Vision Screening - Comments:: Patient wears glasses.  Patient has not had eye exam in past 3 years due to Covid. Patient goes to [facility] eye care.  Dietary issues and exercise activities discussed:     Goals Addressed   None    Depression Screen PHQ 2/9 Scores 07/07/2020 04/08/2017  PHQ - 2 Score 0 0    Fall Risk Fall Risk  07/07/2020  Falls in the past year? 0  Number falls in past yr: 0  Injury with Fall? 0  Risk for fall due to : No Fall Risks  Follow up Falls evaluation completed    [place]:  Any stairs in or around the home? Yes  If so, are there any without handrails? No  Home free of loose throw rugs in walkways, pet beds, electrical cords, etc? Yes  Adequate lighting in your home to reduce risk of falls? Yes   ASSISTIVE DEVICES UTILIZED TO PREVENT FALLS:  Life alert? No  Use of a cane, walker or w/c? No  Grab bars in the bathroom? No  Shower chair or bench in shower? Yes  Elevated toilet seat or a handicapped toilet? No   TIMED UP AND GO:  Was the test performed? No .   Cognitive Function:      6CIT Screen 07/07/2020  What Year? 0 points  What month? 0 points  What time? 0 points  Count back from 20 0 points  Months in reverse 0 points  Repeat phrase 0 points  Total Score 0    Immunizations Immunization History  Administered Date(s) Administered   Influenza, Seasonal, Injecte, Preservative Fre 10/12/2015, 10/16/2017   Influenza,inj,Quad PF,6+ Mos 10/12/2014, 09/05/2016, 09/10/2018   Moderna Sars-Covid-2 Vaccination 02/19/2019, 03/20/2019, 12/04/2019   Pneumococcal Conjugate-13 07/04/2020   Tdap 08/02/2011    TDAP status: Up to date unsure of date   Flu Vaccine status: Up to date  Pneumococcal vaccine status: Due, Education has been provided regarding the importance of this vaccine. Advised may receive this vaccine at local pharmacy or Health Dept. Aware to provide a copy of the vaccination record if obtained from local pharmacy or Health Dept. Verbalized acceptance and understanding.  Covid-19 vaccine status: Completed vaccines  Qualifies for Shingles Vaccine? No   Zostavax completed No   Shingrix Completed?: No.    Education has been provided regarding the importance of this vaccine. Patient has been advised to call insurance company to determine out of pocket expense if they have not yet received this vaccine. Advised may also receive vaccine at local pharmacy or Health Dept. Verbalized acceptance and understanding.  Screening Tests Health Maintenance  Topic Date Due   HIV Screening  Never done   Zoster Vaccines- Shingrix (1 of 2) Never done   MAMMOGRAM  07/09/2019   DEXA SCAN  Never done   PAP SMEAR-Modifier  03/13/2020   COVID-19 Vaccine (4 - Booster for Moderna series) 04/02/2020   INFLUENZA VACCINE  08/08/2020   PNA vac Low Risk Adult (2 of 2 - PPSV23) 07/04/2021   TETANUS/TDAP  08/01/2021   COLONOSCOPY (Pts 45-66yrs Insurance coverage will need to be confirmed)  04/16/2027   Hepatitis C Screening  Completed   HPV VACCINES  Aged Out    Health  Maintenance  Health Maintenance Due  Topic Date Due   HIV Screening  Never done   Zoster Vaccines- Shingrix (1 of 2) Never done   MAMMOGRAM  07/09/2019   DEXA SCAN  Never done   PAP SMEAR-Modifier  03/13/2020   COVID-19 Vaccine (4 - Booster for Moderna series) 04/02/2020    Colorectal cancer screening: Type of screening: Colonoscopy. Completed 2019. Repeat every 10 years  Mammogram status: Ordered  . Pt provided with contact info and advised to call to schedule appt.   Bone Density status: Ordered  . Pt provided with contact info and advised to call to schedule appt.  Lung Cancer Screening: (Low Dose CT Chest recommended if Age 78-80 years, 30 pack-year currently smoking OR have quit w/in 15years.) does not qualify.   Lung Cancer Screening Referral:   Additional Screening:  Hepatitis C Screening: does qualify; Completed   Vision Screening: Recommended annual ophthalmology exams for early detection of glaucoma and other disorders of the eye. Is the patient up to date with their annual eye exam?  No  Who is the provider or what is the name of the office in which the patient attends annual eye exams? [facility] If pt is not established with a provider, would they like to be referred to a provider to establish care? No .   Dental Screening: Recommended annual dental exams for proper oral hygiene  Community Resource Referral / Chronic Care Management: CRR required this visit?  No   CCM required this visit?  No      Plan:     I have personally reviewed and noted the following in the patient's chart:  Medical and social history Use of alcohol, tobacco or illicit drugs  Current medications and supplements including opioid prescriptions.  Functional ability and status Nutritional status Physical activity Advanced directives List of other physicians Hospitalizations, surgeries, and ER visits in previous 12 months Vitals Screenings to include cognitive, depression, and  falls Referrals and appointments  In addition, I have reviewed and discussed with patient certain preventive protocols, quality metrics, and best practice recommendations. A written personalized care plan for preventive services as well as general preventive health recommendations were provided to patient.     [name], NP   07/07/2020     Virtual Visit via Telephone Note  I connected withNAME@ on 07/07/20 at  9:30 AM EDT by telephone and verified that I am speaking with the correct person using two identifiers.  Location: Patient: home Provider: [facility]   I discussed the limitations, risks, security and privacy concerns of performing an evaluation and management service by telephone and the availability of in person appointments. I also discussed with the patient that there may be a patient responsible charge related to this service. The patient expressed understanding and agreed to proceed.   I discussed the assessment and treatment plan with the patient. The patient was provided an opportunity to ask questions and all were answered. The patient agreed with the plan and demonstrated an understanding of the instructions.   The patient was advised to call back or seek an in-person evaluation if the symptoms worsen or if the condition fails to improve as anticipated.  I provided 15 minutes of non-face-to-face time during this encounter.  Carlos American. Harle Battiest Avs printed and mailed

## 2020-07-07 NOTE — Progress Notes (Signed)
This service is provided via telemedicine  No vital signs collected/recorded due to the encounter was a telemedicine visit.   Location of patient (ex: home, work):  Home  Patient consents to a telephone visit:  Yes, see encounter dated 07/07/2020  Location of the provider (ex: office, home):  [place]  Name of any referring provider:  N/A  Names of all persons participating in the telemedicine service and their role in the encounter:  Sherrie Mustache, Nurse Practitioner, Carroll Kinds, CMA, and patient.   Time spent on call:  10 minutes with medical assistant

## 2020-07-07 NOTE — Telephone Encounter (Signed)
[name], dino are scheduled for a virtual visit with your provider today.    Just as we do with appointments in the office, we must obtain your consent to participate.  Your consent will be active for this visit and any virtual visit you may have with one of our providers in the next 365 days.    If you have a MyChart account, I can also send a copy of this consent to you electronically.  All virtual visits are billed to your insurance company just like a traditional visit in the office.  As this is a virtual visit, video technology does not allow for your provider to perform a traditional examination.  This may limit your provider's ability to fully assess your condition.  If your provider identifies any concerns that need to be evaluated in person or the need to arrange testing such as labs, EKG, etc, we will make arrangements to do so.    Although advances in technology are sophisticated, we cannot ensure that it will always work on either your end or our end.  If the connection with a video visit is poor, we may have to switch to a telephone visit.  With either a video or telephone visit, we are not always able to ensure that we have a secure connection.   I need to obtain your verbal consent now.   Are you willing to proceed with your visit today?   Penny Gardner has provided verbal consent on 07/07/2020 for a virtual visit (video or telephone).   Carroll Kinds, CMA 07/07/2020  9:42 AM

## 2020-07-07 NOTE — Patient Instructions (Signed)
Ms. Gillingham , Thank you for taking time to come for your Medicare Wellness Visit. I appreciate your ongoing commitment to your health goals. Please review the following plan we discussed and let me know if I can assist you in the future.   Screening recommendations/referrals: Colonoscopy up to date Mammogram recommended- to make appt Bone Density recommended to make appt Recommended yearly ophthalmology/optometry visit for glaucoma screening and checkup Recommended yearly dental visit for hygiene and checkup  Vaccinations: Influenza vaccine up to date Pneumococcal vaccine  Tdap vaccine please get record so we can add to file.  Shingles vaccine recommended to get at local pharmacy     Advanced directives: recommend to complete and place on file.   Conditions/risks identified: elevated cholesterol.   Next appointment: 1 year.    Preventive Care 30 Years and Older, Female Preventive care refers to lifestyle choices and visits with your health care provider that can promote health and wellness. What does preventive care include? A yearly physical exam. This is also called an annual well check. Dental exams once or twice a year. Routine eye exams. Ask your health care provider how often you should have your eyes checked. Personal lifestyle choices, including: Daily care of your teeth and gums. Regular physical activity. Eating a healthy diet. Avoiding tobacco and drug use. Limiting alcohol use. Practicing safe sex. Taking low-dose aspirin every day. Taking vitamin and mineral supplements as recommended by your health care provider. What happens during an annual well check? The services and screenings done by your health care provider during your annual well check will depend on your age, overall health, lifestyle risk factors, and family history of disease. Counseling  Your health care provider may ask you questions about your: Alcohol use. Tobacco use. Drug use. Emotional  well-being. Home and relationship well-being. Sexual activity. Eating habits. History of falls. Memory and ability to understand (cognition). Work and work Statistician. Reproductive health. Screening  You may have the following tests or measurements: Height, weight, and BMI. Blood pressure. Lipid and cholesterol levels. These may be checked every 5 years, or more frequently if you are over 77 years old. Skin check. Lung cancer screening. You may have this screening every year starting at age 22 if you have a 30-pack-year history of smoking and currently smoke or have quit within the past 15 years. Fecal occult blood test (FOBT) of the stool. You may have this test every year starting at age 85. Flexible sigmoidoscopy or colonoscopy. You may have a sigmoidoscopy every 5 years or a colonoscopy every 10 years starting at age 84. Hepatitis C blood test. Hepatitis B blood test. Sexually transmitted disease (STD) testing. Diabetes screening. This is done by checking your blood sugar (glucose) after you have not eaten for a while (fasting). You may have this done every 1-3 years. Bone density scan. This is done to screen for osteoporosis. You may have this done starting at age 39. Mammogram. This may be done every 1-2 years. Talk to your health care provider about how often you should have regular mammograms. Talk with your health care provider about your test results, treatment options, and if necessary, the need for more tests. Vaccines  Your health care provider may recommend certain vaccines, such as: Influenza vaccine. This is recommended every year. Tetanus, diphtheria, and acellular pertussis (Tdap, Td) vaccine. You may need a Td booster every 10 years. Zoster vaccine. You may need this after age 77. Pneumococcal 13-valent conjugate (PCV13) vaccine. One dose is recommended after  age 35. Pneumococcal polysaccharide (PPSV23) vaccine. One dose is recommended after age 26. Talk to your  health care provider about which screenings and vaccines you need and how often you need them. This information is not intended to replace advice given to you by your health care provider. Make sure you discuss any questions you have with your health care provider. Document Released: 01/21/2015 Document Revised: 09/14/2015 Document Reviewed: 10/26/2014 Elsevier Interactive Patient Education  2017 [place] Prevention in the Home Falls can cause injuries. They can happen to people of all ages. There are many things you can do to make your home safe and to help prevent falls. What can I do on the outside of my home? Regularly fix the edges of walkways and driveways and fix any cracks. Remove anything that might make you trip as you walk through a door, such as a raised step or threshold. Trim any bushes or trees on the path to your home. Use bright outdoor lighting. Clear any walking paths of anything that might make someone trip, such as rocks or tools. Regularly check to see if handrails are loose or broken. Make sure that both sides of any steps have handrails. Any raised decks and porches should have guardrails on the edges. Have any leaves, snow, or ice cleared regularly. Use sand or salt on walking paths during winter. Clean up any spills in your garage right away. This includes oil or grease spills. What can I do in the bathroom? Use night lights. Install grab bars by the toilet and in the tub and shower. Do not use towel bars as grab bars. Use non-skid mats or decals in the tub or shower. If you need to sit down in the shower, use a plastic, non-slip stool. Keep the floor dry. Clean up any water that spills on the floor as soon as it happens. Remove soap buildup in the tub or shower regularly. Attach bath mats securely with double-sided non-slip rug tape. Do not have throw rugs and other things on the floor that can make you trip. What can I do in the bedroom? Use night  lights. Make sure that you have a light by your bed that is easy to reach. Do not use any sheets or blankets that are too big for your bed. They should not hang down onto the floor. Have a firm chair that has side arms. You can use this for support while you get dressed. Do not have throw rugs and other things on the floor that can make you trip. What can I do in the kitchen? Clean up any spills right away. Avoid walking on wet floors. Keep items that you use a lot in easy-to-reach places. If you need to reach something above you, use a strong step stool that has a grab bar. Keep electrical cords out of the way. Do not use floor polish or wax that makes floors slippery. If you must use wax, use non-skid floor wax. Do not have throw rugs and other things on the floor that can make you trip. What can I do with my stairs? Do not leave any items on the stairs. Make sure that there are handrails on both sides of the stairs and use them. Fix handrails that are broken or loose. Make sure that handrails are as long as the stairways. Check any carpeting to make sure that it is firmly attached to the stairs. Fix any carpet that is loose or worn. Avoid having throw rugs  at the top or bottom of the stairs. If you do have throw rugs, attach them to the floor with carpet tape. Make sure that you have a light switch at the top of the stairs and the bottom of the stairs. If you do not have them, ask someone to add them for you. What else can I do to help prevent falls? Wear shoes that: Do not have high heels. Have rubber bottoms. Are comfortable and fit you well. Are closed at the toe. Do not wear sandals. If you use a stepladder: Make sure that it is fully opened. Do not climb a closed stepladder. Make sure that both sides of the stepladder are locked into place. Ask someone to hold it for you, if possible. Clearly mark and make sure that you can see: Any grab bars or handrails. First and last  steps. Where the edge of each step is. Use tools that help you move around (mobility aids) if they are needed. These include: Canes. Walkers. Scooters. Crutches. Turn on the lights when you go into a dark area. Replace any light bulbs as soon as they burn out. Set up your furniture so you have a clear path. Avoid moving your furniture around. If any of your floors are uneven, fix them. If there are any pets around you, be aware of where they are. Review your medicines with your doctor. Some medicines can make you feel dizzy. This can increase your chance of falling. Ask your doctor what other things that you can do to help prevent falls. This information is not intended to replace advice given to you by your health care provider. Make sure you discuss any questions you have with your health care provider. Document Released: 10/21/2008 Document Revised: 06/02/2015 Document Reviewed: 01/29/2014 Elsevier Interactive Patient Education  2017 [employer].

## 2020-07-14 ENCOUNTER — Other Ambulatory Visit: Payer: Self-pay

## 2020-07-14 ENCOUNTER — Telehealth (INDEPENDENT_AMBULATORY_CARE_PROVIDER_SITE_OTHER): Payer: Medicare HMO | Admitting: Nurse Practitioner

## 2020-07-14 DIAGNOSIS — W57XXXA Bitten or stung by nonvenomous insect and other nonvenomous arthropods, initial encounter: Secondary | ICD-10-CM

## 2020-07-14 DIAGNOSIS — L03312 Cellulitis of back [any part except buttock]: Secondary | ICD-10-CM | POA: Diagnosis not present

## 2020-07-14 DIAGNOSIS — S30860A Insect bite (nonvenomous) of lower back and pelvis, initial encounter: Secondary | ICD-10-CM | POA: Diagnosis not present

## 2020-07-14 MED ORDER — DOXYCYCLINE HYCLATE 100 MG PO TABS
100.0000 mg | ORAL_TABLET | Freq: Two times a day (BID) | ORAL | 0 refills | Status: DC
Start: 2020-07-14 — End: 2020-07-22

## 2020-07-14 NOTE — Progress Notes (Signed)
This service is provided via telemedicine  No vital signs collected/recorded due to the encounter was a telemedicine visit.   Location of patient (ex: home, work):  Home  Patient consents to a telephone visit:  Yes, see encounter dated 07/07/2020  Location of the provider (ex: office, home):  [place]  Name of any referring provider:  N/A  Names of all persons participating in the telemedicine service and their role in the encounter:  Sherrie Mustache, Nurse Practitioner, Carroll Kinds, CMA, patient and Husband  Time spent on call:  8 minutes with medical assistant

## 2020-07-14 NOTE — Progress Notes (Signed)
Careteam: Patient Care Team: [name], NP as PCP - General (Geriatric Medicine) Linton Rump, PT as Physical Therapist (Physical Therapy)  Advanced Directive information    Allergies  Allergen Reactions   Cefaclor     REACTION: itching    Chief Complaint  Patient presents with   Acute Visit    Patient complains of flu like symptoms after tick bite. Patient was bitten by tick 3 weeks ago. Patient hasn't been feeling well since Pneumonia vaccine. Patient having gut spsams but no vomiting. Patient had fever of 100. Patient has terrible headache. Body aches. Took home COVID test and it was negative. Patient took Tylenol. Patient unsure how long she's had symptoms.Fatigued     HPI: Patient is a 65 y.o. female due to not feeling well.  She has not feeling well since pneumonia vaccine.  She also has been doing a lot of physical activity. However yesterday went to bed at 8 pm and then woke up at 2 am and has felt bad since.  She aches all over, fingers hunt Headache, chills, myalagias.  Temp 100.  Took tylenol this morning which has helped.  Has chronic congestion- unchanged.  Never had headaches but has now.  No cough, chest congestion.  No nausea, vomiting or diarrhea.  No wounds.  No dysuria.  No rashes.  COVID negative.  Some slight inflammation around where the tick bite was, flat, itches, non-tender.   Review of Systems:  Review of Systems  Constitutional:  Positive for chills, fever and malaise/fatigue. Negative for weight loss.  HENT:  Negative for tinnitus.   Respiratory:  Negative for cough, sputum production and shortness of breath.   Cardiovascular:  Negative for chest pain, palpitations and leg swelling.  Gastrointestinal:  Negative for abdominal pain, constipation, diarrhea and heartburn.  Genitourinary:  Negative for dysuria, frequency and urgency.  Musculoskeletal:  Positive for myalgias. Negative for back pain, falls and joint pain.  Skin:   Positive for itching. Negative for rash.  Neurological:  Negative for dizziness and headaches.  Psychiatric/Behavioral:  Negative for depression and memory loss. The patient does not have insomnia.    Past Medical History:  Diagnosis Date   Benign neoplasm of colon    Conjunctival hemorrhage    Diverticulosis of colon (without mention of hemorrhage)    colonoscopy 8/02, 03/11/07 abnormal   Esophageal reflux    EGD positive 10/06   Hiatal hernia    History of cesarean section    1986 and 1988   History of ethmoidectomy 1989   Impacted cerumen    Papanicolaou smear of cervix with atypical squamous cells of undetermined significance (ASC-US)    colposcopy 3/07, ASCUS   Plantar fascial fibromatosis    Postmenopausal HRT (hormone replacement therapy)    Routine general medical examination at a health care facility    Stricture and stenosis of esophagus    Past Surgical History:  Procedure Laterality Date   CATARACT EXTRACTION  2018   [place] History:   reports that she has never smoked. She has never used smokeless tobacco. She reports current alcohol use of about 5.0 standard drinks of alcohol per week. She reports that she does not use drugs.  Family History  Problem Relation Age of Onset   Cancer Mother        colon   Heart attack Mother 59  Suicidality Father    Diabetes Father    Cancer Maternal Uncle        colon   Stroke Maternal Grandmother    Diabetes Paternal Grandmother     Medications: Patient's Medications  New Prescriptions   No medications on file  Previous Medications   ACETAMINOPHEN (TYLENOL) 500 MG TABLET    Take 500 mg by mouth as needed.   IBUPROFEN 200 MG CAPS    Take 400 mg by mouth every 5 (five) hours as needed.   LORATADINE (CLARITIN) 10 MG TABLET    Take 10 mg by mouth daily as needed for allergies.   OMEPRAZOLE (PRILOSEC) 20 MG CAPSULE    Take 1 capsule (20 mg  total) by mouth daily.   POLYETHYLENE GLYCOL (MIRALAX / GLYCOLAX) 17 G PACKET    Take 17 g by mouth daily as needed.   PROBIOTIC PRODUCT (PROBIOTIC ADVANCED) CAPS    Take 1 capsule by mouth daily.  Modified Medications   No medications on file  Discontinued Medications   No medications on file    Physical Exam:  There were no vitals filed for this visit. There is no height or weight on file to calculate BMI. Wt Readings from Last 3 Encounters:  07/04/20 176 lb 3.2 oz (79.9 kg)  02/01/20 175 lb (79.4 kg)  01/28/20 175 lb (79.4 kg)    Physical Exam  Labs reviewed: Basic Metabolic Panel: Recent Labs    07/04/20 1422  NA 141  K 3.7  CL 105  CO2 25  GLUCOSE 96  BUN 19  CREATININE 0.78  CALCIUM 9.4   Liver Function Tests: Recent Labs    07/04/20 1422  AST 18  ALT 25  BILITOT 0.4  PROT 7.5   No results for input(s): LIPASE, AMYLASE in the last 8760 hours. No results for input(s): AMMONIA in the last 8760 hours. CBC: Recent Labs    07/04/20 1422  WBC 7.7  NEUTROABS 4,027  HGB 14.2  HCT 42.3  MCV 87.9  PLT 222   Lipid Panel: Recent Labs    07/04/20 1422  CHOL 233*  HDL 54  LDLCALC 155*  TRIG 120  CHOLHDL 4.3   TSH: No results for input(s): TSH in the last 8760 hours. A1C: No results found for: HGBA1C   Assessment/Plan 1. Tick bite of lower back, initial encounter -now with low grade fever, fatigue and malaise after tick bite.  -she is at home and does not wish to come into office for further testing, consider lymes disease antibodies.  - doxycycline (VIBRA-TABS) 100 MG tablet; Take 1 tablet (100 mg total) by mouth 2 (two) times daily.  Dispense: 14 tablet; Refill: 0  2. Cellulitis of back except buttock -appears to be early stage cellulitis. To closely monitor area for worsening redness, tenderness or pain. - doxycycline (VIBRA-TABS) 100 MG tablet; Take 1 tablet (100 mg total) by mouth 2 (two) times daily.  Dispense: 14 tablet; Refill:  0  Strict return precautions discussed. Carlos American. Harle Battiest  [facility] [phone]    Virtual Visit via Harlon Flor  I connected with patient on 07/14/20 at 11:00 AM EDT by video and verified that I am speaking with the correct person using two identifiers.  Location: Patient: home Provider: [facility]   I discussed the limitations, risks, security and privacy concerns of performing an evaluation and management service by telephone and the availability of in person appointments. I also discussed with the patient that  there may be a patient responsible charge related to this service. The patient expressed understanding and agreed to proceed.   I discussed the assessment and treatment plan with the patient. The patient was provided an opportunity to ask questions and all were answered. The patient agreed with the plan and demonstrated an understanding of the instructions.   The patient was advised to call back or seek an in-person evaluation if the symptoms worsen or if the condition fails to improve as anticipated.  I provided 20 minutes of non-face-to-face time during this encounter.  Carlos American. Harle Battiest Avs printed and mailed

## 2020-07-18 ENCOUNTER — Encounter: Payer: Self-pay | Admitting: Nurse Practitioner

## 2020-07-18 NOTE — Telephone Encounter (Signed)
Referral sent to Dr. Druscilla Brownie [address] [place]

## 2020-07-22 ENCOUNTER — Ambulatory Visit (INDEPENDENT_AMBULATORY_CARE_PROVIDER_SITE_OTHER): Payer: Medicare HMO | Admitting: Family

## 2020-07-22 ENCOUNTER — Encounter: Payer: Self-pay | Admitting: Family

## 2020-07-22 ENCOUNTER — Other Ambulatory Visit: Payer: Self-pay

## 2020-07-22 VITALS — BP 120/82 | HR 97 | Temp 98.2°F | Resp 16 | Ht 65.0 in | Wt 175.5 lb

## 2020-07-22 DIAGNOSIS — W57XXXD Bitten or stung by nonvenomous insect and other nonvenomous arthropods, subsequent encounter: Secondary | ICD-10-CM

## 2020-07-22 DIAGNOSIS — R52 Pain, unspecified: Secondary | ICD-10-CM

## 2020-07-22 DIAGNOSIS — S30860D Insect bite (nonvenomous) of lower back and pelvis, subsequent encounter: Secondary | ICD-10-CM | POA: Diagnosis not present

## 2020-07-22 MED ORDER — DOXYCYCLINE HYCLATE 100 MG PO TABS: 100.0000 mg | ORAL_TABLET | Freq: Two times a day (BID) | ORAL | 0 refills | Status: DC

## 2020-07-22 NOTE — Progress Notes (Signed)
Provider: Hale Chalfin FNP-C  [name], NP  Patient Care Team: [name], NP as PCP - General (Geriatric Medicine) Linton Rump, PT as Physical Therapist (Physical Therapy)  Extended Emergency Contact Information Primary Emergency Contact: Kennieth Francois Address: [address]  [place], [place] [address] Johnnette Litter of [place] Phone: [phone] Mobile Phone: [phone] Relation: Spouse Secondary Emergency Contact: Cipriana, [place] Mobile Phone: [phone] Relation: Son  Code Status:  Full Code Goals of care: Advanced Directive information Advanced Directives 07/22/2020  Does Patient Have a Medical Advance Directive? No  Would patient like information on creating a medical advance directive? Yes (MAU/Ambulatory/Procedural Areas - Information given)     Chief Complaint  Patient presents with   Acute Visit    Complains of aching all over and fever.     HPI:  Pt is a 65 y.o. female seen today for an acute visit for evaluation of achiness all over the body and fever.After 24 hrs of completing antibiotics. she felt better while she was on antibiotics.also having pain in her bones.she denies any tiredness swollen or tender lymph glands.states previous tick bite site on left lower back has healed just itchy. She Had a virtual visit 07/14/2020 with PCP here for tike bite was treated with 7 days course doxycycline.completed one day ago.    Past Medical History:  Diagnosis Date   Benign neoplasm of colon    Conjunctival hemorrhage    Diverticulosis of colon (without mention of hemorrhage)    colonoscopy 8/02, 03/11/07 abnormal   Esophageal reflux    EGD positive 10/06   Hiatal hernia    History of cesarean section    1986 and 1988   History of ethmoidectomy 1989   Impacted cerumen    Papanicolaou smear of cervix with atypical squamous cells of undetermined significance (ASC-US)    colposcopy 3/07, ASCUS   Plantar fascial  fibromatosis    Postmenopausal HRT (hormone replacement therapy)    Routine general medical examination at a health care facility    Stricture and stenosis of esophagus    Past Surgical History:  Procedure Laterality Date   CATARACT EXTRACTION  2018   [place] and 1988   ETHMOIDECTOMY  1989   GALLBLADDER SURGERY      Allergies  Allergen Reactions   Cefaclor     REACTION: itching    Outpatient Encounter Medications as of 07/22/2020  Medication Sig   acetaminophen (TYLENOL) 500 MG tablet Take 500 mg by mouth as needed.   Ibuprofen 200 MG CAPS Take 400 mg by mouth every 5 (five) hours as needed.   loratadine (CLARITIN) 10 MG tablet Take 10 mg by mouth daily as needed for allergies.   omeprazole (PRILOSEC) 20 MG capsule Take 1 capsule (20 mg total) by mouth daily.   polyethylene glycol (MIRALAX / GLYCOLAX) 17 g packet Take 17 g by mouth daily as needed.   Probiotic Product (PROBIOTIC ADVANCED) CAPS Take 1 capsule by mouth daily.   [DISCONTINUED] doxycycline (VIBRA-TABS) 100 MG tablet Take 1 tablet (100 mg total) by mouth 2 (two) times daily.   No facility-administered encounter medications on file as of 07/22/2020.    Review of Systems  Constitutional:  Positive for fever. Negative for appetite change, chills, fatigue and unexpected weight change.       Generalized body and bone aches   HENT:  Negative for congestion, dental problem, ear discharge,  ear pain, facial swelling, hearing loss, nosebleeds, postnasal drip, rhinorrhea, sinus pressure, sinus pain, sneezing, sore throat, tinnitus and trouble swallowing.   Eyes:  Negative for pain, discharge, redness, itching and visual disturbance.  Respiratory:  Negative for cough, chest tightness, shortness of breath and wheezing.   Cardiovascular:  Negative for chest pain, palpitations and leg swelling.  Gastrointestinal:  Negative for abdominal distention, abdominal pain, blood in stool, constipation, diarrhea, nausea and  vomiting.  Endocrine: Negative for cold intolerance, heat intolerance, polydipsia, polyphagia and polyuria.  Genitourinary:  Negative for difficulty urinating, dysuria, flank pain, frequency and urgency.  Musculoskeletal:  Negative for arthralgias, back pain, gait problem, joint swelling, myalgias, neck pain and neck stiffness.  Skin:  Negative for color change, pallor, rash and wound.  Neurological:  Negative for dizziness, syncope, speech difficulty, weakness, light-headedness, numbness and headaches.  Hematological:  Does not bruise/bleed easily.  Psychiatric/Behavioral:  Negative for agitation, behavioral problems, confusion, hallucinations and sleep disturbance. The patient is not nervous/anxious.   All other systems reviewed and are negative.  Immunization History  Administered Date(s) Administered   Influenza, Seasonal, Injecte, Preservative Fre 10/12/2015, 10/16/2017   Influenza,inj,Quad PF,6+ Mos 10/12/2014, 09/05/2016, 09/10/2018   Moderna Sars-Covid-2 Vaccination 02/19/2019, 03/20/2019, 12/04/2019   Pneumococcal Conjugate-13 07/04/2020   Tdap 08/02/2011   Pertinent  Health Maintenance Due  Topic Date Due   MAMMOGRAM  07/09/2019   DEXA SCAN  Never done   PAP SMEAR-Modifier  03/13/2020   INFLUENZA VACCINE  08/08/2020   PNA vac Low Risk Adult (2 of 2 - PPSV23) 07/04/2021   COLONOSCOPY (Pts 45-41yrs Insurance coverage will need to be confirmed)  04/16/2027   Fall Risk  07/22/2020 07/07/2020  Falls in the past year? 0 0  Number falls in past yr: 0 0  Injury with Fall? 0 0  Risk for fall due to : No Fall Risks No Fall Risks  Follow up Falls evaluation completed Falls evaluation completed   Functional Status Survey:    Vitals:   07/22/20 1335  BP: 120/82  Pulse: 97  Resp: 16  Temp: 98.2 F (36.8 C)  SpO2: 98%  Weight: 175 lb 8 oz (79.6 kg)  Height: $Remove'5\' 5"'ZFDpjMv$  (1.651 m)   Body mass index is 29.2 kg/m. Physical Exam Vitals reviewed.  Constitutional:      General: She  is not in acute distress.    Appearance: Normal appearance. She is normal weight. She is not ill-appearing or diaphoretic.  HENT:     Head: Normocephalic.     Right Ear: Tympanic membrane, ear canal and external ear normal. There is no impacted cerumen.     Left Ear: Tympanic membrane, ear canal and external ear normal. There is no impacted cerumen.     Nose: Nose normal. No congestion or rhinorrhea.     Mouth/Throat:     Mouth: Mucous membranes are moist.     Pharynx: Oropharynx is clear. No oropharyngeal exudate or posterior oropharyngeal erythema.  Eyes:     General: No scleral icterus.       Right eye: No discharge.        Left eye: No discharge.     Extraocular Movements: Extraocular movements intact.     Conjunctiva/sclera: Conjunctivae normal.     Pupils: Pupils are equal, round, and reactive to light.  Neck:     Vascular: No carotid bruit.  Cardiovascular:     Rate and Rhythm: Normal rate and regular rhythm.     Pulses: Normal pulses.  Heart sounds: Normal heart sounds. No murmur heard.   No friction rub. No gallop.  Pulmonary:     Effort: Pulmonary effort is normal. No respiratory distress.     Breath sounds: Normal breath sounds. No wheezing, rhonchi or rales.  Chest:     Chest wall: No tenderness.  Abdominal:     General: Bowel sounds are normal. There is no distension.     Palpations: Abdomen is soft. There is no mass.     Tenderness: There is no abdominal tenderness. There is no right CVA tenderness, left CVA tenderness, guarding or rebound.  Musculoskeletal:        General: No swelling or tenderness. Normal range of motion.     Cervical back: Normal range of motion. No rigidity or tenderness.     Right lower leg: No edema.     Left lower leg: No edema.  Lymphadenopathy:     Cervical: No cervical adenopathy.  Skin:    General: Skin is warm and dry.     Coloration: Skin is not pale.     Findings: No bruising, erythema, lesion or rash.     Comments: Left  lower back previous tick bite site healed without any rash or redness.Non-tender to palpation.   Neurological:     Mental Status: She is alert and oriented to person, place, and time.     Cranial Nerves: No cranial nerve deficit.     Sensory: No sensory deficit.     Motor: No weakness.     Coordination: Coordination normal.     Gait: Gait normal.  Psychiatric:        Mood and Affect: Mood normal.        Speech: Speech normal.        Behavior: Behavior normal.        Thought Content: Thought content normal.        Judgment: Judgment normal.    Labs reviewed: Recent Labs    07/04/20 1422  NA 141  K 3.7  CL 105  CO2 25  GLUCOSE 96  BUN 19  CREATININE 0.78  CALCIUM 9.4   Recent Labs    07/04/20 1422  AST 18  ALT 25  BILITOT 0.4  PROT 7.5   Recent Labs    07/04/20 1422  WBC 7.7  NEUTROABS 4,027  HGB 14.2  HCT 42.3  MCV 87.9  PLT 222   Lab Results  Component Value Date   TSH 1.77 07/30/2013   No results found for: HGBA1C Lab Results  Component Value Date   CHOL 233 (H) 07/04/2020   HDL 54 07/04/2020   LDLCALC 155 (H) 07/04/2020   LDLDIRECT 153.2 08/02/2011   TRIG 120 07/04/2020   CHOLHDL 4.3 07/04/2020    Significant Diagnostic Results in last 30 days:  No results found.  Assessment/Plan 1. Generalized body aches S/p tick bite post 7 days course of antibiotics. Will rule out other acute abnormalities and lyme disease. - advised to take OTC tylenol for body aches and fever - will treat with another round of doxycycline x 7 days total of 14 days.  - CBC with Differential/Platelet - CMP with eGFR([facility]) - Lyme Disease Abs IgG, IgM, IFA, SF - Advised to notify provider or go to ED if symptoms worsen or fail to improve   2. Tick bite of lower back, subsequent encounter Left lower back previous tick bite site healed without any rash or redness.Non-tender to palpation.  - CBC with Differential/Platelet - CMP with  eGFR([facility]) - Lyme Disease Abs IgG,  IgM, IFA, CSF - doxycycline (VIBRA-TABS) 100 MG tablet; Take 1 tablet (100 mg total) by mouth 2 (two) times daily.  Dispense: 14 tablet; Refill: 0    Family/ staff Communication: Reviewed plan of care with patient verbalized understanding   Labs/tests ordered:  - CBC with Differential/Platelet - CMP with eGFR([facility]) - Lyme Disease Abs IgG, IgM, IFA, SF  Next Appointment: As needed if symptoms worsen or fail to improve    Sandrea Hughs, NP

## 2020-07-22 NOTE — Patient Instructions (Signed)
Tick Bite Information, Adult Ticks are insects that draw blood for food. Most ticks live in shrubs and grassy and wooded areas. They climb onto people and animals that brush against the leaves and grasses that they rest on. Then they bite, attaching themselves to the skin. Most ticks are harmless, but some ticks may carry germs that can spread to a person through a bite and cause a disease. To reduce your risk of getting a disease from a tick bite, make sure you: Take steps to prevent tick bites. Check for ticks after being outdoors where ticks live. Watch for symptoms of disease if a tick attached to you or if you suspect a tick bite. How can I prevent tick bites? Take these steps to help prevent tick bites when you go outdoors in an areawhere ticks live: Use insect repellent Use insect repellent that has DEET (20% or higher), picaridin, or IR3535 in it. Follow the instructions on the label. Use these products on: Bare skin. The top of your boots. Your pant legs. Your sleeve cuffs. For insect repellent that contains permethrin, follow the instructions on the label. Use these products on: Clothing. Boots. Outdoor gear. Tents. When you are outside Wear protective clothing. Long sleeves and long pants offer the best protection from ticks. Wear light-colored clothing so you can see ticks more easily. Tuck your pant legs into your socks. If you go walking on a trail, stay in the middle of the trail so your skin, hair, and clothing do not touch the bushes. Avoid walking through areas with long grass. Check for ticks on your clothing, hair, and skin often while you are outside, and check again before you go inside. Make sure to check the scalp, neck, armpits, waist, groin, and joint areas. These are the spots where ticks attach themselves most often. When you go indoors Check your clothing for ticks. Tumble dry clothes in a dryer on high heat for at least 10 minutes. If clothes are damp,  additional time may be needed. If clothes require washing, use hot water. Examine gear and pets. Shower soon after being outdoors. Check your body for ticks. Conduct a full body check using a mirror. What is the proper way to remove a tick? If you find a tick on your body, remove it as soon as possible. Removing a tick sooner can prevent germs from passing to your body. Do not remove the tick with your bare fingers. To remove a tick that is crawling on your skin but has not bitten, use either of these methods: Go outdoors and brush the tick off. Remove the tick with tape or a lint roller. To remove a tick that is attached to your skin: Wash your hands. If you have latex gloves, put them on. Use fine-tipped tweezers, curved forceps, or a tick-removal tool to gently grasp the tick as close to your skin and the tick's head as possible. Gently pull with a steady, upward, even pressure until the tick lets go. When removing the tick: Take care to keep the tick's head attached to its body. Do not twist or jerk the tick. This can make the tick's head or mouth parts break off and remain in the skin. Do not squeeze or crush the tick's body. This could force disease-carrying fluids from the tick into your body. Do not try to remove a tick with heat, alcohol, petroleum jelly, or fingernail polish. Using these methods can cause the tick to salivate and regurgitate intoyour bloodstream, increasing your  risk of getting a disease. What should I do after removing a tick? Dispose of the tick. Do not crush a tick with your fingers. Clean the bite area and your hands with soap and water, rubbing alcohol, or an iodine scrub. If an antiseptic cream or ointment is available, apply a small amount to the bite site. Wash and disinfect any instruments that you used to remove the tick. How should I dispose of a tick? To dispose of a live tick, use one of these methods: Place it in rubbing alcohol. Place it in a sealed  bag or container. Wrap it tightly in tape. Flush it down the toilet. Contact a health care provider if: You have symptoms of a disease after a tick bite. Symptoms of a tick-borne disease can occur from moments after the tick bites to 30 days after a tick is removed. Symptoms include: Fever or chills. Any of these signs in the bite area: A red rash that makes a circle (bull's-eye rash) in the bite area. Redness and swelling. Headache. Muscle, joint, or bone pain. Abnormal tiredness. Numbness in your legs or difficulty walking or moving your legs. Tender, swollen lymph glands. A part of a tick breaks off and gets stuck in your skin. Get help right away if: You are not able to remove a tick. You experience muscle weakness or paralysis. Your symptoms get worse or you experience new symptoms. You find an engorged tick on your skin and you are in an area where disease from ticks is a high risk. Summary Ticks may carry germs that can spread to a person through a bite and cause a disease. Wear protective clothing and use insect repellent to prevent tick bites. Follow the instructions on the label. If you find a tick on your body, remove it as soon as possible. If the tick is attached, do not try to remove with heat, alcohol, petroleum jelly, or fingernail polish. Remove the attached tick using fine-tipped tweezers, curved forceps, or a tick-removal tool. Gently pull with steady, upward, even pressure until the tick lets go. Do not twist or jerk the tick. Do not squeeze or crush the tick's body. If you have symptoms of a disease after being bitten by a tick, contact a health care provider. This information is not intended to replace advice given to you by your health care provider. Make sure you discuss any questions you have with your healthcare provider. Document Revised: 12/22/2018 Document Reviewed: 12/22/2018 Elsevier Patient Education  [place].

## 2020-07-25 ENCOUNTER — Telehealth: Payer: Self-pay | Admitting: *Deleted

## 2020-07-25 NOTE — Telephone Encounter (Signed)
Patient called and stated that she was calling wondering if you have received her labwork she had done Friday.   Patient stated that she did not feel good at all through the weekend and doesn't want to run out of antibiotic and it gets worse.   Stated that she was feeling alittle better today.   Please Advise.

## 2020-07-25 NOTE — Telephone Encounter (Signed)
Labs have not yet been resulted.will call you once we receive lab results.

## 2020-07-25 NOTE — Telephone Encounter (Signed)
Patient notified and agreed.  

## 2020-07-26 ENCOUNTER — Other Ambulatory Visit: Payer: Self-pay | Admitting: Family

## 2020-07-26 DIAGNOSIS — S30860D Insect bite (nonvenomous) of lower back and pelvis, subsequent encounter: Secondary | ICD-10-CM

## 2020-07-26 DIAGNOSIS — W57XXXD Bitten or stung by nonvenomous insect and other nonvenomous arthropods, subsequent encounter: Secondary | ICD-10-CM

## 2020-07-26 LAB — CBC WITH DIFFERENTIAL/PLATELET
Absolute Monocytes: 322 cells/uL (ref 200–950)
Basophils Absolute: 40 cells/uL (ref 0–200)
Basophils Relative: 0.3 %
Eosinophils Absolute: 13 cells/uL — ABNORMAL LOW (ref 15–500)
Eosinophils Relative: 0.1 %
HCT: 41.6 % (ref 35.0–45.0)
Hemoglobin: 14.2 g/dL (ref 11.7–15.5)
Lymphs Abs: 764 cells/uL — ABNORMAL LOW (ref 850–3900)
MCH: 29.9 pg (ref 27.0–33.0)
MCHC: 34.1 g/dL (ref 32.0–36.0)
MCV: 87.6 fL (ref 80.0–100.0)
MPV: 8.7 fL (ref 7.5–12.5)
Monocytes Relative: 2.4 %
Neutro Abs: 12261 cells/uL — ABNORMAL HIGH (ref 1500–7800)
Neutrophils Relative %: 91.5 %
Platelets: 232 10*3/uL (ref 140–400)
RBC: 4.75 10*6/uL (ref 3.80–5.10)
RDW: 12.2 % (ref 11.0–15.0)
Total Lymphocyte: 5.7 %
WBC: 13.4 10*3/uL — ABNORMAL HIGH (ref 3.8–10.8)

## 2020-07-26 LAB — COMPLETE METABOLIC PANEL WITH GFR
AG Ratio: 1.4 (calc) (ref 1.0–2.5)
ALT: 20 U/L (ref 6–29)
AST: 18 U/L (ref 10–35)
Albumin: 4.4 g/dL (ref 3.6–5.1)
Alkaline phosphatase (APISO): 68 U/L (ref 37–153)
BUN: 15 mg/dL (ref 7–25)
CO2: 27 mmol/L (ref 20–32)
Calcium: 9.6 mg/dL (ref 8.6–10.4)
Chloride: 105 mmol/L (ref 98–110)
Creat: 0.79 mg/dL (ref 0.50–1.05)
Globulin: 3.2 g/dL (calc) (ref 1.9–3.7)
Glucose, Bld: 109 mg/dL (ref 65–139)
Potassium: 3.6 mmol/L (ref 3.5–5.3)
Sodium: 141 mmol/L (ref 135–146)
Total Bilirubin: 0.4 mg/dL (ref 0.2–1.2)
Total Protein: 7.6 g/dL (ref 6.1–8.1)
eGFR: 83 mL/min/{1.73_m2} (ref >=60)

## 2020-07-26 LAB — LYME DISEASE ANTIBODY WITH REFLEX TO IMMUNOASSAY (IGG, IGM): LYME AB, SCREEN: <=0.9 Index (ref <=0.90)

## 2020-07-26 NOTE — Telephone Encounter (Signed)
See Mychart lab results.If blowing clots out of the nose need to get checked up at urgent care or ED. Lab work was negative for lyme disease.

## 2020-07-26 NOTE — Telephone Encounter (Signed)
Patient called back wanting to hear about her lab results. She states she has improved some, but the spots are bigger in her vision and she is Blowing clots of blood out her nose. To [employer]

## 2020-07-27 ENCOUNTER — Other Ambulatory Visit: Payer: Self-pay

## 2020-07-27 ENCOUNTER — Telehealth: Payer: Self-pay | Admitting: Family Medicine

## 2020-07-27 ENCOUNTER — Encounter: Payer: Self-pay | Admitting: Family

## 2020-07-27 ENCOUNTER — Telehealth (INDEPENDENT_AMBULATORY_CARE_PROVIDER_SITE_OTHER): Payer: Medicare HMO | Admitting: Family

## 2020-07-27 DIAGNOSIS — S30860D Insect bite (nonvenomous) of lower back and pelvis, subsequent encounter: Secondary | ICD-10-CM

## 2020-07-27 DIAGNOSIS — W57XXXD Bitten or stung by nonvenomous insect and other nonvenomous arthropods, subsequent encounter: Secondary | ICD-10-CM

## 2020-07-27 MED ORDER — DOXYCYCLINE HYCLATE 100 MG PO TABS: 100.0000 mg | ORAL_TABLET | Freq: Two times a day (BID) | ORAL | 0 refills | Status: DC

## 2020-07-27 NOTE — Patient Instructions (Signed)
Notify provider or go to ED if symptoms worsen or fail to resolve

## 2020-07-27 NOTE — Progress Notes (Signed)
This service is provided via telemedicine  No vital signs collected/recorded due to the encounter was a telemedicine visit.   Location of patient (ex: home, work):  Home.  Patient consents to a telephone visit:  Yes  Location of the provider (ex: office, home):  [employer].  Name of any referring provider:  [name], NP   Names of all persons participating in the telemedicine service and their role in the encounter:  Patient, Heriberto Antigua, [place], [place], Webb Silversmith, NP.    Time spent on call:  8 minutes spent on the phone with Medical Assistant.     Location:      Place of Service:    Provider: Idelia Caudell FNP-C  [name], NP  Patient Care Team: [name], NP as PCP - General (Geriatric Medicine) Linton Rump, PT as Physical Therapist (Physical Therapy)  Extended Emergency Contact Information Primary Emergency Contact: Kennieth Francois Address: [address]  [place], [place] [address] Johnnette Litter of [place] Phone: [phone] Mobile Phone: [phone] Relation: Spouse Secondary Emergency Contact: Cadince, Hilscher Mobile Phone: [phone] Relation: Son  Code Status:  Full Code  Goals of care: Advanced Directive information Advanced Directives 07/27/2020  Does Patient Have a Medical Advance Directive? No  Would patient like information on creating a medical advance directive? Yes (MAU/Ambulatory/Procedural Areas - Information given)     Chief Complaint  Patient presents with   Follow-up    Patient complains of not feeling better, running fever and wanting more antibiotics.   Referral    Patient wants referral to Infectious Dr.    HPI:  Pt is a 65 y.o. female seen today for an acute visit for not feeling better,running fever and wants more antibiotics.request referral to infectious disease.Tries to do her ADL's but unable  just have to lay down in bed.pain run down on the back.Has had  headache been taking ibuprofen for headache. Temp was 99.5 today Had x one episode of blood clots from nose after sneezing but none since then. No nausea or vomiting.  She was seen 07/15/2018 for lower back tike bite by PCP was prescribed Doxycycline 7 days.After completion of antibiotics stated symptoms returned with general body aches,chills,fever,fatigue.she was sen here at the office and treated with a second round of Doxycycline and CBC/diff ,CMP and Lyme disease Abs lab work was done.Lab results indicated elevated WBC 13.4 with left shift with normal Electrolytes kidney /liver function.Lyme disease antibodies also normal.   Has one more day of doxycycline but state would like another round of antibiotics does not want to go through" hell " like the first time she completed doxycyline.she does not believe Lyme disease test was negative.    Past Medical History:  Diagnosis Date   Benign neoplasm of colon    Conjunctival hemorrhage    Diverticulosis of colon (without mention of hemorrhage)    colonoscopy 8/02, 03/11/07 abnormal   Esophageal reflux    EGD positive 10/06   Hiatal hernia    History of cesarean section    1986 and 1988   History of ethmoidectomy 1989   Impacted cerumen    Papanicolaou smear of cervix with atypical squamous cells of undetermined significance (ASC-US)    colposcopy 3/07, ASCUS   Plantar fascial fibromatosis    Postmenopausal HRT (hormone replacement therapy)    Routine general medical examination at a health care facility    Stricture and stenosis of esophagus  Past Surgical History:  Procedure Laterality Date   CATARACT EXTRACTION  2018   CESAREAN SECTION     1986 and 1988   ETHMOIDECTOMY  1989   GALLBLADDER SURGERY      Allergies  Allergen Reactions   Cefaclor     REACTION: itching    Outpatient Encounter Medications as of 07/27/2020  Medication Sig   acetaminophen (TYLENOL) 500 MG tablet Take 500 mg by mouth as needed.   doxycycline  (VIBRA-TABS) 100 MG tablet Take 1 tablet (100 mg total) by mouth 2 (two) times daily.   Ibuprofen 200 MG CAPS Take 400 mg by mouth every 5 (five) hours as needed.   loratadine (CLARITIN) 10 MG tablet Take 10 mg by mouth daily as needed for allergies.   omeprazole (PRILOSEC) 20 MG capsule Take 1 capsule (20 mg total) by mouth daily.   polyethylene glycol (MIRALAX / GLYCOLAX) 17 g packet Take 17 g by mouth daily as needed.   Probiotic Product (PROBIOTIC ADVANCED) CAPS Take 1 capsule by mouth daily.   No facility-administered encounter medications on file as of 07/27/2020.    Review of Systems  Constitutional:  Positive for chills, fatigue and fever. Negative for appetite change and unexpected weight change.  HENT:  Negative for congestion, dental problem, ear discharge, ear pain, facial swelling, hearing loss, nosebleeds, postnasal drip, rhinorrhea, sinus pressure, sinus pain, sneezing, sore throat, tinnitus and trouble swallowing.   Eyes:  Negative for pain, discharge, redness, itching and visual disturbance.  Respiratory:  Negative for cough, chest tightness, shortness of breath and wheezing.   Cardiovascular:  Negative for chest pain, palpitations and leg swelling.  Gastrointestinal:  Negative for abdominal distention, abdominal pain, blood in stool, constipation, diarrhea, nausea and vomiting.  Genitourinary:  Negative for difficulty urinating, dysuria, flank pain, frequency and urgency.  Musculoskeletal:  Positive for myalgias. Negative for arthralgias, back pain, gait problem, joint swelling, neck pain and neck stiffness.  Skin:  Negative for color change, pallor, rash and wound.  Neurological:  Positive for headaches. Negative for dizziness, syncope, speech difficulty, weakness, light-headedness and numbness.  Hematological:  Does not bruise/bleed easily.  Psychiatric/Behavioral:  Negative for agitation, behavioral problems, confusion, hallucinations and sleep disturbance. The patient is  not nervous/anxious.    Immunization History  Administered Date(s) Administered   Influenza, Seasonal, Injecte, Preservative Fre 10/12/2015, 10/16/2017   Influenza,inj,Quad PF,6+ Mos 10/12/2014, 09/05/2016, 09/10/2018   Moderna Sars-Covid-2 Vaccination 02/19/2019, 03/20/2019, 12/04/2019   Pneumococcal Conjugate-13 07/04/2020   Tdap 08/02/2011   Pertinent  Health Maintenance Due  Topic Date Due   MAMMOGRAM  07/09/2019   DEXA SCAN  Never done   PAP SMEAR-Modifier  03/13/2020   INFLUENZA VACCINE  08/08/2020   PNA vac Low Risk Adult (2 of 2 - PPSV23) 07/04/2021   COLONOSCOPY (Pts 45-93yrs Insurance coverage will need to be confirmed)  04/16/2027   Fall Risk  07/27/2020 07/22/2020 07/07/2020  Falls in the past year? 0 0 0  Number falls in past yr: 0 0 0  Injury with Fall? 0 0 0  Risk for fall due to : No Fall Risks No Fall Risks No Fall Risks  Follow up Falls evaluation completed Falls evaluation completed Falls evaluation completed   Functional Status Survey:    There were no vitals filed for this visit. There is no height or weight on file to calculate BMI.  Physical Exam Unable to complete on telephone visit   Labs reviewed: Recent Labs    07/04/20 1422 07/22/20 0000  NA 141 141  K 3.7 3.6  CL 105 105  CO2 25 27  GLUCOSE 96 109  BUN 19 15  CREATININE 0.78 0.79  CALCIUM 9.4 9.6   Recent Labs    07/04/20 1422 07/22/20 0000  AST 18 18  ALT 25 20  BILITOT 0.4 0.4  PROT 7.5 7.6   Recent Labs    07/04/20 1422 07/22/20 0000  WBC 7.7 13.4*  NEUTROABS 4,027 12,261*  HGB 14.2 14.2  HCT 42.3 41.6  MCV 87.9 87.6  PLT 222 232   Lab Results  Component Value Date   TSH 1.77 07/30/2013   No results found for: HGBA1C Lab Results  Component Value Date   CHOL 233 (H) 07/04/2020   HDL 54 07/04/2020   LDLCALC 155 (H) 07/04/2020   LDLDIRECT 153.2 08/02/2011   TRIG 120 07/04/2020   CHOLHDL 4.3 07/04/2020    Significant Diagnostic Results in last 30 days:  No  results found.  Assessment/Plan  Tick bite of lower back, subsequent encounter Reports fever,chills,mylagia and fatigue.lyme disease test negative but patient doubting result since she still not feeling well.  Will refer  to infectious disease for further evaluation.request another round of Doxycycline while she awaits ID appointment. - doxycycline (VIBRA-TABS) 100 MG tablet; Take 1 tablet (100 mg total) by mouth 2 (two) times daily.  Dispense: 14 tablet; Refill: 0 - Ambulatory referral to Infectious Disease   Family/ staff Communication: Reviewed plan of care with patient verbalize understanding.   Labs/tests ordered: None   Next Appointment: As needed if symptoms worsen or fail to improve   I connected with  Doloris Hall on 07/27/20 by a telephone enabled telemedicine application and verified that I am speaking with the correct person using two identifiers.   I discussed the limitations of evaluation and management by telemedicine. The patient expressed understanding and agreed to proceed.  Spent 15 minutes of non-face to face on telephone with patient      Sandrea Hughs, NP

## 2020-07-27 NOTE — Telephone Encounter (Signed)
Records refaxed to [facility] as I received 2nd request for records. Iniially faxed 6/7

## 2020-07-28 NOTE — Telephone Encounter (Signed)
DWP, Infectious disease appointment scheduled.

## 2020-08-03 ENCOUNTER — Other Ambulatory Visit: Payer: Self-pay

## 2020-08-03 ENCOUNTER — Ambulatory Visit (INDEPENDENT_AMBULATORY_CARE_PROVIDER_SITE_OTHER): Payer: Medicare HMO | Admitting: Internal Medicine

## 2020-08-03 ENCOUNTER — Encounter: Payer: Self-pay | Admitting: Internal Medicine

## 2020-08-03 DIAGNOSIS — M791 Myalgia, unspecified site: Secondary | ICD-10-CM
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
## 2020-08-04 LAB — CBC WITH DIFFERENTIAL/PLATELET
Absolute Monocytes: 484 cells/uL (ref 200–950)
Basophils Absolute: 47 cells/uL (ref 0–200)
Basophils Relative: 0.5 %
Eosinophils Absolute: 102 cells/uL (ref 15–500)
Eosinophils Relative: 1.1 %
HCT: 41 % (ref 35.0–45.0)
Hemoglobin: 13.5 g/dL (ref 11.7–15.5)
Lymphs Abs: 2548 cells/uL (ref 850–3900)
MCH: 28.7 pg (ref 27.0–33.0)
MCHC: 32.9 g/dL (ref 32.0–36.0)
MCV: 87 fL (ref 80.0–100.0)
MPV: 8.9 fL (ref 7.5–12.5)
Monocytes Relative: 5.2 %
Neutro Abs: 6119 cells/uL (ref 1500–7800)
Neutrophils Relative %: 65.8 %
Platelets: 350 10*3/uL (ref 140–400)
RBC: 4.71 10*6/uL (ref 3.80–5.10)
RDW: 11.9 % (ref 11.0–15.0)
Total Lymphocyte: 27.4 %
WBC: 9.3 10*3/uL (ref 3.8–10.8)

## 2020-08-04 LAB — HIV ANTIBODY (ROUTINE TESTING W REFLEX): HIV 1&2 Ab, 4th Generation: NONREACTIVE

## 2020-08-04 LAB — LACTATE DEHYDROGENASE: LDH: 116 U/L — ABNORMAL LOW (ref 120–250)

## 2020-08-04 LAB — C-REACTIVE PROTEIN: CRP: 6.6 mg/L (ref <8.0)

## 2020-08-04 LAB — CK: Total CK: 43 U/L (ref 29–143)

## 2020-08-04 LAB — SEDIMENTATION RATE: Sed Rate: 45 mm/h — ABNORMAL HIGH (ref 0–30)

## 2020-08-09 ENCOUNTER — Ambulatory Visit: Payer: Medicare HMO | Admitting: Nurse Practitioner

## 2020-08-17 ENCOUNTER — Ambulatory Visit: Payer: Medicare HMO | Admitting: Nurse Practitioner

## 2020-08-31 DIAGNOSIS — H524 Presbyopia: Secondary | ICD-10-CM | POA: Diagnosis not present

## 2020-09-21 DIAGNOSIS — Z800 Family history of malignant neoplasm of digestive organs: Secondary | ICD-10-CM | POA: Diagnosis not present

## 2020-09-21 DIAGNOSIS — Z86010 Personal history of colonic polyps: Secondary | ICD-10-CM | POA: Diagnosis not present

## 2020-09-21 DIAGNOSIS — K648 Other hemorrhoids: Secondary | ICD-10-CM | POA: Diagnosis not present

## 2020-10-10 DIAGNOSIS — R059 Cough, unspecified: Secondary | ICD-10-CM | POA: Diagnosis not present

## 2020-10-13 DIAGNOSIS — J019 Acute sinusitis, unspecified: Secondary | ICD-10-CM | POA: Diagnosis not present

## 2020-10-19 DIAGNOSIS — K648 Other hemorrhoids: Secondary | ICD-10-CM | POA: Diagnosis not present

## 2020-10-24 DIAGNOSIS — J0111 Acute recurrent frontal sinusitis: Secondary | ICD-10-CM | POA: Diagnosis not present

## 2020-11-02 DIAGNOSIS — K648 Other hemorrhoids: Secondary | ICD-10-CM | POA: Diagnosis not present

## 2020-11-03 ENCOUNTER — Other Ambulatory Visit: Payer: Self-pay | Admitting: Nurse Practitioner

## 2020-11-09 ENCOUNTER — Ambulatory Visit: Payer: Medicare HMO | Admitting: Orthopaedic Surgery

## 2020-11-09 ENCOUNTER — Other Ambulatory Visit: Payer: Self-pay

## 2020-11-09 ENCOUNTER — Ambulatory Visit: Payer: Self-pay

## 2020-11-09 ENCOUNTER — Encounter: Payer: Self-pay | Admitting: Orthopaedic Surgery

## 2020-11-09 DIAGNOSIS — M7542 Impingement syndrome of left shoulder: Secondary | ICD-10-CM | POA: Diagnosis not present

## 2020-11-09 DIAGNOSIS — M25512 Pain in left shoulder: Secondary | ICD-10-CM

## 2020-11-09 DIAGNOSIS — M542 Cervicalgia: Secondary | ICD-10-CM | POA: Diagnosis not present

## 2020-11-09 MED ORDER — METHYLPREDNISOLONE 4 MG PO TABS
ORAL_TABLET | ORAL | 0 refills | Status: DC
Start: 2020-11-09 — End: 2020-12-07

## 2020-11-09 MED ORDER — METHYLPREDNISOLONE ACETATE 40 MG/ML IJ SUSP
40.0000 mg | INTRAMUSCULAR | Status: AC | PRN
  Administered 2020-11-09: 40 mg via INTRA_ARTICULAR

## 2020-11-09 MED ORDER — LIDOCAINE HCL 1 % IJ SOLN
3.0000 mL | INTRAMUSCULAR | Status: AC | PRN
  Administered 2020-11-09: 3 mL

## 2020-11-09 MED ORDER — TIZANIDINE HCL 4 MG PO TABS: 4.0000 mg | ORAL_TABLET | Freq: Three times a day (TID) | ORAL | 1 refills | Status: DC | PRN

## 2020-11-09 NOTE — Progress Notes (Signed)
Office Visit Note   Patient: Penny Gardner           Date of Birth: 09/07/1955           MRN: [id number] Visit Date: 11/09/2020              Requested by: [name], [name],  [place] [id number] PCP: [name], NP   Assessment & Plan: Visit Diagnoses:  1. Neck pain   2. Acute pain of left shoulder   3. Impingement syndrome of left shoulder     Plan: She has had experience with physical therapy for a [place] [facility] and so we will send her there for any modalities that can treat her left shoulder pain and her neck pain.  I will also put her on a 6-day steroid taper and a muscle relaxant which will be Zanaflex.  I did recommend a steroid injection in the left shoulder subacromial space.  She agreed to this and tolerated well.  All question concerns were answered and addressed.  We will reevaluate in 4 weeks.  Follow-Up Instructions: Return in about 4 weeks (around 12/07/2020).   Orders:  Orders Placed This Encounter  Procedures   Large Joint Inj   XR Cervical Spine 2 or 3 views   XR Shoulder Left   Meds ordered this encounter  Medications   methylPREDNISolone (MEDROL) 4 MG tablet    Sig: Medrol dose pack. Take as instructed    Dispense:  21 tablet    Refill:  0   tiZANidine (ZANAFLEX) 4 MG tablet    Sig: Take 1 tablet (4 mg total) by mouth every 8 (eight) hours as needed for muscle spasms.    Dispense:  40 tablet    Refill:  1      Procedures: Large Joint Inj: L subacromial bursa on 11/09/2020 9:15 AM Indications: pain and diagnostic evaluation Details: 22 G 1.5 in needle  Arthrogram: No  Medications: 3 mL lidocaine 1 %; 40 mg methylPREDNISolone acetate 40 MG/ML Outcome: tolerated well, no immediate complications Procedure, treatment alternatives, risks and benefits explained, specific risks discussed. Consent was given by the patient. Immediately prior to procedure a time out was called to verify the correct  patient, procedure, equipment, support staff and site/side marked as required. Patient was prepped and draped in the usual sterile fashion.      Clinical Data: No additional findings.   Subjective: Chief Complaint  Patient presents with   Neck - Pain   Left Shoulder - Pain  The patient is a 65 year old female that has been a patient of the practice before.  This was for her right shoulder.  She had seen Dr. Junius Roads for this and with physical therapy her shoulder got significantly better after dealing with significant impingement and tendinitis.  She has been dealing with left shoulder pain now since July with no known injury.  She reports neck stiffness and decreased motion but also some numbness and tingling going down into her left hand on the ulnar aspect.  She has not had any type of surgery on that left shoulder and she denies any injections.  She is tried some Tylenol and she can take NSAIDs due to other medical issues.  She is not on blood thinning medication and is not a diabetic.  She reports that reaching overhead and behind her as well as across the phone hurts her quite a bit with that left shoulder and it does wake  her up at night.  HPI  Review of Systems   Objective: Vital Signs: There were no vitals taken for this visit.  Physical Exam She is alert and orient x3 and in no acute distress Ortho Exam She does have neck stiffness with lateral rotation and bending to the left side.  There is no weakness in her triceps but there is pain with positive Neer and Hawkins signs of the left shoulder with no significant weakness but a lot of this is giveaway weakness secondary to the pain that she is experiencing with her left shoulder.  She has normal grip strength and no loss of intrinsic function of the left hand but subjective numbness in the lateral aspect of the hand.  There is negative Tinel's sign at the elbow. Specialty Comments:  No specialty comments available.  Imaging: XR  Cervical Spine 2 or 3 views  Result Date: 11/09/2020 2 views of the cervical spine showed mid cervical degenerative changes with loss of disc space but no malalignment.  XR Shoulder Left  Result Date: 11/09/2020 3 views of the left shoulder show no acute findings.  The shoulder is well located.    PMFS History: Patient Active Problem List   Diagnosis Date Noted   Myalgia 08/03/2020   Suprapubic pain 01/29/2018   Left upper eyelid ulcer 10/29/2017   Ganglion cyst 08/25/2014   HLD (hyperlipidemia) 08/04/2013   Encounter for routine gynecological examination 08/04/2013   Routine general medical examination at a health care facility 08/02/2011   Diverticulosis of colon 03/11/2007   GERD 10/08/2004   Past Medical History:  Diagnosis Date   Benign neoplasm of colon    Conjunctival hemorrhage    Diverticulosis of colon (without mention of hemorrhage)    colonoscopy 8/02, 03/11/07 abnormal   Esophageal reflux    EGD positive 10/06   Hiatal hernia    History of cesarean section    1986 and 1988   History of ethmoidectomy 1989   Impacted cerumen    Papanicolaou smear of cervix with atypical squamous cells of undetermined significance (ASC-US)    colposcopy 3/07, ASCUS   Plantar fascial fibromatosis    Postmenopausal HRT (hormone replacement therapy)    Routine general medical examination at a health care facility    Stricture and stenosis of esophagus     Family History  Problem Relation Age of Onset   Cancer Mother        colon   Heart attack Mother 66   Suicidality Father    Diabetes Father    Cancer Maternal Uncle        colon   Stroke Maternal Grandmother    Diabetes Paternal Grandmother     Past Surgical History:  Procedure Laterality Date   CATARACT EXTRACTION  2018   [place] [facility] [place] History   Occupational History   Not on file  Tobacco Use   Smoking status: Never   Smokeless  tobacco: Never  Vaping Use   Vaping Use: Never used  Substance and Sexual Activity   Alcohol use: Yes    Alcohol/week: 5.0 standard drinks    Types: 5 Standard drinks or equivalent per week   Drug use: Never   Sexual activity: Yes

## 2020-11-11 DIAGNOSIS — L718 Other rosacea: Secondary | ICD-10-CM | POA: Diagnosis not present

## 2020-11-11 DIAGNOSIS — L218 Other seborrheic dermatitis: Secondary | ICD-10-CM | POA: Diagnosis not present

## 2020-11-11 DIAGNOSIS — D2239 Melanocytic nevi of other parts of face: Secondary | ICD-10-CM | POA: Diagnosis not present

## 2020-11-11 DIAGNOSIS — L738 Other specified follicular disorders: Secondary | ICD-10-CM | POA: Diagnosis not present

## 2020-11-17 DIAGNOSIS — M545 Low back pain, unspecified: Secondary | ICD-10-CM | POA: Diagnosis not present

## 2020-11-17 DIAGNOSIS — M25512 Pain in left shoulder: Secondary | ICD-10-CM | POA: Diagnosis not present

## 2020-11-17 DIAGNOSIS — Z23 Encounter for immunization: Secondary | ICD-10-CM | POA: Diagnosis not present

## 2020-11-17 DIAGNOSIS — M25532 Pain in left wrist: Secondary | ICD-10-CM | POA: Diagnosis not present

## 2020-11-17 DIAGNOSIS — M25531 Pain in right wrist: Secondary | ICD-10-CM | POA: Diagnosis not present

## 2020-11-21 DIAGNOSIS — M545 Low back pain, unspecified: Secondary | ICD-10-CM | POA: Diagnosis not present

## 2020-11-21 DIAGNOSIS — M25512 Pain in left shoulder: Secondary | ICD-10-CM | POA: Diagnosis not present

## 2020-11-21 DIAGNOSIS — M25532 Pain in left wrist: Secondary | ICD-10-CM | POA: Diagnosis not present

## 2020-11-21 DIAGNOSIS — M25531 Pain in right wrist: Secondary | ICD-10-CM | POA: Diagnosis not present

## 2020-11-23 DIAGNOSIS — M545 Low back pain, unspecified: Secondary | ICD-10-CM | POA: Diagnosis not present

## 2020-11-23 DIAGNOSIS — M25531 Pain in right wrist: Secondary | ICD-10-CM | POA: Diagnosis not present

## 2020-11-23 DIAGNOSIS — M25512 Pain in left shoulder: Secondary | ICD-10-CM | POA: Diagnosis not present

## 2020-11-23 DIAGNOSIS — M25532 Pain in left wrist: Secondary | ICD-10-CM | POA: Diagnosis not present

## 2020-11-28 DIAGNOSIS — M545 Low back pain, unspecified: Secondary | ICD-10-CM | POA: Diagnosis not present

## 2020-11-28 DIAGNOSIS — M25531 Pain in right wrist: Secondary | ICD-10-CM | POA: Diagnosis not present

## 2020-11-28 DIAGNOSIS — M25532 Pain in left wrist: Secondary | ICD-10-CM | POA: Diagnosis not present

## 2020-11-28 DIAGNOSIS — M25512 Pain in left shoulder: Secondary | ICD-10-CM | POA: Diagnosis not present

## 2020-11-30 DIAGNOSIS — M25531 Pain in right wrist: Secondary | ICD-10-CM | POA: Diagnosis not present

## 2020-11-30 DIAGNOSIS — M25512 Pain in left shoulder: Secondary | ICD-10-CM | POA: Diagnosis not present

## 2020-11-30 DIAGNOSIS — M25532 Pain in left wrist: Secondary | ICD-10-CM | POA: Diagnosis not present

## 2020-11-30 DIAGNOSIS — M545 Low back pain, unspecified: Secondary | ICD-10-CM | POA: Diagnosis not present

## 2020-12-07 ENCOUNTER — Encounter: Payer: Self-pay | Admitting: Orthopaedic Surgery

## 2020-12-07 ENCOUNTER — Ambulatory Visit: Payer: Medicare HMO | Admitting: Orthopaedic Surgery

## 2020-12-07 DIAGNOSIS — M7542 Impingement syndrome of left shoulder: Secondary | ICD-10-CM | POA: Diagnosis not present

## 2020-12-07 NOTE — Addendum Note (Signed)
Addended by: Robyne Peers on: 12/07/2020 10:38 AM   Modules accepted: Orders

## 2020-12-07 NOTE — Progress Notes (Signed)
HPI: Mrs. Milliner returns today for follow-up of her left shoulder and neck.  She states her neck is no longer bothering her.  She still having pain range of motion of the left shoulder despite injection Medrol Dosepak and 8 visits to physical therapy.  She states she is having difficulty sleeping.  States she is unable to bring her arm completely overhead due to the pain.  She feels she only got about 25% improvement with the conservative treatment.  Pains been ongoing since July.  She is no longer having any radicular symptoms down the left arm into the hand.  Review of systems: See HPI otherwise negative  Physical exam: General well-developed well-nourished female no acute distress mood and affect appropriate Bilateral shoulders: 5-5 strength external and internal rotation against resistance.  Empty can test is negative bilaterally.  Impingement testing positive on the left.  Unable to perform liftoff exam on the left 5 out of 5 strength on the right with liftoff against resistance.  Passively  approximately 170 degrees overhead but this is quite painful on the left.   Impression: Left shoulder impingement  Plan: Recommend MRI left shoulder rule out rotator cuff tear due to the fact the patient has failed conservative treatment which included medications, time and activity modification and subacromial injection.  She will follow-up with [place] after the MRI to go over results discuss further treatment.  She will hold physical therapy for now but will continue to work on range of motion of the shoulder in the interim.  Questions encouraged and answered.

## 2020-12-21 ENCOUNTER — Other Ambulatory Visit: Payer: Medicare HMO

## 2020-12-24 ENCOUNTER — Other Ambulatory Visit: Payer: Medicare HMO

## 2020-12-28 ENCOUNTER — Ambulatory Visit
Admission: RE | Admit: 2020-12-28 | Discharge: 2020-12-28 | Disposition: A | Discharge status: Home / Self Care | Payer: Medicare HMO | Source: Ambulatory Visit | Attending: Nurse Practitioner | Admitting: Nurse Practitioner

## 2020-12-28 DIAGNOSIS — Z1231 Encounter for screening mammogram for malignant neoplasm of breast: Secondary | ICD-10-CM | POA: Diagnosis not present

## 2020-12-28 DIAGNOSIS — M8589 Other specified disorders of bone density and structure, multiple sites: Secondary | ICD-10-CM | POA: Diagnosis not present

## 2020-12-28 DIAGNOSIS — Z780 Asymptomatic menopausal state: Secondary | ICD-10-CM | POA: Diagnosis not present

## 2020-12-28 DIAGNOSIS — E2839 Other primary ovarian failure: Secondary | ICD-10-CM

## 2020-12-28 IMAGING — MG MM DIGITAL SCREENING BILAT W/ TOMO AND CAD
8 series · 8 of 24 positions shown · non-contrast
Comparison: Previous exam(s).

CLINICAL DATA: Screening.

EXAM:
DIGITAL SCREENING BILATERAL MAMMOGRAM WITH TOMOSYNTHESIS AND CAD
TECHNIQUE: Bilateral screening digital craniocaudal and mediolateral oblique
mammograms were obtained. Bilateral screening digital breast
tomosynthesis was performed. The images were evaluated with
computer-aided detection.

[R MLO synth-2D]
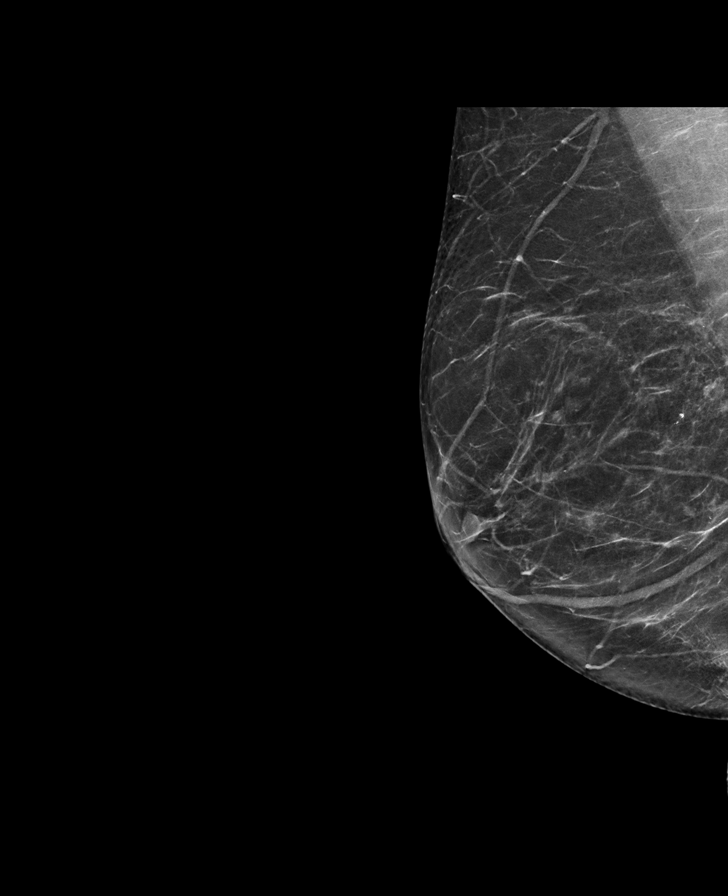

[R CC synth-2D]
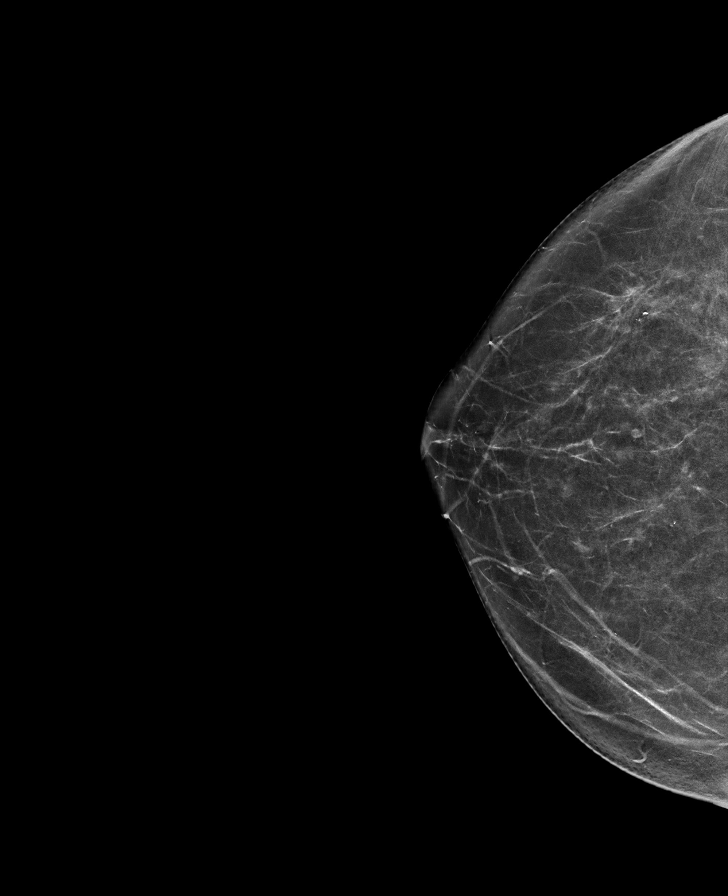

[L CC synth-2D]
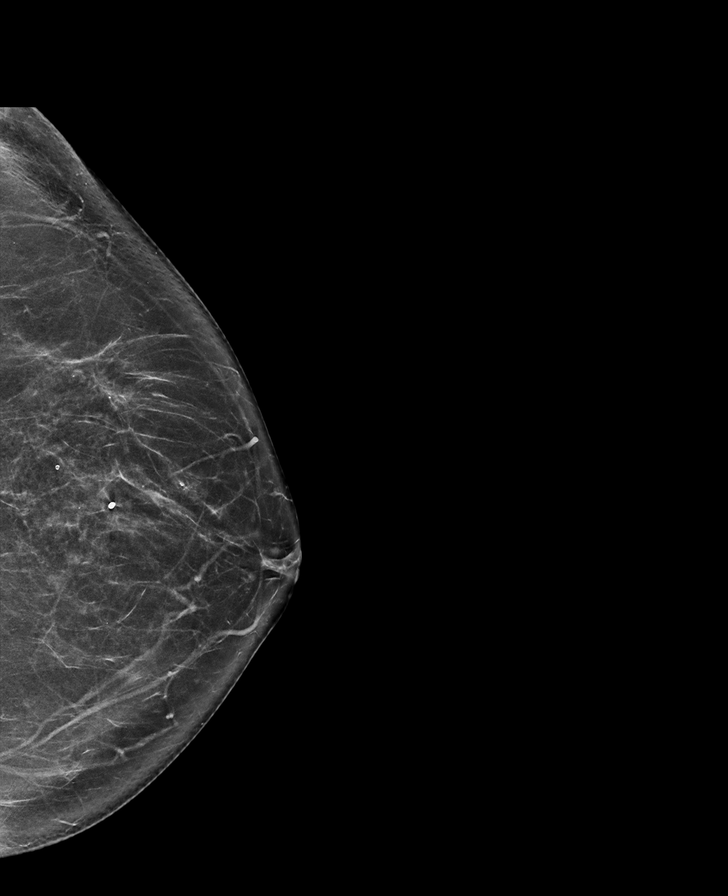

[L MLO synth-2D]
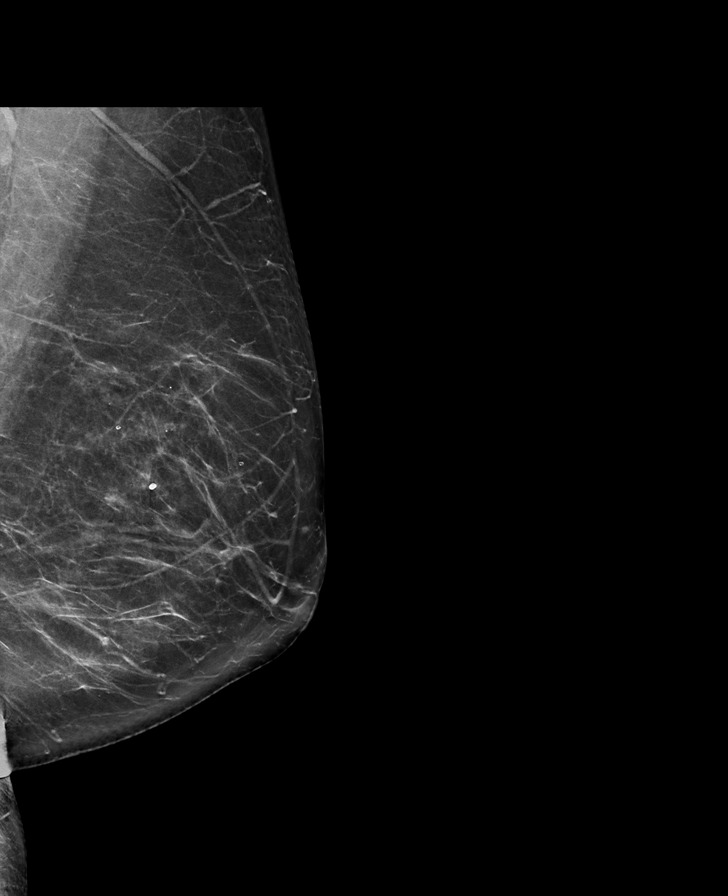

[L MLO tomo · tomo slice 39/78.0]
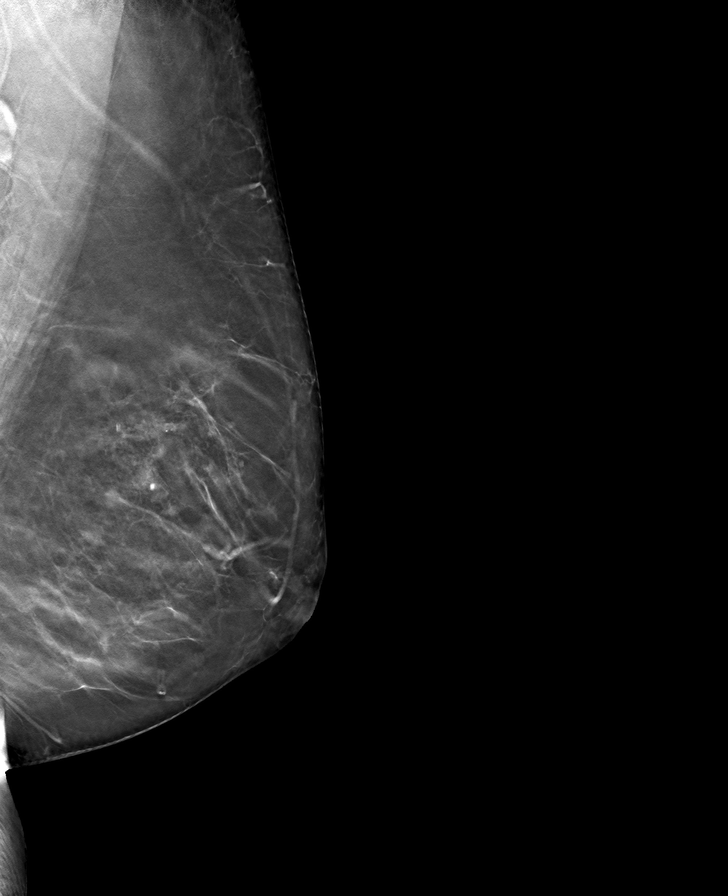

[R CC tomo · tomo slice 35/70.0]
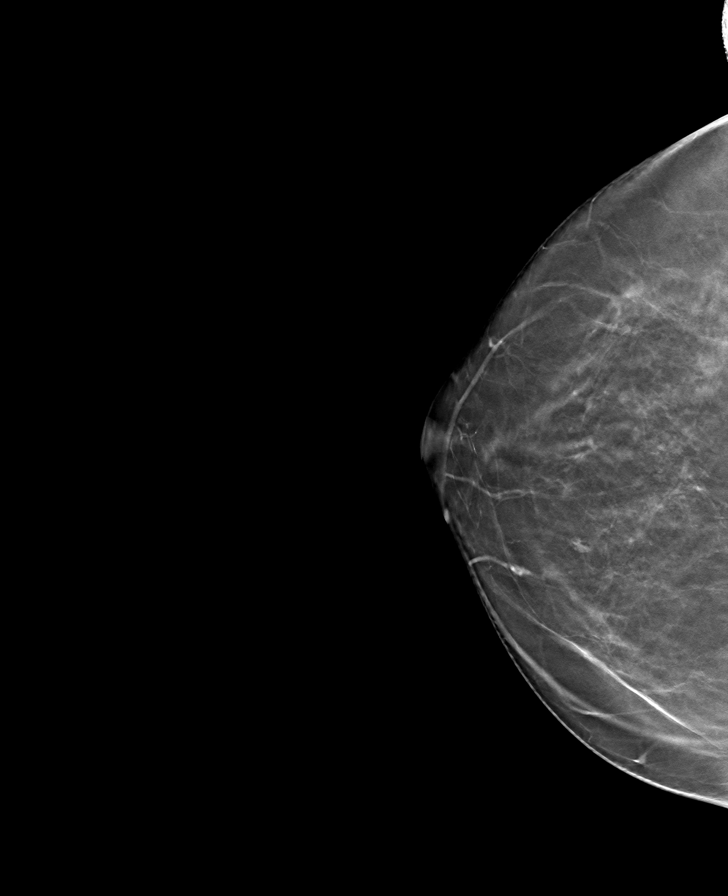

[R MLO tomo · tomo slice 37/74.0]
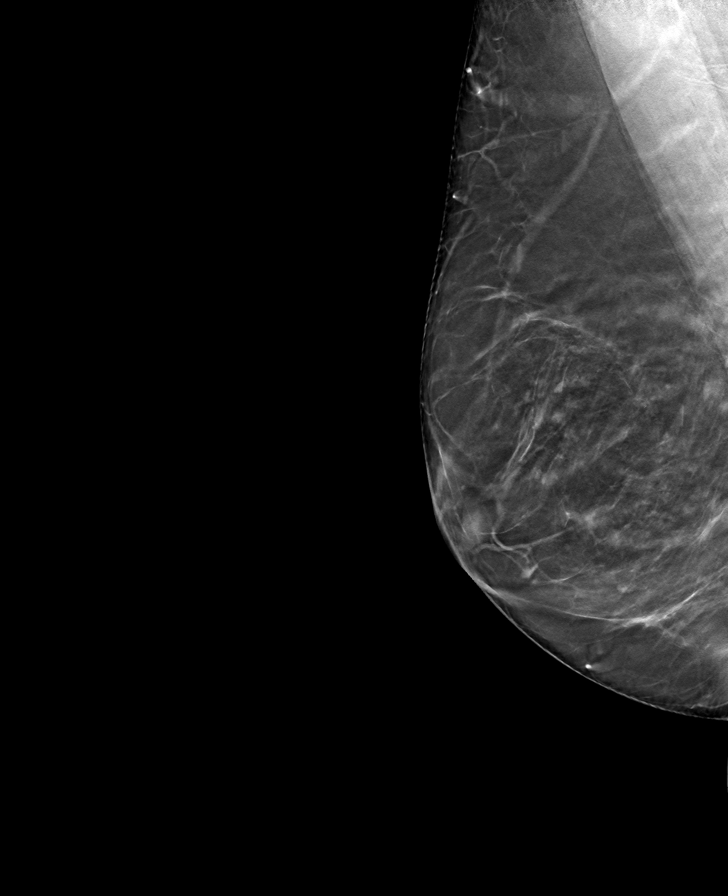

[L CC tomo · tomo slice 38/75.0]
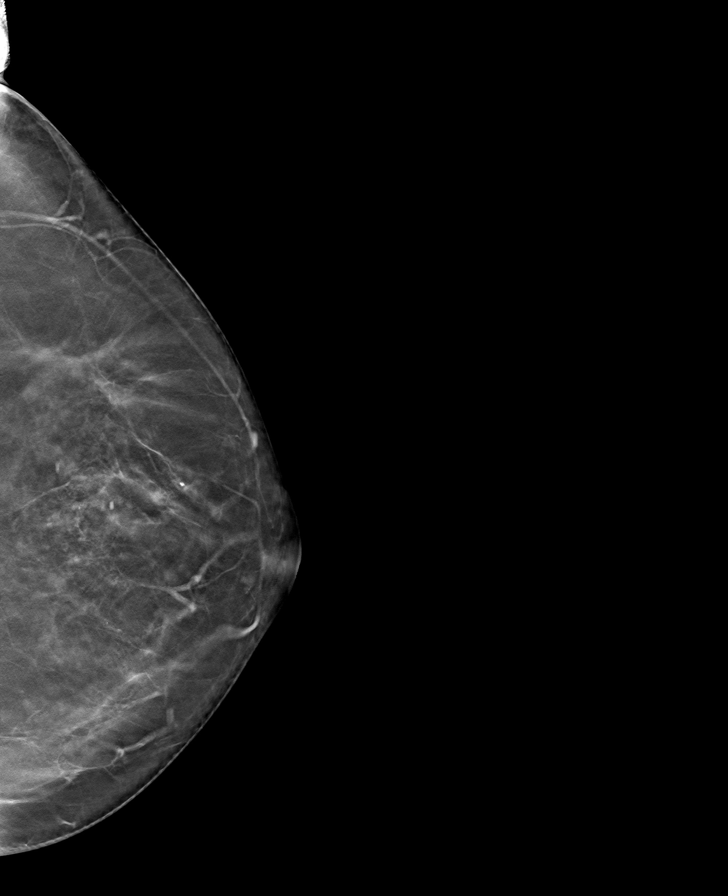

[8 of 24 positions shown; findings below may reference images not displayed]

ACR Breast Density Category b: There are scattered areas of
fibroglandular density.
FINDINGS: In the left breast, a possible mass warrants further evaluation. In
the right breast, no findings suspicious for malignancy.
IMPRESSION: Further evaluation is suggested for a possible mass in the left
breast.

RECOMMENDATION:
Diagnostic mammogram and possibly ultrasound of the left breast.
(Code:[KK])

The patient will be contacted regarding the findings, and additional
imaging will be scheduled.

BI-RADS CATEGORY  0: Incomplete. Need additional imaging evaluation
and/or prior mammograms for comparison.

## 2020-12-29 ENCOUNTER — Other Ambulatory Visit: Payer: Self-pay | Admitting: Nurse Practitioner

## 2020-12-29 ENCOUNTER — Telehealth: Payer: Self-pay

## 2020-12-29 DIAGNOSIS — R928 Other abnormal and inconclusive findings on diagnostic imaging of breast: Secondary | ICD-10-CM

## 2020-12-29 MED ORDER — CALCIUM CARBONATE 600 MG PO TABS: 600.0000 mg | ORAL_TABLET | Freq: Two times a day (BID) | ORAL | Status: DC

## 2020-12-29 MED ORDER — VITAMIN D3 50 MCG (2000 UT) PO CAPS: 2000.0000 [IU] | ORAL_CAPSULE | Freq: Every day | ORAL | Status: DC

## 2020-12-29 NOTE — Telephone Encounter (Signed)
Medication added to list.

## 2020-12-30 ENCOUNTER — Other Ambulatory Visit: Payer: Self-pay

## 2021-01-04 ENCOUNTER — Ambulatory Visit: Payer: Medicare HMO | Admitting: Orthopaedic Surgery

## 2021-01-04 ENCOUNTER — Ambulatory Visit: Payer: Medicare HMO | Admitting: Nurse Practitioner

## 2021-01-06 ENCOUNTER — Ambulatory Visit: Payer: Medicare HMO | Admitting: Nurse Practitioner

## 2021-01-07 ENCOUNTER — Ambulatory Visit
Admission: RE | Admit: 2021-01-07 | Discharge: 2021-01-07 | Disposition: A | Discharge status: Home / Self Care | Payer: Medicare HMO | Source: Ambulatory Visit | Attending: Physician Assistant | Admitting: Physician Assistant

## 2021-01-07 ENCOUNTER — Other Ambulatory Visit: Payer: Self-pay

## 2021-01-07 DIAGNOSIS — S46012A Strain of muscle(s) and tendon(s) of the rotator cuff of left shoulder, initial encounter: Secondary | ICD-10-CM | POA: Diagnosis not present

## 2021-01-07 DIAGNOSIS — M7542 Impingement syndrome of left shoulder: Secondary | ICD-10-CM

## 2021-01-07 DIAGNOSIS — M19012 Primary osteoarthritis, left shoulder: Secondary | ICD-10-CM | POA: Diagnosis not present

## 2021-01-16 ENCOUNTER — Ambulatory Visit (INDEPENDENT_AMBULATORY_CARE_PROVIDER_SITE_OTHER): Payer: Medicare HMO | Admitting: Orthopaedic Surgery

## 2021-01-16 ENCOUNTER — Encounter: Payer: Self-pay | Admitting: Orthopaedic Surgery

## 2021-01-16 ENCOUNTER — Other Ambulatory Visit: Payer: Self-pay

## 2021-01-16 DIAGNOSIS — M75112 Incomplete rotator cuff tear or rupture of left shoulder, not specified as traumatic: Secondary | ICD-10-CM | POA: Diagnosis not present

## 2021-01-16 DIAGNOSIS — M25512 Pain in left shoulder: Secondary | ICD-10-CM

## 2021-01-16 DIAGNOSIS — M7542 Impingement syndrome of left shoulder: Secondary | ICD-10-CM

## 2021-01-16 NOTE — Progress Notes (Signed)
The patient is well-known to my clinic.  She has debilitating left shoulder pain and weakness.  She has tried and failed conservative treatment for over a year including activity modification, anti-inflammatories, outpatient physical therapy and steroid injections.  She is a former and her shoulder pain and weakness is getting worse as well as range of motion is getting worse.  She is here today to review the MRI of her left shoulder.  On exam her left shoulder has limited range of motion and weakness and significant pain past 90 degrees of abduction.  This is worsening from my previous exams.  An MRI of her left shoulder does show a partial to near full-thickness rotator cuff tear of the left shoulder with no atrophy and no retraction.  At this point I am recommending an arthroscopic intervention based on her clinical exam findings, the MRI findings and failure of conservative treatment of her left shoulder.  I use a shoulder model and explained in detail what surgery on the left shoulder will involved.  I discussed the risk and benefits of surgery and talked about intraoperative and postoperative course in detail.  All questions and concerns were answered and addressed.  We will work on getting this scheduled at this standpoint.

## 2021-01-18 ENCOUNTER — Ambulatory Visit
Admission: RE | Admit: 2021-01-18 | Discharge: 2021-01-18 | Disposition: A | Discharge status: Home / Self Care | Payer: Medicare HMO | Source: Ambulatory Visit | Attending: Nurse Practitioner | Admitting: Nurse Practitioner

## 2021-01-18 DIAGNOSIS — R928 Other abnormal and inconclusive findings on diagnostic imaging of breast: Secondary | ICD-10-CM

## 2021-01-18 DIAGNOSIS — R922 Inconclusive mammogram: Secondary | ICD-10-CM | POA: Diagnosis not present

## 2021-01-18 IMAGING — US US BREAST*L* LIMITED INC AXILLA
1 series · 7 of 7 positions shown · non-contrast
Comparison: Previous exam(s).

CLINICAL DATA: Recall for possible mass in the left breast.

EXAM:
DIGITAL DIAGNOSTIC UNILATERAL LEFT MAMMOGRAM WITH TOMOSYNTHESIS AND
CAD; ULTRASOUND LEFT BREAST LIMITED
TECHNIQUE: Left digital diagnostic mammography and breast tomosynthesis was
performed. The images were evaluated with computer-aided detection.;
Targeted ultrasound examination of the left breast was performed.

[Series 1: us breast*left* limited inc axilla · 0.07mm/px · 7 of 7 slices shown]
[im 1/7]
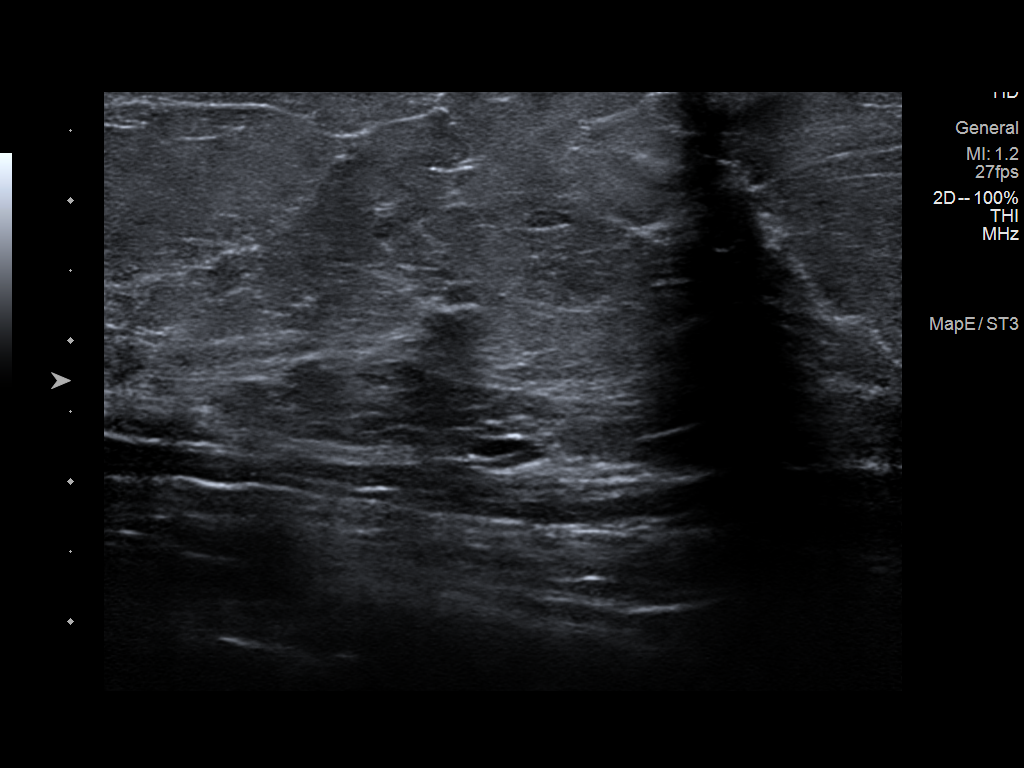
[im 2/7]
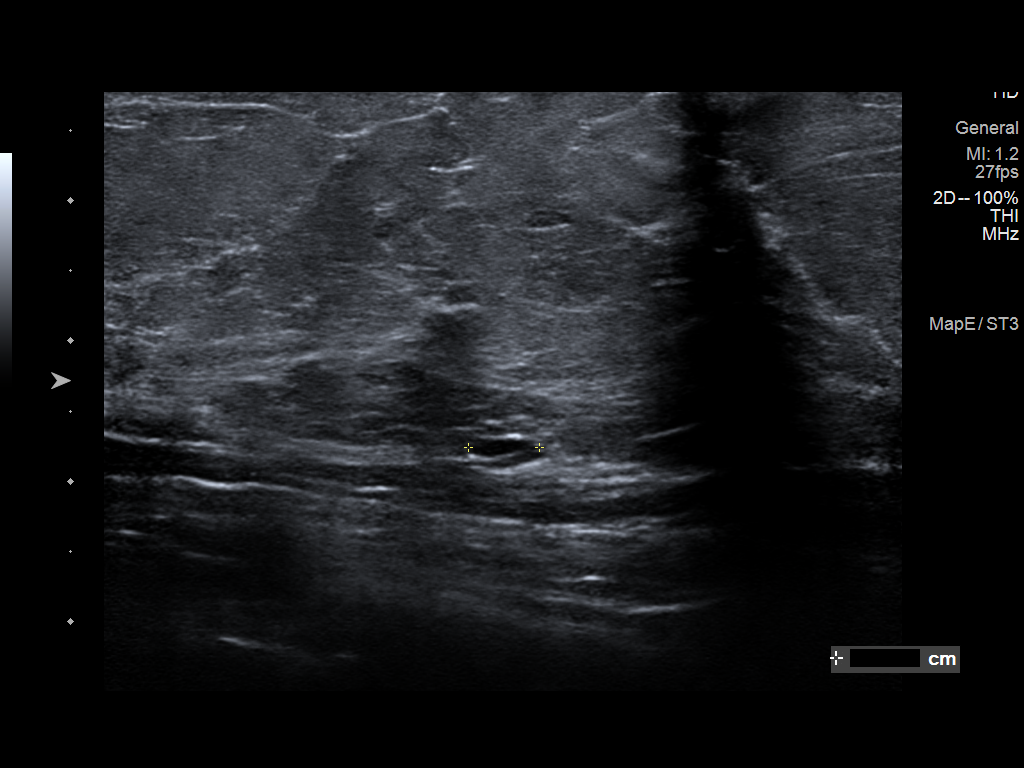
[im 3/7]
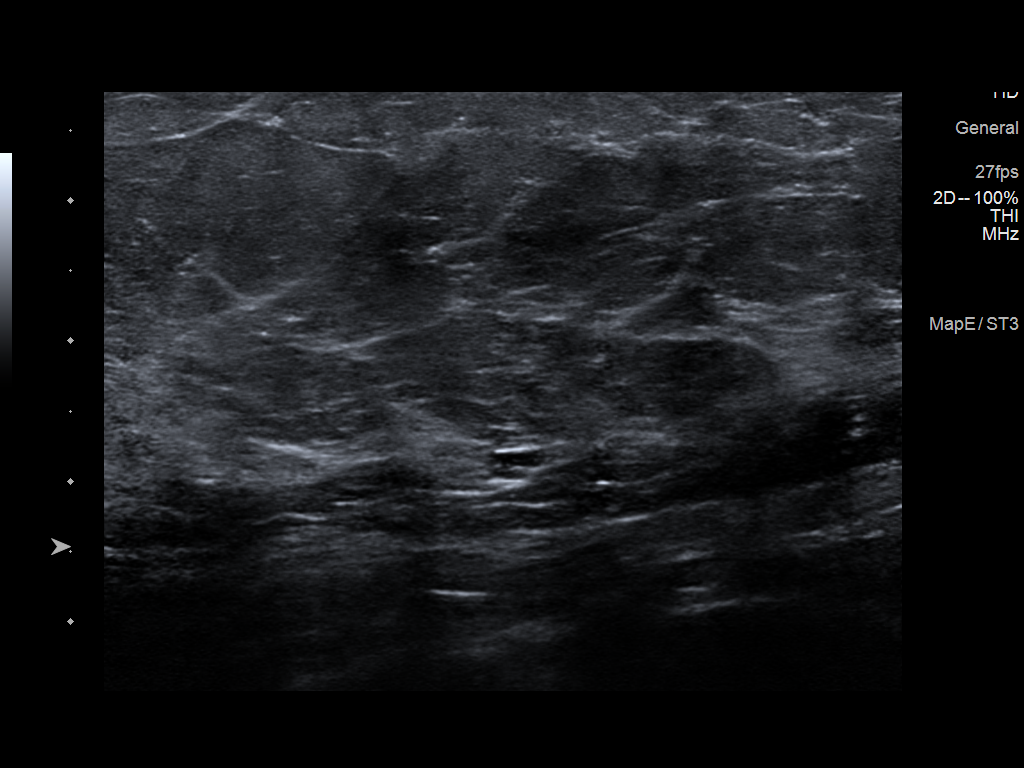
[im 4/7]
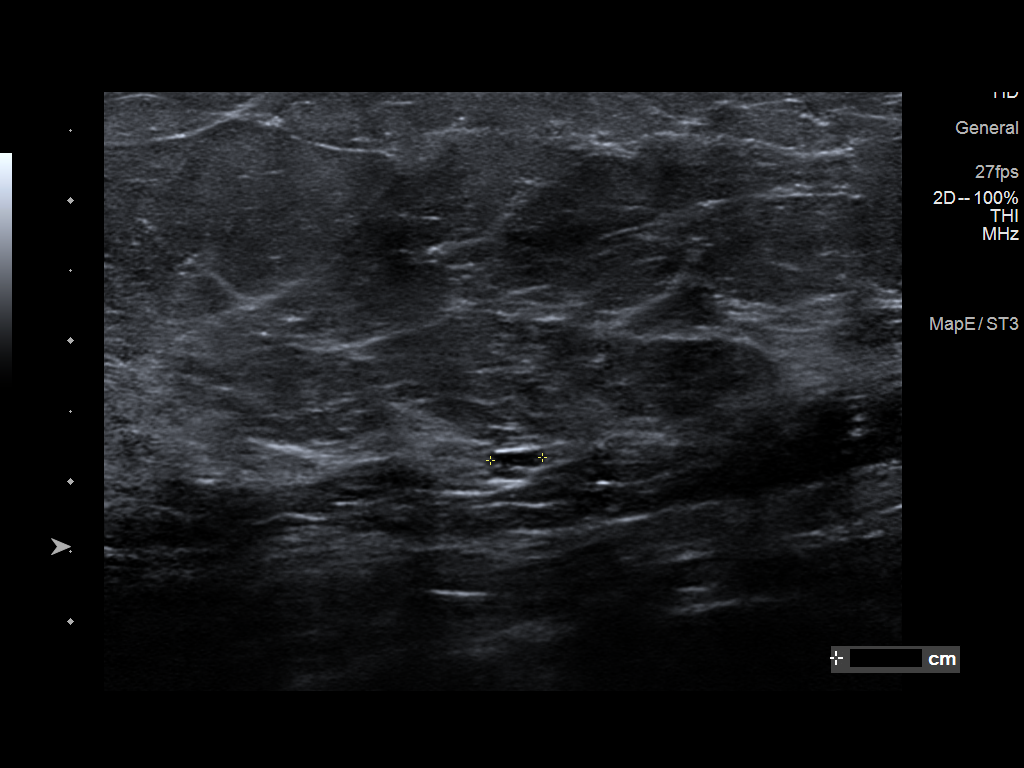
[im 5/7]
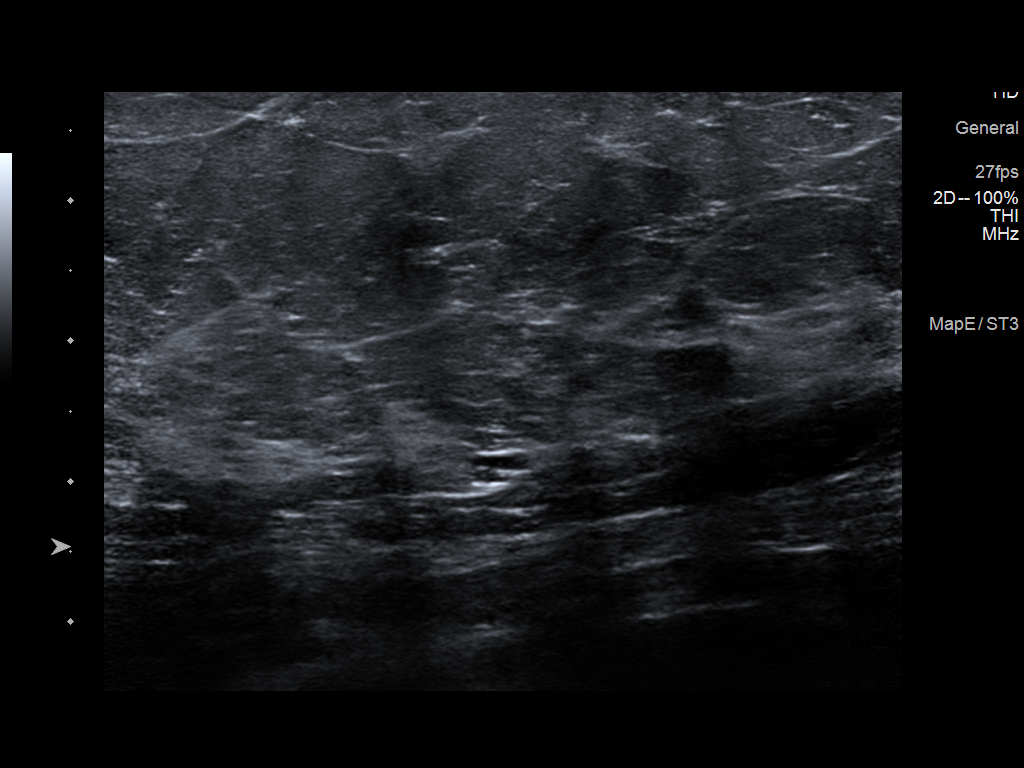
[im 6/7]
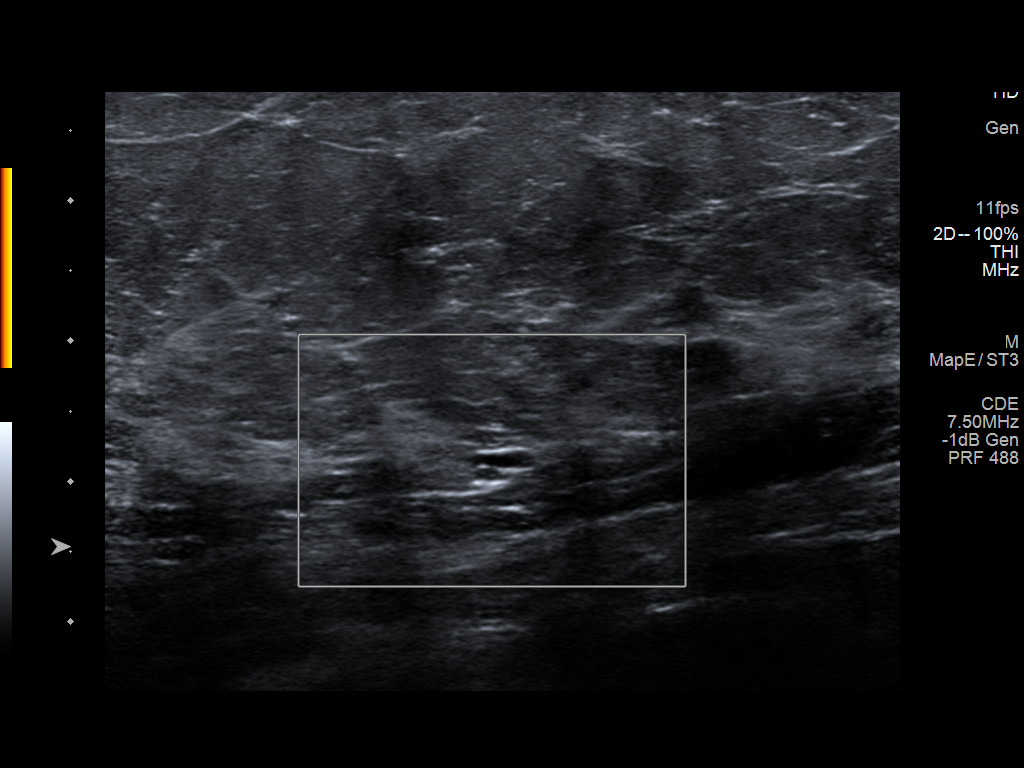
[im 7/7]
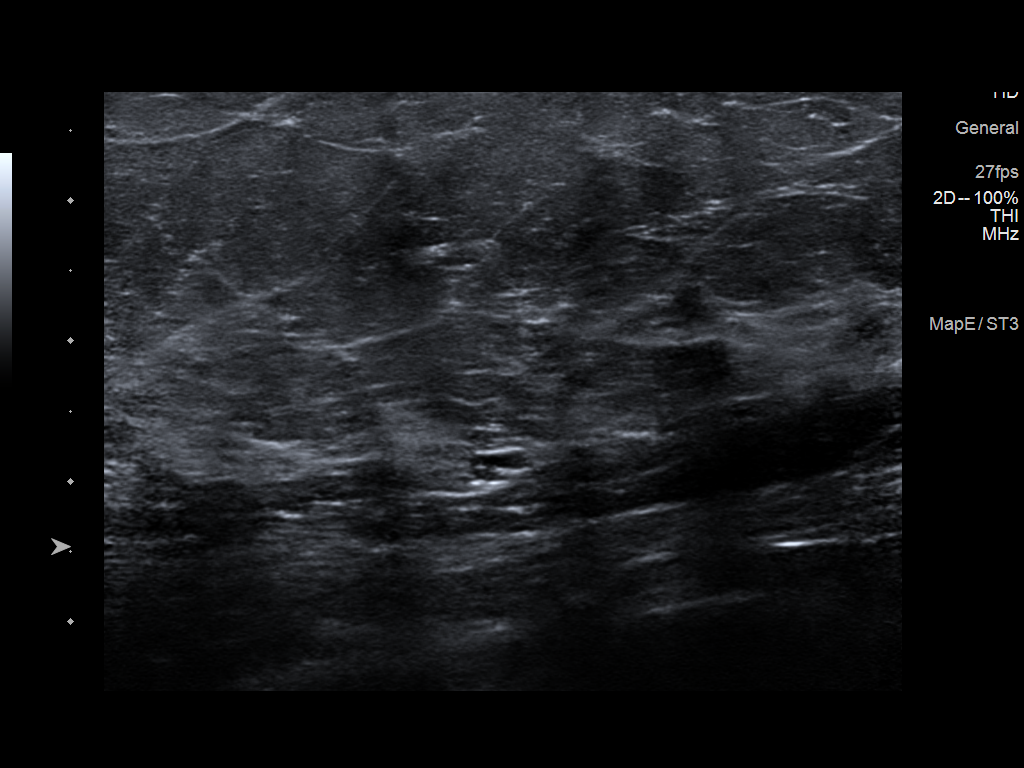

[7 of 7 positions shown; findings below may reference images not displayed]

ACR Breast Density Category b: There are scattered areas of
fibroglandular density.
FINDINGS: The previously noted possible mass persists on additional views as a
0.6 cm oval circumscribed mass in the slightly inner central breast
at middle to posterior depth.

Targeted ultrasound of the retroareolar left breast at the 9 o'clock
position demonstrates a 0.5 x 0.4 x 0.2 cm bilobed oval anechoic
circumscribed cyst with a single thin septation, corresponding with
the mammographic finding.
IMPRESSION: A 0.5 cm benign cyst at the retroareolar left breast 9 o'clock
position accounts for the finding recent screening mammogram.

RECOMMENDATION:
Annual screening mammogram.

I have discussed the findings and recommendations with the patient.
If applicable, a reminder letter will be sent to the patient
regarding the next appointment.

BI-RADS CATEGORY  2: Benign.

## 2021-01-18 IMAGING — MG MM DIGITAL DIAGNOSTIC UNILAT*L* W/ TOMO W/ CAD
8 series · 8 of 24 positions shown · non-contrast
Comparison: Previous exam(s).

CLINICAL DATA: Recall for possible mass in the left breast.

EXAM:
DIGITAL DIAGNOSTIC UNILATERAL LEFT MAMMOGRAM WITH TOMOSYNTHESIS AND
CAD; ULTRASOUND LEFT BREAST LIMITED
TECHNIQUE: Left digital diagnostic mammography and breast tomosynthesis was
performed. The images were evaluated with computer-aided detection.;
Targeted ultrasound examination of the left breast was performed.

[L ML synth-2D (1 of 2)]
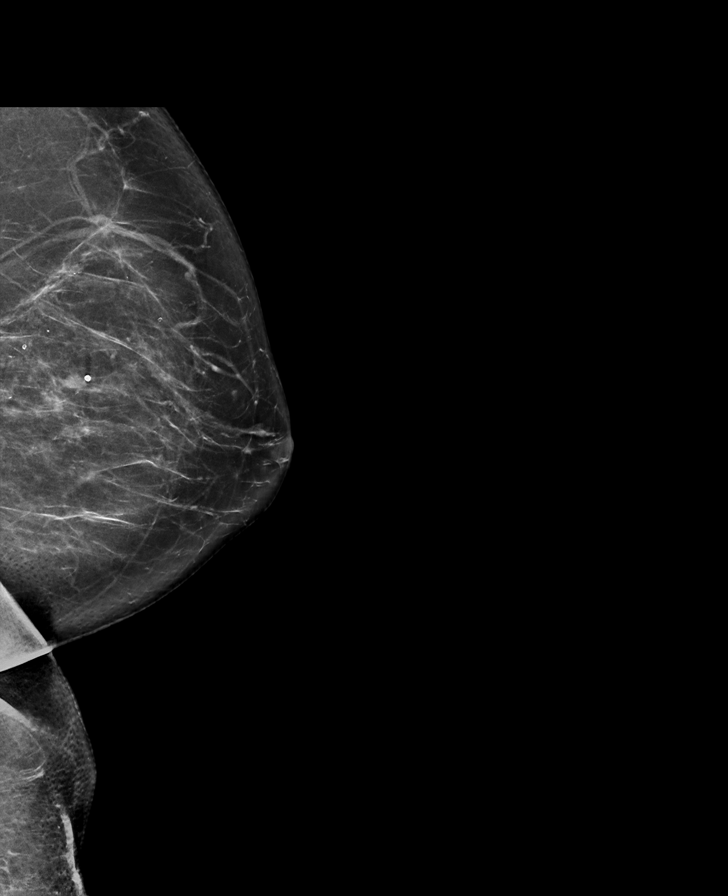

[L ML synth-2D (2 of 2)]
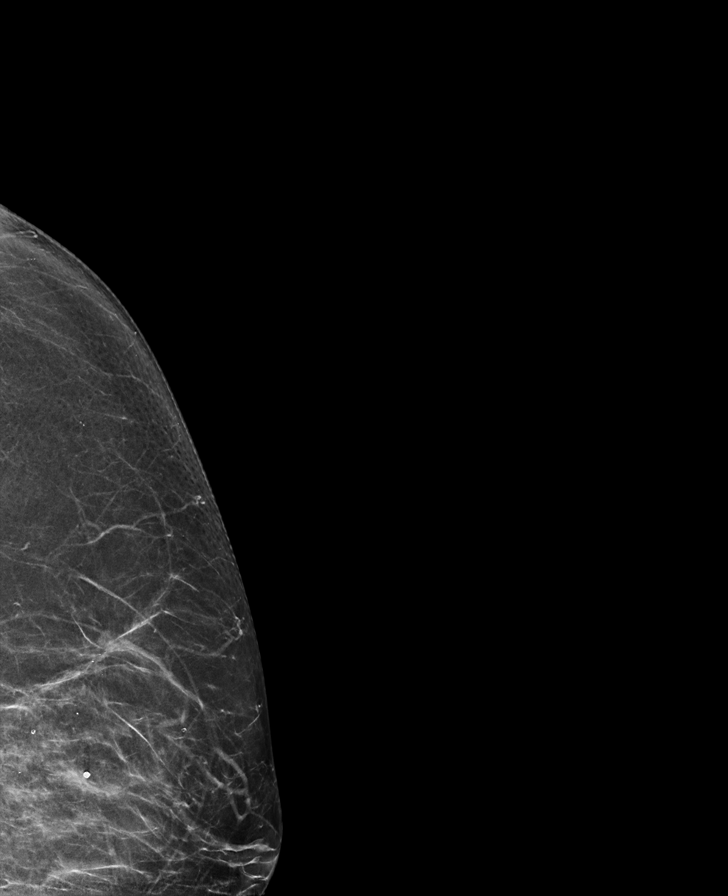

[L MLO synth-2D]
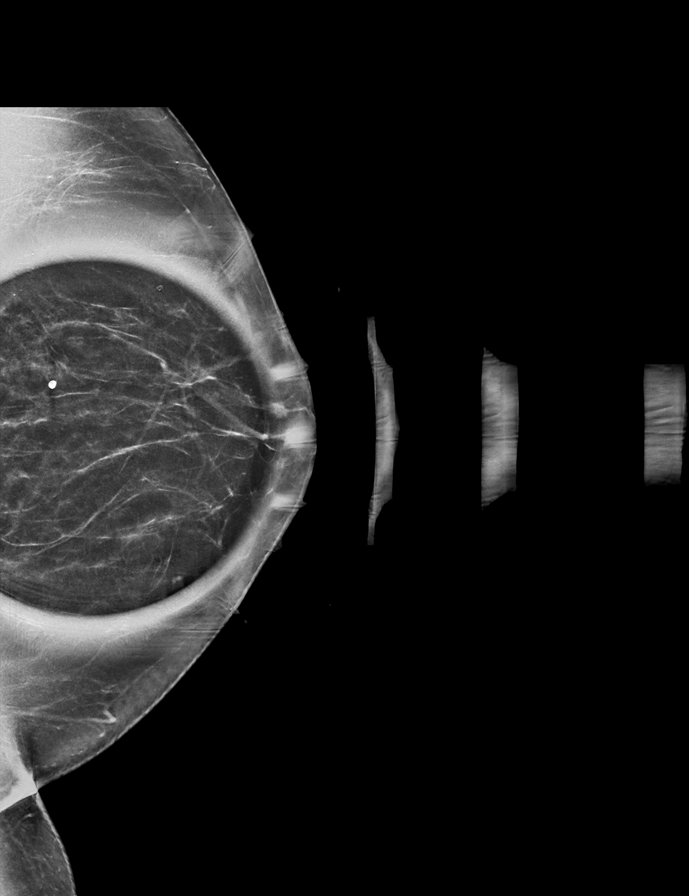

[L CC synth-2D]
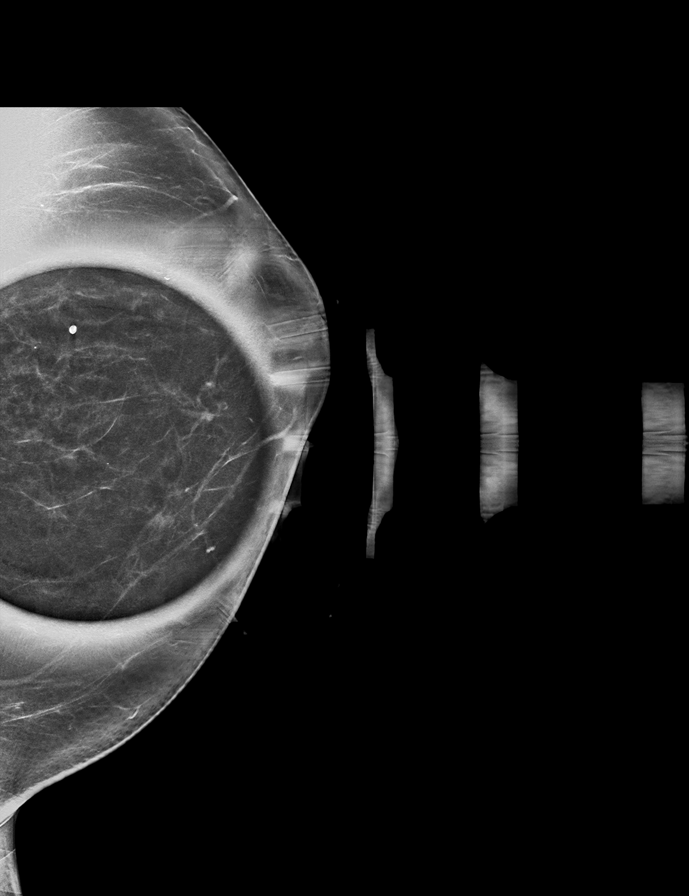

[L ML tomo (1 of 2) · tomo slice 39/78.0]
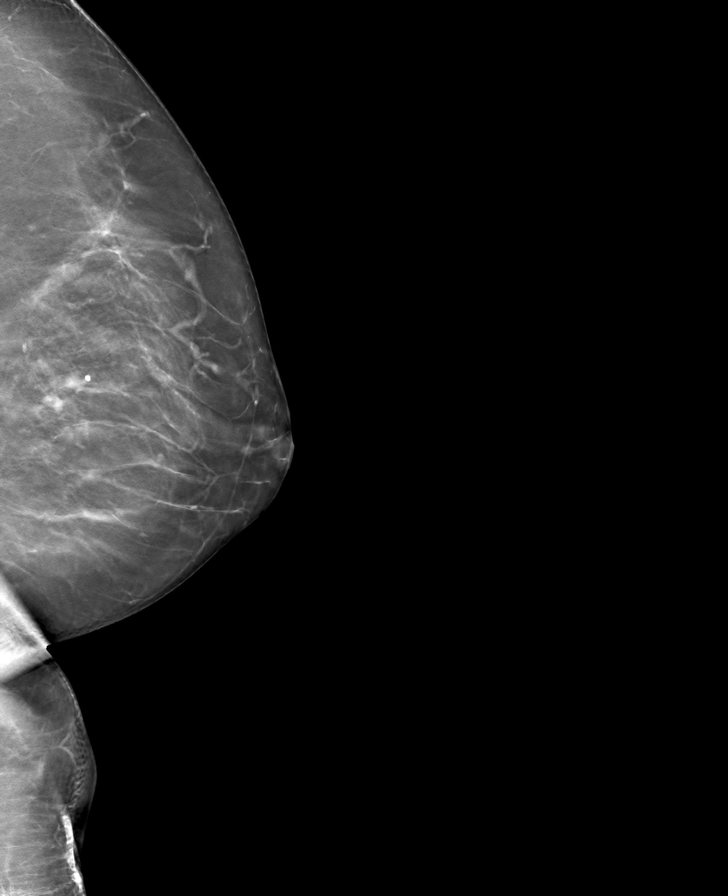

[L MLO tomo · tomo slice 33/66.0]
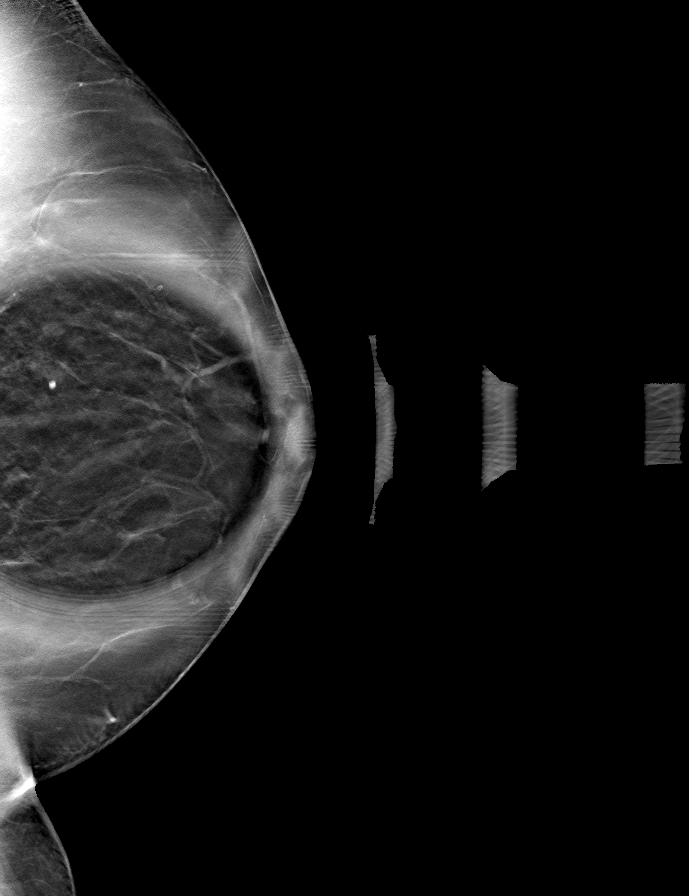

[L ML tomo (2 of 2) · tomo slice 35/69.0]
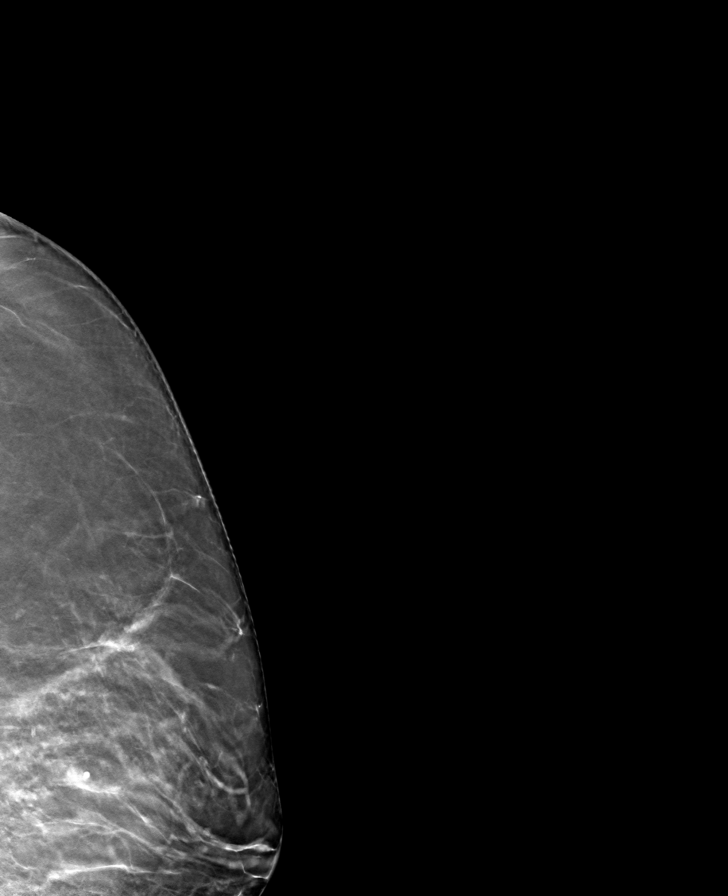

[L CC tomo · tomo slice 33/66.0]
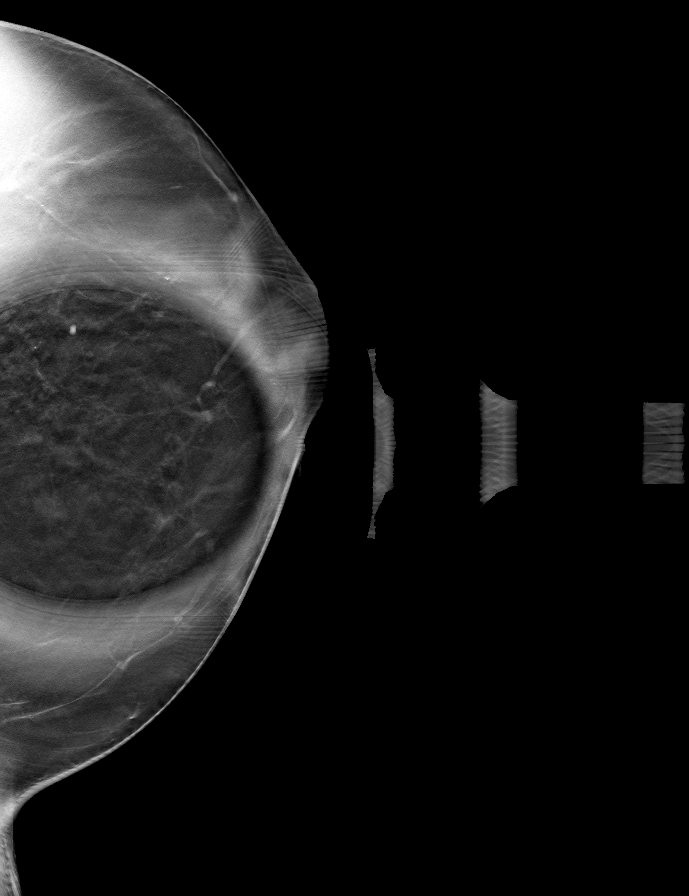

[8 of 24 positions shown; findings below may reference images not displayed]

ACR Breast Density Category b: There are scattered areas of
fibroglandular density.
FINDINGS: The previously noted possible mass persists on additional views as a
0.6 cm oval circumscribed mass in the slightly inner central breast
at middle to posterior depth.

Targeted ultrasound of the retroareolar left breast at the 9 o'clock
position demonstrates a 0.5 x 0.4 x 0.2 cm bilobed oval anechoic
circumscribed cyst with a single thin septation, corresponding with
the mammographic finding.
IMPRESSION: A 0.5 cm benign cyst at the retroareolar left breast 9 o'clock
position accounts for the finding recent screening mammogram.

RECOMMENDATION:
Annual screening mammogram.

I have discussed the findings and recommendations with the patient.
If applicable, a reminder letter will be sent to the patient
regarding the next appointment.

BI-RADS CATEGORY  2: Benign.

## 2021-01-20 ENCOUNTER — Other Ambulatory Visit: Payer: Self-pay

## 2021-01-20 ENCOUNTER — Encounter: Payer: Self-pay | Admitting: Nurse Practitioner

## 2021-01-20 ENCOUNTER — Ambulatory Visit (INDEPENDENT_AMBULATORY_CARE_PROVIDER_SITE_OTHER): Payer: Medicare HMO | Admitting: Nurse Practitioner

## 2021-01-20 VITALS — BP 140/78 | HR 86 | Temp 98.2°F | Ht 65.0 in | Wt 178.0 lb

## 2021-01-20 DIAGNOSIS — E663 Overweight: Secondary | ICD-10-CM

## 2021-01-20 DIAGNOSIS — Z1329 Encounter for screening for other suspected endocrine disorder: Secondary | ICD-10-CM | POA: Diagnosis not present

## 2021-01-20 DIAGNOSIS — Z13220 Encounter for screening for lipoid disorders: Secondary | ICD-10-CM | POA: Diagnosis not present

## 2021-01-20 DIAGNOSIS — Z131 Encounter for screening for diabetes mellitus: Secondary | ICD-10-CM

## 2021-01-20 DIAGNOSIS — M8580 Other specified disorders of bone density and structure, unspecified site: Secondary | ICD-10-CM | POA: Diagnosis not present

## 2021-01-20 LAB — CBC WITH DIFFERENTIAL/PLATELET
Basophils Absolute: 0 10*3/uL (ref 0.0–0.1)
Basophils Relative: 0.4 % (ref 0.0–3.0)
Eosinophils Absolute: 0.1 10*3/uL (ref 0.0–0.7)
Eosinophils Relative: 1.4 % (ref 0.0–5.0)
HCT: 43.5 % (ref 36.0–46.0)
Hemoglobin: 14.2 g/dL (ref 12.0–15.0)
Lymphocytes Relative: 40.9 % (ref 12.0–46.0)
Lymphs Abs: 3.6 10*3/uL (ref 0.7–4.0)
MCHC: 32.7 g/dL (ref 30.0–36.0)
MCV: 89.1 fl (ref 78.0–100.0)
Monocytes Absolute: 0.5 10*3/uL (ref 0.1–1.0)
Monocytes Relative: 6.1 % (ref 3.0–12.0)
Neutro Abs: 4.5 10*3/uL (ref 1.4–7.7)
Neutrophils Relative %: 51.2 % (ref 43.0–77.0)
Platelets: 266 10*3/uL (ref 150.0–400.0)
RBC: 4.88 Mil/uL (ref 3.87–5.11)
RDW: 13 % (ref 11.5–15.5)
WBC: 8.8 10*3/uL (ref 4.0–10.5)

## 2021-01-20 LAB — VITAMIN D 25 HYDROXY (VIT D DEFICIENCY, FRACTURES): VITD: 20.26 ng/mL — ABNORMAL LOW (ref 30.00–100.00)

## 2021-01-20 LAB — LIPID PANEL
Cholesterol: 215 mg/dL — ABNORMAL HIGH (ref 0–200)
HDL: 53 mg/dL (ref >39.00)
LDL Cholesterol: 135 mg/dL — ABNORMAL HIGH (ref 0–99)
NonHDL: 161.97
Total CHOL/HDL Ratio: 4
Triglycerides: 137 mg/dL (ref 0.0–149.0)
VLDL: 27.4 mg/dL (ref 0.0–40.0)

## 2021-01-20 LAB — COMPREHENSIVE METABOLIC PANEL
ALT: 23 U/L (ref 0–35)
AST: 17 U/L (ref 0–37)
Albumin: 4.6 g/dL (ref 3.5–5.2)
Alkaline Phosphatase: 73 U/L (ref 39–117)
BUN: 14 mg/dL (ref 6–23)
CO2: 27 mEq/L (ref 19–32)
Calcium: 9.6 mg/dL (ref 8.4–10.5)
Chloride: 102 mEq/L (ref 96–112)
Creatinine, Ser: 0.79 mg/dL (ref 0.40–1.20)
GFR: 77.95 mL/min (ref >60.00)
Glucose, Bld: 88 mg/dL (ref 70–99)
Potassium: 3.7 mEq/L (ref 3.5–5.1)
Sodium: 140 mEq/L (ref 135–145)
Total Bilirubin: 0.4 mg/dL (ref 0.2–1.2)
Total Protein: 8 g/dL (ref 6.0–8.3)

## 2021-01-20 LAB — HEMOGLOBIN A1C: Hgb A1c MFr Bld: 5.8 % (ref 4.6–6.5)

## 2021-01-20 LAB — TSH: TSH: 1.69 u[IU]/mL (ref 0.35–5.50)

## 2021-01-20 NOTE — Progress Notes (Signed)
Subjective:  Patient ID: Penny Gardner, female    DOB: 10-20-1955  Age: 66 y.o. MRN: [id number]  CC:  Chief Complaint  Patient presents with   Transitions Of Care      HPI  This patient arrives today for the above.  She is here to establish care at this office.  She does not have any acute complaints but does have some questions.  She tells me she is going to be having rotator cuff surgery soon and is wondering if she takes vitamin A, vitamin D, calcium, and vitamin B12 but this would interfere with her surgery or healing from her surgery.  She also is going to be a grandmother and was told she should get an updated Tdap and wants to know if this recommendation is accurate.  Patient reports that her last nuance visit was completed in June 2022.  Past Medical History:  Diagnosis Date   Benign neoplasm of colon    Conjunctival hemorrhage    Diverticulosis of colon (without mention of hemorrhage)    colonoscopy 8/02, 03/11/07 abnormal   Esophageal reflux    EGD positive 10/06   Hiatal hernia    History of cesarean section    1986 and 1988   History of ethmoidectomy 1989   Impacted cerumen    Papanicolaou smear of cervix with atypical squamous cells of undetermined significance (ASC-US)    colposcopy 3/07, ASCUS   Plantar fascial fibromatosis    Postmenopausal HRT (hormone replacement therapy)    Routine general medical examination at a health care facility    Stricture and stenosis of esophagus       Family History  Problem Relation Age of Onset   Cancer Mother        colon   Heart attack Mother 25   Suicidality Father    Diabetes Father    Cancer Maternal Uncle        colon   Stroke Maternal Grandmother    Diabetes Paternal Grandmother     Social History   Social History Narrative   Diet:      Caffeine: yes      Married, if yes what year: yes.1980      Do you live in a house, apartment, assisted living, condo, trailer, ect: house      Is it one  or more stories: none      How many persons live in your home? 2      Pets: no      Highest level or education completed: Master of social work      IT sales professional profession: Educational psychologist      Exercise: yes                 Type and how often: treadmill and [employer] fitness videos         Living Will: Yes   DNR: No   POA/HPOA: No      Functional Status:   Do you have difficulty bathing or dressing yourself? No   Do you have difficulty preparing food or eating? No   Do you have difficulty managing your medications? No   Do you have difficulty managing your finances? No   Do you have difficulty affording your medications? NO   0   Social History   Tobacco Use   Smoking status: Never   Smokeless tobacco: Never  Substance Use Topics   Alcohol use: Yes    Alcohol/week: 5.0  standard drinks    Types: 5 Standard drinks or equivalent per week     Current Meds  Medication Sig   acetaminophen (TYLENOL) 500 MG tablet Take 500 mg by mouth as needed.   Ibuprofen 200 MG CAPS Take 400 mg by mouth every 5 (five) hours as needed.   loratadine (CLARITIN) 10 MG tablet Take 10 mg by mouth daily as needed for allergies.   omeprazole (PRILOSEC) 20 MG capsule Take 1 capsule (20 mg total) by mouth daily.   polyethylene glycol (MIRALAX / GLYCOLAX) 17 g packet Take 17 g by mouth daily as needed.   Probiotic Product (PROBIOTIC ADVANCED) CAPS Take 1 capsule by mouth daily.    ROS:  Review of Systems  Constitutional:  Negative for fever and weight loss.  Respiratory:  Negative for shortness of breath.   Cardiovascular:  Negative for chest pain.  Gastrointestinal:  Negative for blood in stool.  Neurological:  Negative for dizziness and headaches.    Objective:   Today's Vitals: BP 140/78 (BP Location: Left Arm, Patient Position: Sitting, Cuff Size: Normal)    Pulse 86    Temp 98.2 F (36.8 C) (Oral)    Ht 5\' 5"  (1.651 m)    Wt 178 lb (80.7 kg)    SpO2 99%    BMI 29.62  kg/m  Vitals with BMI 01/20/2021 08/03/2020 07/22/2020  Height 5\' 5"  - 5\' 5"   Weight 178 lbs 173 lbs 175 lbs 8 oz  BMI 29.62 16.10 96.0  Systolic 454 098 119  Diastolic 78 87 82  Pulse 86 108 97     Physical Exam Vitals reviewed.  Constitutional:      General: She is not in acute distress.    Appearance: Normal appearance.  HENT:     Head: Normocephalic and atraumatic.  Neck:     Vascular: No carotid bruit.  Cardiovascular:     Rate and Rhythm: Normal rate and regular rhythm.     Pulses: Normal pulses.     Heart sounds: Normal heart sounds.  Pulmonary:     Effort: Pulmonary effort is normal.     Breath sounds: Normal breath sounds.  Skin:    General: Skin is warm and dry.  Neurological:     General: No focal deficit present.     Mental Status: She is alert and oriented to person, place, and time.  Psychiatric:        Mood and Affect: Mood normal.        Behavior: Behavior normal.        Judgment: Judgment normal.         Assessment and Plan   1. Lipid screening   2. Thyroid disorder screening   3. Diabetes mellitus screening   4. Overweight   5. Osteopenia, unspecified location      Plan: 1.-5.  We will collect blood work for further evaluation today.  I recommended she discuss with her surgeon regarding the vitamins but as far as I know there is no contraindication to surgery of taking those supplements.  I also advised her that getting an updated Tdap is recommended if she is going to be around an infant.  I also recommended she make sure she is up-to-date with flu and pneumonia and COVID vaccines.  She tells me she is up-to-date with flu and pneumonia vaccine, last COVID-vaccine was administered in November 2021.  She tells me she had COVID-19 infection this past September.  She is wondering when she should get  the COVID-vaccine again, and I encouraged her to consider getting the BiValent vaccine at least 2 weeks before the baby is born.  She reports  understanding.   Tests ordered Orders Placed This Encounter  Procedures   TSH   Hemoglobin A1c   Lipid panel   Comprehensive metabolic panel   CBC with Differential/Platelet   VITAMIN D 25 Hydroxy (Vit-D Deficiency, Fractures)      No orders of the defined types were placed in this encounter.   Patient to follow-up in 1 to 3 months or sooner as needed.  Ailene Ards, NP

## 2021-01-25 DIAGNOSIS — K648 Other hemorrhoids: Secondary | ICD-10-CM | POA: Diagnosis not present

## 2021-01-25 DIAGNOSIS — Z86010 Personal history of colonic polyps: Secondary | ICD-10-CM | POA: Diagnosis not present

## 2021-01-25 DIAGNOSIS — Z800 Family history of malignant neoplasm of digestive organs: Secondary | ICD-10-CM | POA: Diagnosis not present

## 2021-01-26 ENCOUNTER — Encounter: Payer: Self-pay | Admitting: Orthopaedic Surgery

## 2021-02-07 DIAGNOSIS — Z800 Family history of malignant neoplasm of digestive organs: Secondary | ICD-10-CM | POA: Diagnosis not present

## 2021-02-07 DIAGNOSIS — Z86010 Personal history of colonic polyps: Secondary | ICD-10-CM | POA: Diagnosis not present

## 2021-02-07 DIAGNOSIS — K625 Hemorrhage of anus and rectum: Secondary | ICD-10-CM | POA: Diagnosis not present

## 2021-02-07 DIAGNOSIS — K5730 Diverticulosis of large intestine without perforation or abscess without bleeding: Secondary | ICD-10-CM | POA: Diagnosis not present

## 2021-02-07 DIAGNOSIS — K6389 Other specified diseases of intestine: Secondary | ICD-10-CM | POA: Diagnosis not present

## 2021-02-07 DIAGNOSIS — K626 Ulcer of anus and rectum: Secondary | ICD-10-CM | POA: Diagnosis not present

## 2021-02-07 DIAGNOSIS — K648 Other hemorrhoids: Secondary | ICD-10-CM | POA: Diagnosis not present

## 2021-02-07 DIAGNOSIS — Z1211 Encounter for screening for malignant neoplasm of colon: Secondary | ICD-10-CM | POA: Diagnosis not present

## 2021-02-08 ENCOUNTER — Other Ambulatory Visit: Payer: Medicare HMO

## 2021-02-09 ENCOUNTER — Other Ambulatory Visit: Payer: Self-pay | Admitting: Orthopaedic Surgery

## 2021-02-09 DIAGNOSIS — M24112 Other articular cartilage disorders, left shoulder: Secondary | ICD-10-CM | POA: Diagnosis not present

## 2021-02-09 DIAGNOSIS — G8918 Other acute postprocedural pain: Secondary | ICD-10-CM | POA: Diagnosis not present

## 2021-02-09 DIAGNOSIS — M75112 Incomplete rotator cuff tear or rupture of left shoulder, not specified as traumatic: Secondary | ICD-10-CM | POA: Diagnosis not present

## 2021-02-09 DIAGNOSIS — M7542 Impingement syndrome of left shoulder: Secondary | ICD-10-CM | POA: Diagnosis not present

## 2021-02-09 DIAGNOSIS — M7552 Bursitis of left shoulder: Secondary | ICD-10-CM | POA: Diagnosis not present

## 2021-02-09 DIAGNOSIS — M7582 Other shoulder lesions, left shoulder: Secondary | ICD-10-CM | POA: Diagnosis not present

## 2021-02-09 MED ORDER — OXYCODONE HCL 5 MG PO TABS: 5.0000 mg | ORAL_TABLET | ORAL | 0 refills | Status: DC | PRN

## 2021-02-16 ENCOUNTER — Ambulatory Visit (INDEPENDENT_AMBULATORY_CARE_PROVIDER_SITE_OTHER): Payer: Medicare HMO | Admitting: Physician Assistant

## 2021-02-16 ENCOUNTER — Encounter: Payer: Self-pay | Admitting: Physician Assistant

## 2021-02-16 ENCOUNTER — Other Ambulatory Visit: Payer: Self-pay

## 2021-02-16 DIAGNOSIS — Z9889 Other specified postprocedural states: Secondary | ICD-10-CM

## 2021-02-16 NOTE — Progress Notes (Signed)
HPI Mrs. Sarno returns today status post left shoulder arthroscopy.  This is a extensive debridement due to thick bursal tissue.  She is overall doing well.  She has been wearing her sling.  She is taking Tylenol and states she does not have much pain.  Physical exam: General well-developed well-nourished female no acute distress mood affect appropriate. Left shoulder port sites healing well no signs of infection.  She has forward flexion actively to approximately 90 degrees.  Limited range of motion of the shoulder secondary to discomfort.  Full range of motion of the elbow forearm and hand.  Impression 1 week status post left shoulder arthroscopy with extensive debridement  Plan: We will send her to outpatient physical therapy to work on range of motion strengthening shoulder.  Its scar tissue mobilization over the port sites encouraged.  Reviewed arthroscopy images with her.  Sutures were removed.  Questions encouraged and answered.

## 2021-02-22 DIAGNOSIS — M25531 Pain in right wrist: Secondary | ICD-10-CM | POA: Diagnosis not present

## 2021-02-22 DIAGNOSIS — M25512 Pain in left shoulder: Secondary | ICD-10-CM | POA: Diagnosis not present

## 2021-02-22 DIAGNOSIS — M25532 Pain in left wrist: Secondary | ICD-10-CM | POA: Diagnosis not present

## 2021-02-22 DIAGNOSIS — M545 Low back pain, unspecified: Secondary | ICD-10-CM | POA: Diagnosis not present

## 2021-02-24 DIAGNOSIS — M25512 Pain in left shoulder: Secondary | ICD-10-CM | POA: Diagnosis not present

## 2021-02-24 DIAGNOSIS — M25531 Pain in right wrist: Secondary | ICD-10-CM | POA: Diagnosis not present

## 2021-02-24 DIAGNOSIS — M545 Low back pain, unspecified: Secondary | ICD-10-CM | POA: Diagnosis not present

## 2021-02-24 DIAGNOSIS — M25532 Pain in left wrist: Secondary | ICD-10-CM | POA: Diagnosis not present

## 2021-03-01 DIAGNOSIS — M25512 Pain in left shoulder: Secondary | ICD-10-CM | POA: Diagnosis not present

## 2021-03-01 DIAGNOSIS — M25532 Pain in left wrist: Secondary | ICD-10-CM | POA: Diagnosis not present

## 2021-03-01 DIAGNOSIS — M545 Low back pain, unspecified: Secondary | ICD-10-CM | POA: Diagnosis not present

## 2021-03-01 DIAGNOSIS — M25531 Pain in right wrist: Secondary | ICD-10-CM | POA: Diagnosis not present

## 2021-03-03 DIAGNOSIS — M25512 Pain in left shoulder: Secondary | ICD-10-CM | POA: Diagnosis not present

## 2021-03-03 DIAGNOSIS — M25532 Pain in left wrist: Secondary | ICD-10-CM | POA: Diagnosis not present

## 2021-03-03 DIAGNOSIS — M545 Low back pain, unspecified: Secondary | ICD-10-CM | POA: Diagnosis not present

## 2021-03-03 DIAGNOSIS — M25531 Pain in right wrist: Secondary | ICD-10-CM | POA: Diagnosis not present

## 2021-03-08 DIAGNOSIS — M25531 Pain in right wrist: Secondary | ICD-10-CM | POA: Diagnosis not present

## 2021-03-08 DIAGNOSIS — M545 Low back pain, unspecified: Secondary | ICD-10-CM | POA: Diagnosis not present

## 2021-03-08 DIAGNOSIS — M25532 Pain in left wrist: Secondary | ICD-10-CM | POA: Diagnosis not present

## 2021-03-08 DIAGNOSIS — M25512 Pain in left shoulder: Secondary | ICD-10-CM | POA: Diagnosis not present

## 2021-03-10 DIAGNOSIS — M25532 Pain in left wrist: Secondary | ICD-10-CM | POA: Diagnosis not present

## 2021-03-10 DIAGNOSIS — M25512 Pain in left shoulder: Secondary | ICD-10-CM | POA: Diagnosis not present

## 2021-03-10 DIAGNOSIS — M545 Low back pain, unspecified: Secondary | ICD-10-CM | POA: Diagnosis not present

## 2021-03-10 DIAGNOSIS — M25531 Pain in right wrist: Secondary | ICD-10-CM | POA: Diagnosis not present

## 2021-03-14 DIAGNOSIS — L821 Other seborrheic keratosis: Secondary | ICD-10-CM | POA: Diagnosis not present

## 2021-03-14 DIAGNOSIS — L718 Other rosacea: Secondary | ICD-10-CM | POA: Diagnosis not present

## 2021-03-14 DIAGNOSIS — L0232 Furuncle of buttock: Secondary | ICD-10-CM | POA: Diagnosis not present

## 2021-03-14 DIAGNOSIS — L814 Other melanin hyperpigmentation: Secondary | ICD-10-CM | POA: Diagnosis not present

## 2021-03-14 DIAGNOSIS — D225 Melanocytic nevi of trunk: Secondary | ICD-10-CM | POA: Diagnosis not present

## 2021-03-14 DIAGNOSIS — L218 Other seborrheic dermatitis: Secondary | ICD-10-CM | POA: Diagnosis not present

## 2021-03-14 DIAGNOSIS — L573 Poikiloderma of Civatte: Secondary | ICD-10-CM | POA: Diagnosis not present

## 2021-03-14 DIAGNOSIS — D485 Neoplasm of uncertain behavior of skin: Secondary | ICD-10-CM | POA: Diagnosis not present

## 2021-03-16 ENCOUNTER — Encounter: Payer: Self-pay | Admitting: Physician Assistant

## 2021-03-16 ENCOUNTER — Other Ambulatory Visit: Payer: Self-pay

## 2021-03-16 ENCOUNTER — Ambulatory Visit (INDEPENDENT_AMBULATORY_CARE_PROVIDER_SITE_OTHER): Payer: Medicare HMO | Admitting: Physician Assistant

## 2021-03-16 VITALS — Ht 65.0 in | Wt 172.4 lb

## 2021-03-16 DIAGNOSIS — M25512 Pain in left shoulder: Secondary | ICD-10-CM

## 2021-03-16 DIAGNOSIS — Z9889 Other specified postprocedural states: Secondary | ICD-10-CM | POA: Diagnosis not present

## 2021-03-16 MED ORDER — METHYLPREDNISOLONE ACETATE 40 MG/ML IJ SUSP
40.0000 mg | INTRAMUSCULAR | Status: AC | PRN
  Administered 2021-03-16: 14:00:00 40 mg via INTRA_ARTICULAR

## 2021-03-16 MED ORDER — LIDOCAINE HCL 1 % IJ SOLN
3.0000 mL | INTRAMUSCULAR | Status: AC | PRN
  Administered 2021-03-16: 14:00:00 3 mL

## 2021-03-16 NOTE — Progress Notes (Signed)
HPI: [name] returns today for follow-up of her left shoulder.  She underwent a left shoulder arthroscopy on 02/09/2021.  She was found to have thickened bursal tissue but no rotator cuff tear.  She is going to formal therapy.  She states she still has made some improvement that he still has no full overhead active motion.  Passively she can bring her left arm above her head using the right arm.  She uses ibuprofen and extra strength Tylenol which she states takes the edge off.  She denies any radicular symptoms down the arm. ? ?Physical exam: Left shoulder passively and bring her fully overhead.  Pain with impingement testing.  Otherwise fluid range of motion of the shoulder.  Port sites are well-healed. ? ?Impression: Status post left shoulder arthroscopy with extensive debridement ? ?Plan: She will continue work with outpatient physical therapy.  Offered her a subacromial injection and she was agreeable.  We will see if this helps with her range of motion and diminishing her pain.  She will follow-up with [place] in 1 month.  Questions were encouraged and answered ? ?Procedure Note ? ?Patient: Penny Gardner             ?Date of Birth: 10-07-1955           ?MRN: [id number]             ?Visit Date: 03/16/2021 ? ?Procedures: ?Visit Diagnoses: No diagnosis found. ? ?Large Joint Inj: L subacromial bursa on 03/16/2021 1:54 PM ?Indications: pain ?Details: 22 G 1.5 in needle, lateral approach ? ?Arthrogram: No ? ?Medications: 3 mL lidocaine 1 %; 40 mg methylPREDNISolone acetate 40 MG/ML ?Outcome: tolerated well, no immediate complications ?Procedure, treatment alternatives, risks and benefits explained, specific risks discussed. Consent was given by the patient. Immediately prior to procedure a time out was called to verify the correct patient, procedure, equipment, support staff and site/side marked as required. Patient was prepped and draped in the usual sterile fashion.  ? ? ? ? ? ?

## 2021-03-20 DIAGNOSIS — M25512 Pain in left shoulder: Secondary | ICD-10-CM | POA: Diagnosis not present

## 2021-03-20 DIAGNOSIS — M545 Low back pain, unspecified: Secondary | ICD-10-CM | POA: Diagnosis not present

## 2021-03-20 DIAGNOSIS — M25531 Pain in right wrist: Secondary | ICD-10-CM | POA: Diagnosis not present

## 2021-03-20 DIAGNOSIS — M25532 Pain in left wrist: Secondary | ICD-10-CM | POA: Diagnosis not present

## 2021-03-28 DIAGNOSIS — M25531 Pain in right wrist: Secondary | ICD-10-CM | POA: Diagnosis not present

## 2021-03-28 DIAGNOSIS — M25512 Pain in left shoulder: Secondary | ICD-10-CM | POA: Diagnosis not present

## 2021-03-28 DIAGNOSIS — M545 Low back pain, unspecified: Secondary | ICD-10-CM | POA: Diagnosis not present

## 2021-03-28 DIAGNOSIS — M25532 Pain in left wrist: Secondary | ICD-10-CM | POA: Diagnosis not present

## 2021-03-29 DIAGNOSIS — K648 Other hemorrhoids: Secondary | ICD-10-CM | POA: Diagnosis not present

## 2021-03-30 DIAGNOSIS — M25531 Pain in right wrist: Secondary | ICD-10-CM | POA: Diagnosis not present

## 2021-03-30 DIAGNOSIS — M25532 Pain in left wrist: Secondary | ICD-10-CM | POA: Diagnosis not present

## 2021-03-30 DIAGNOSIS — M25512 Pain in left shoulder: Secondary | ICD-10-CM | POA: Diagnosis not present

## 2021-03-30 DIAGNOSIS — M545 Low back pain, unspecified: Secondary | ICD-10-CM | POA: Diagnosis not present

## 2021-04-04 DIAGNOSIS — M25512 Pain in left shoulder: Secondary | ICD-10-CM | POA: Diagnosis not present

## 2021-04-04 DIAGNOSIS — M25531 Pain in right wrist: Secondary | ICD-10-CM | POA: Diagnosis not present

## 2021-04-04 DIAGNOSIS — M25532 Pain in left wrist: Secondary | ICD-10-CM | POA: Diagnosis not present

## 2021-04-04 DIAGNOSIS — M545 Low back pain, unspecified: Secondary | ICD-10-CM | POA: Diagnosis not present

## 2021-04-06 DIAGNOSIS — M25512 Pain in left shoulder: Secondary | ICD-10-CM | POA: Diagnosis not present

## 2021-04-06 DIAGNOSIS — M545 Low back pain, unspecified: Secondary | ICD-10-CM | POA: Diagnosis not present

## 2021-04-06 DIAGNOSIS — M25532 Pain in left wrist: Secondary | ICD-10-CM | POA: Diagnosis not present

## 2021-04-06 DIAGNOSIS — M25531 Pain in right wrist: Secondary | ICD-10-CM | POA: Diagnosis not present

## 2021-04-11 DIAGNOSIS — M25512 Pain in left shoulder: Secondary | ICD-10-CM | POA: Diagnosis not present

## 2021-04-11 DIAGNOSIS — M545 Low back pain, unspecified: Secondary | ICD-10-CM | POA: Diagnosis not present

## 2021-04-11 DIAGNOSIS — M25532 Pain in left wrist: Secondary | ICD-10-CM | POA: Diagnosis not present

## 2021-04-11 DIAGNOSIS — M25531 Pain in right wrist: Secondary | ICD-10-CM | POA: Diagnosis not present

## 2021-04-13 DIAGNOSIS — M25532 Pain in left wrist: Secondary | ICD-10-CM | POA: Diagnosis not present

## 2021-04-13 DIAGNOSIS — M545 Low back pain, unspecified: Secondary | ICD-10-CM | POA: Diagnosis not present

## 2021-04-13 DIAGNOSIS — M25531 Pain in right wrist: Secondary | ICD-10-CM | POA: Diagnosis not present

## 2021-04-13 DIAGNOSIS — M25512 Pain in left shoulder: Secondary | ICD-10-CM | POA: Diagnosis not present

## 2021-04-18 DIAGNOSIS — M545 Low back pain, unspecified: Secondary | ICD-10-CM | POA: Diagnosis not present

## 2021-04-18 DIAGNOSIS — M25512 Pain in left shoulder: Secondary | ICD-10-CM | POA: Diagnosis not present

## 2021-04-18 DIAGNOSIS — M25531 Pain in right wrist: Secondary | ICD-10-CM | POA: Diagnosis not present

## 2021-04-18 DIAGNOSIS — M25532 Pain in left wrist: Secondary | ICD-10-CM | POA: Diagnosis not present

## 2021-04-19 ENCOUNTER — Ambulatory Visit: Payer: Medicare HMO | Admitting: Physician Assistant

## 2021-04-19 ENCOUNTER — Encounter: Payer: Self-pay | Admitting: Physician Assistant

## 2021-04-19 DIAGNOSIS — Z9889 Other specified postprocedural states: Secondary | ICD-10-CM

## 2021-04-19 DIAGNOSIS — S46812A Strain of other muscles, fascia and tendons at shoulder and upper arm level, left arm, initial encounter: Secondary | ICD-10-CM

## 2021-04-19 MED ORDER — LIDOCAINE HCL 1 % IJ SOLN
1.0000 mL | INTRAMUSCULAR | Status: AC | PRN
  Administered 2021-04-19: 1 mL

## 2021-04-19 MED ORDER — METHYLPREDNISOLONE ACETATE 40 MG/ML IJ SUSP
40.0000 mg | INTRAMUSCULAR | Status: AC | PRN
  Administered 2021-04-19: 40 mg via INTRAMUSCULAR

## 2021-04-19 NOTE — Progress Notes (Signed)
HPI: [name] returns today for follow-up of her left shoulder.  She status post left shoulder arthroscopy with extensive debridement due to thickened bursal tissue.  No rotator cuff tear.  She is now just over 2 months status post surgery.  She is feels like she is improving but she still having pain mostly in her trapezius region and she brings with her a note from physical therapy that states that her range of motion and strength are progressing but has significant anterior shoulder pain and around the anterior deltoid and biceps tendon limiting her external rotation and internal rotation. ? ?Review of systems see HPI otherwise negative ? ?Physical exam: Left shoulder 5 out of 5 strength with external and internal rotation against resistance.  She has limited internal rotation left shoulder.  Port sites are all well-healed.  No evidence of biceps tendon rupture.  Full forward flexion left shoulder.  Tenderness in the left trapezius region. ? ?Impression: Status post left shoulder arthroscopy with extensive debridement ?Left trapezius tenderness ? ? ? ? ? ? ? ?Procedure Note ? ?Patient: Penny Gardner             ?Date of Birth: 1955/05/23           ?MRN: [id number]             ?Visit Date: 04/19/2021 ? ?Procedures: ?Visit Diagnoses:  ?1. S/P arthroscopy of left shoulder   ?2. Trapezius muscle strain, left, initial encounter   ? ? ?Trigger Point Inj ? ?Date/Time: 04/19/2021 5:12 PM ?Performed by: Pete Pelt, PA-C ?Authorized by: Pete Pelt, PA-C  ? ?Indications:  Pain and therapeutic ?Total # of Trigger Points:  1 ?Location: shoulder   ?Approach:  Dorsal ?Medications #1:  1 mL lidocaine 1 %; 40 mg methylPREDNISolone acetate 40 MG/ML ? ? ? ?Plan: Have her follow-up with [place] in 1 month.  Continue physical therapy.  Patient tolerated trigger point injection today without any adverse effects.  Questions were encouraged and answered by Dr. Ninfa Linden and myself ?

## 2021-04-26 ENCOUNTER — Other Ambulatory Visit: Payer: Self-pay | Admitting: Orthopaedic Surgery

## 2021-04-26 ENCOUNTER — Telehealth: Payer: Self-pay

## 2021-04-26 DIAGNOSIS — M25531 Pain in right wrist: Secondary | ICD-10-CM | POA: Diagnosis not present

## 2021-04-26 DIAGNOSIS — M25512 Pain in left shoulder: Secondary | ICD-10-CM | POA: Diagnosis not present

## 2021-04-26 DIAGNOSIS — M545 Low back pain, unspecified: Secondary | ICD-10-CM | POA: Diagnosis not present

## 2021-04-26 DIAGNOSIS — M25532 Pain in left wrist: Secondary | ICD-10-CM | POA: Diagnosis not present

## 2021-04-26 MED ORDER — DEXAMETHASONE 0.5 MG/5ML PO SOLN: 4.0000 mg | Freq: Every day | ORAL | 0 refills | Status: DC

## 2021-04-26 NOTE — Telephone Encounter (Signed)
Tried calling brad back but he was at lunch. Asked for him to return call to get this information.  ?

## 2021-04-26 NOTE — Telephone Encounter (Signed)
Sent to pharmacy 

## 2021-04-26 NOTE — Telephone Encounter (Signed)
Please advise 

## 2021-04-26 NOTE — Addendum Note (Signed)
Addended by: Robyne Peers on: 04/26/2021 04:54 PM ? ? Modules accepted: Orders ? ?

## 2021-04-26 NOTE — Telephone Encounter (Signed)
Penny Gardner is wanting to know if dexamethasone is able to be sent into patients pharmacy for anterior shoulder pain. ?

## 2021-04-26 NOTE — Telephone Encounter (Signed)
I talked to Penny Gardner. He needs it for iontophoresis. Send '4mg'$  per ml to [facility] ?

## 2021-05-02 ENCOUNTER — Other Ambulatory Visit: Payer: Self-pay

## 2021-05-02 DIAGNOSIS — M25532 Pain in left wrist: Secondary | ICD-10-CM | POA: Diagnosis not present

## 2021-05-02 DIAGNOSIS — M545 Low back pain, unspecified: Secondary | ICD-10-CM | POA: Diagnosis not present

## 2021-05-02 DIAGNOSIS — M25512 Pain in left shoulder: Secondary | ICD-10-CM | POA: Diagnosis not present

## 2021-05-02 DIAGNOSIS — M25531 Pain in right wrist: Secondary | ICD-10-CM | POA: Diagnosis not present

## 2021-05-02 MED ORDER — DEXAMETHASONE 0.5 MG/5ML PO SOLN: ORAL | 0 refills | Status: DC

## 2021-05-03 DIAGNOSIS — K648 Other hemorrhoids: Secondary | ICD-10-CM | POA: Diagnosis not present

## 2021-05-04 DIAGNOSIS — M25531 Pain in right wrist: Secondary | ICD-10-CM | POA: Diagnosis not present

## 2021-05-04 DIAGNOSIS — M25512 Pain in left shoulder: Secondary | ICD-10-CM | POA: Diagnosis not present

## 2021-05-04 DIAGNOSIS — M25532 Pain in left wrist: Secondary | ICD-10-CM | POA: Diagnosis not present

## 2021-05-04 DIAGNOSIS — M545 Low back pain, unspecified: Secondary | ICD-10-CM | POA: Diagnosis not present

## 2021-05-08 DIAGNOSIS — M545 Low back pain, unspecified: Secondary | ICD-10-CM | POA: Diagnosis not present

## 2021-05-08 DIAGNOSIS — M25532 Pain in left wrist: Secondary | ICD-10-CM | POA: Diagnosis not present

## 2021-05-08 DIAGNOSIS — M25531 Pain in right wrist: Secondary | ICD-10-CM | POA: Diagnosis not present

## 2021-05-08 DIAGNOSIS — M25512 Pain in left shoulder: Secondary | ICD-10-CM | POA: Diagnosis not present

## 2021-05-11 ENCOUNTER — Ambulatory Visit (INDEPENDENT_AMBULATORY_CARE_PROVIDER_SITE_OTHER): Payer: Medicare HMO | Admitting: Nurse Practitioner

## 2021-05-11 VITALS — BP 136/84 | HR 81 | Temp 98.6°F | Ht 65.0 in | Wt 172.0 lb

## 2021-05-11 DIAGNOSIS — L509 Urticaria, unspecified: Secondary | ICD-10-CM | POA: Diagnosis not present

## 2021-05-11 DIAGNOSIS — E785 Hyperlipidemia, unspecified: Secondary | ICD-10-CM

## 2021-05-11 DIAGNOSIS — E559 Vitamin D deficiency, unspecified: Secondary | ICD-10-CM

## 2021-05-11 DIAGNOSIS — B3789 Other sites of candidiasis: Secondary | ICD-10-CM

## 2021-05-11 DIAGNOSIS — M25512 Pain in left shoulder: Secondary | ICD-10-CM | POA: Diagnosis not present

## 2021-05-11 DIAGNOSIS — M545 Low back pain, unspecified: Secondary | ICD-10-CM | POA: Diagnosis not present

## 2021-05-11 DIAGNOSIS — M25531 Pain in right wrist: Secondary | ICD-10-CM | POA: Diagnosis not present

## 2021-05-11 DIAGNOSIS — M25532 Pain in left wrist: Secondary | ICD-10-CM | POA: Diagnosis not present

## 2021-05-11 MED ORDER — CLOTRIMAZOLE-BETAMETHASONE 1-0.05 % EX CREA: 1.0000 "application " | TOPICAL_CREAM | Freq: Two times a day (BID) | CUTANEOUS | 0 refills | Status: DC

## 2021-05-11 MED ORDER — HYDROXYZINE HCL 25 MG PO TABS: 25.0000 mg | ORAL_TABLET | Freq: Three times a day (TID) | ORAL | 1 refills | Status: DC | PRN

## 2021-05-11 NOTE — Assessment & Plan Note (Addendum)
Rash under left breast fold appears consistent with candidiasis.  Lotrisone cream ordered to apply twice a day for up to 14 days.  Patient counseled to notify me if rash persists or worsens. ?

## 2021-05-11 NOTE — Assessment & Plan Note (Signed)
Chronic, stable.  ASCVD risk were less than 10%.  For now patient will focus on lifestyle modification.  She has made changes to her diet.  She was encouraged to continue focusing on healthy lifestyle for now. ?

## 2021-05-11 NOTE — Assessment & Plan Note (Signed)
Patient encouraged to continue taking her vitamin D3 supplement.  We will order blood work, patient return to office between now and her next appointment did have serum vitamin D rechecked. ?

## 2021-05-11 NOTE — Assessment & Plan Note (Signed)
Acute, rash on neck does appear consistent with urticaria.  We will treat symptomatically for now.  She will continue taking her Claritin in the morning and I recommend either taking an over-the-counter Benadryl or prescribed hydroxyzine as needed at night.  She was told to use cold compress and to avoid scratching with fingernails.  She reports her understanding.  Recommend against using creams she has been prescribed by dermatology until we can determine etiology of rash.  She was counseled to call dermatology if rash persists another 3 weeks.  She reports her understanding.  She would like referral to a new dermatologist so referral ordered today. ?

## 2021-05-11 NOTE — Progress Notes (Signed)
? ?Acute Office Visit ? ?Subjective:  ? ?  ?Patient ID: Penny Gardner, female    DOB: [birth year], 66 y.o.   MRN: [id number] ? ?Chief Complaint  ?Patient presents with  ? Rash  ?  With itching on ears,neck and left breast  ? ? ?HPI ?Patient is in today for multiple pruritic rashes. ? ?She saw dermatology on 03/14/2021 at which point she was prescribed multiple creams for treatment of rosacea (prescribed adapalene 0.3% gel and metronidazole) and seborrheic dermatitis (prescribed Promiseb and Z-bar).  After using the Promiseb on her neck a pruritic, red rash erupted.  This has now been present for the last 2 to 3 weeks.  She has been applying hydrocortisone cream with some improvement.  She also noticed a round, itching rash under her left breast fold.  She been applying hydrocortisone cream to this as well with mild improvement.  She is also been experiencing itching in her ear canals with intermittent eruptions of red lesions.  She is concerned about the etiology and would like my recommendations regarding treatment. ? ?At her last office visit with me in January blood work was collected which showed hyperlipidemia as well as vitamin D deficiency.  Since then she has been taking vitamin D3 1000 IUs by mouth daily.  Her ASCVD risk score is approximately 7.3%. ? ?Review of Systems  ?Respiratory:  Positive for shortness of breath (with stairs, no SOB with walking on an incline).   ?Cardiovascular:  Negative for chest pain, orthopnea, leg swelling and PND.  ?Skin:  Positive for itching and rash.  ? ? ?   ?Objective:  ?  ?BP 136/84 (BP Location: Right Arm, Patient Position: Sitting, Cuff Size: Large)   Pulse 81   Temp 98.6 ?F (37 ?C) (Oral)   Ht '5\' 5"'$  (1.651 m)   Wt 172 lb (78 kg)   SpO2 98%   BMI 28.62 kg/m?  ?BP Readings from Last 3 Encounters:  ?05/11/21 136/84  ?01/20/21 140/78  ?08/03/20 (!) 150/87  ? ?  ? ?Physical Exam ?Vitals reviewed.  ?Constitutional:   ?   General: She is not in acute distress. ?    Appearance: Normal appearance.  ?HENT:  ?   Head: Normocephalic and atraumatic.  ?Neck:  ?   Vascular: No carotid bruit.  ?Cardiovascular:  ?   Rate and Rhythm: Normal rate and regular rhythm.  ?   Pulses: Normal pulses.  ?   Heart sounds: Normal heart sounds.  ?Pulmonary:  ?   Effort: Pulmonary effort is normal.  ?   Breath sounds: Normal breath sounds.  ?Skin: ?   General: Skin is warm and dry.  ? ?    ?Neurological:  ?   General: No focal deficit present.  ?   Mental Status: She is alert and oriented to person, place, and time.  ?Psychiatric:     ?   Mood and Affect: Mood normal.     ?   Behavior: Behavior normal.     ?   Judgment: Judgment normal.  ? ? ?No results found for any visits on 05/11/21. ? ? ?   ?Assessment & Plan:  ? ?Problem List Items Addressed This Visit   ? ?  ? Musculoskeletal and Integument  ? Urticaria - Primary  ?  Acute, rash on neck does appear consistent with urticaria.  We will treat symptomatically for now.  She will continue taking her Claritin in the morning and I recommend either taking an over-the-counter Benadryl or  prescribed hydroxyzine as needed at night.  She was told to use cold compress and to avoid scratching with fingernails.  She reports her understanding.  Recommend against using creams she has been prescribed by dermatology until we can determine etiology of rash.  She was counseled to call dermatology if rash persists another 3 weeks.  She reports her understanding.  She would like referral to a new dermatologist so referral ordered today. ? ?  ?  ? Relevant Medications  ? clotrimazole-betamethasone (LOTRISONE) cream  ? hydrOXYzine (ATARAX) 25 MG tablet  ? Other Relevant Orders  ? Ambulatory referral to Dermatology  ?  ? Other  ? HLD (hyperlipidemia)  ?  Chronic, stable.  ASCVD risk were less than 10%.  For now patient will focus on lifestyle modification.  She has made changes to her diet.  She was encouraged to continue focusing on healthy lifestyle for now. ? ?  ?  ?  Candidiasis of breast  ?  Rash under left breast fold appears consistent with candidiasis.  Lotrisone cream ordered to apply twice a day for up to 14 days.  Patient counseled to notify me if rash persists or worsens. ? ?  ?  ? Relevant Medications  ? clotrimazole-betamethasone (LOTRISONE) cream  ? Vitamin D deficiency  ?  Patient encouraged to continue taking her vitamin D3 supplement.  We will order blood work, patient return to office between now and her next appointment did have serum vitamin D rechecked. ? ?  ?  ? Relevant Orders  ? Vitamin D (25 hydroxy)  ? Basic Metabolic Panel (BMET)  ? ? ?Meds ordered this encounter  ?Medications  ? clotrimazole-betamethasone (LOTRISONE) cream  ?  Sig: Apply 1 application. topically 2 (two) times daily.  ?  Dispense:  30 g  ?  Refill:  0  ?  Order Specific Question:   Supervising Provider  ?  Answer:   Binnie Rail [[id number]]  ? hydrOXYzine (ATARAX) 25 MG tablet  ?  Sig: Take 1-2 tablets (25-50 mg total) by mouth every 8 (eight) hours as needed.  ?  Dispense:  30 tablet  ?  Refill:  1  ?  Order Specific Question:   Supervising Provider  ?  Answer:   Binnie Rail [[id number]]  ? ? ?Return in about 3 months (around 08/11/2021). ? ?Ailene Ards, NP ? ? ? ?

## 2021-05-17 ENCOUNTER — Ambulatory Visit: Payer: Medicare HMO | Admitting: Orthopaedic Surgery

## 2021-05-18 ENCOUNTER — Ambulatory Visit: Payer: Medicare HMO | Admitting: Nurse Practitioner

## 2021-05-18 DIAGNOSIS — M25512 Pain in left shoulder: Secondary | ICD-10-CM | POA: Diagnosis not present

## 2021-05-18 DIAGNOSIS — M25531 Pain in right wrist: Secondary | ICD-10-CM | POA: Diagnosis not present

## 2021-05-18 DIAGNOSIS — M545 Low back pain, unspecified: Secondary | ICD-10-CM | POA: Diagnosis not present

## 2021-05-18 DIAGNOSIS — M25532 Pain in left wrist: Secondary | ICD-10-CM | POA: Diagnosis not present

## 2021-05-19 DIAGNOSIS — M545 Low back pain, unspecified: Secondary | ICD-10-CM | POA: Diagnosis not present

## 2021-05-19 DIAGNOSIS — M25531 Pain in right wrist: Secondary | ICD-10-CM | POA: Diagnosis not present

## 2021-05-19 DIAGNOSIS — M25532 Pain in left wrist: Secondary | ICD-10-CM | POA: Diagnosis not present

## 2021-05-19 DIAGNOSIS — M25512 Pain in left shoulder: Secondary | ICD-10-CM | POA: Diagnosis not present

## 2021-05-22 DIAGNOSIS — M25512 Pain in left shoulder: Secondary | ICD-10-CM | POA: Diagnosis not present

## 2021-05-22 DIAGNOSIS — M25532 Pain in left wrist: Secondary | ICD-10-CM | POA: Diagnosis not present

## 2021-05-22 DIAGNOSIS — M25531 Pain in right wrist: Secondary | ICD-10-CM | POA: Diagnosis not present

## 2021-05-22 DIAGNOSIS — M545 Low back pain, unspecified: Secondary | ICD-10-CM | POA: Diagnosis not present

## 2021-06-19 ENCOUNTER — Encounter: Payer: Self-pay | Admitting: Physician Assistant

## 2021-06-19 ENCOUNTER — Ambulatory Visit: Payer: Medicare HMO | Admitting: Physician Assistant

## 2021-06-19 DIAGNOSIS — M25512 Pain in left shoulder: Secondary | ICD-10-CM

## 2021-06-19 DIAGNOSIS — G8929 Other chronic pain: Secondary | ICD-10-CM | POA: Diagnosis not present

## 2021-06-19 DIAGNOSIS — Z9889 Other specified postprocedural states: Secondary | ICD-10-CM | POA: Diagnosis not present

## 2021-06-19 NOTE — Addendum Note (Signed)
Addended by: Robyne Peers on: 06/19/2021 01:52 PM   Modules accepted: Orders

## 2021-06-19 NOTE — Progress Notes (Signed)
HPI: Mrs. Kendrick returns today status post left shoulder arthroscopy her debridement and partial acromioplasty without coracoacromial release.  She is now 4 months status post procedure.  She continues to have left shoulder pain.  She states pain is deep within the shoulder.  She notes that the trapezius trigger injection really "did not make a difference.  However she is having more anterior shoulder pain at this point in time.  She did go to formal therapy did not feel it was beneficial.  She denies any numbness tingling down either arm.  Review of systems: See HPI otherwise negative or noncontributory.  Physical exam: General well-developed well-nourished female no acute distress.  Mood and affect appropriate. Bilateral shoulder she has 5/5 strength with external and internal rotation against resistance however external rotation left shoulder against resistance causes pain.  Negative impingement testing.  She has full forward flexion bilateral shoulders without pain.  Slightly diminished internal rotation of the left shoulder compared to the right.  Tenderness left shoulder bicipital groove region.  Bicep strength 5 out of 5.  Distal biceps nontender.  Impression: Left shoulder pain status post shoulder arthroscopy with subacromial decompression and partial acromioplasty 02/09/2021.  Plan: We will send her for an intra-articular injection of her left shoulder.  She will follow-up with [place] 2 weeks after the injection see how she is doing.  Questions were encouraged and answered at length.

## 2021-07-03 ENCOUNTER — Ambulatory Visit: Payer: Self-pay

## 2021-07-03 ENCOUNTER — Ambulatory Visit: Payer: Medicare HMO | Admitting: Physical Medicine and Rehabilitation

## 2021-07-03 DIAGNOSIS — G8929 Other chronic pain: Secondary | ICD-10-CM | POA: Diagnosis not present

## 2021-07-03 DIAGNOSIS — M25512 Pain in left shoulder: Secondary | ICD-10-CM | POA: Diagnosis not present

## 2021-07-03 MED ORDER — BUPIVACAINE HCL 0.25 % IJ SOLN
5.0000 mL | INTRAMUSCULAR | Status: AC | PRN
  Administered 2021-07-03: 5 mL via INTRA_ARTICULAR

## 2021-07-03 MED ORDER — TRIAMCINOLONE ACETONIDE 40 MG/ML IJ SUSP
40.0000 mg | INTRAMUSCULAR | Status: AC | PRN
  Administered 2021-07-03: 40 mg via INTRA_ARTICULAR

## 2021-07-10 ENCOUNTER — Encounter: Payer: Medicare HMO | Admitting: Nurse Practitioner

## 2021-07-12 ENCOUNTER — Ambulatory Visit: Payer: Medicare HMO | Admitting: Nurse Practitioner

## 2021-07-17 ENCOUNTER — Encounter: Payer: Self-pay | Admitting: Orthopaedic Surgery

## 2021-07-17 ENCOUNTER — Ambulatory Visit (INDEPENDENT_AMBULATORY_CARE_PROVIDER_SITE_OTHER): Payer: Medicare HMO | Admitting: Orthopaedic Surgery

## 2021-07-17 DIAGNOSIS — G8929 Other chronic pain: Secondary | ICD-10-CM | POA: Diagnosis not present

## 2021-07-17 DIAGNOSIS — Z9889 Other specified postprocedural states: Secondary | ICD-10-CM | POA: Diagnosis not present

## 2021-07-17 DIAGNOSIS — M25512 Pain in left shoulder: Secondary | ICD-10-CM | POA: Diagnosis not present

## 2021-07-17 NOTE — Progress Notes (Signed)
The patient comes in today after having an intra-articular steroid injection in her left shoulder.  She reports that she is significantly better.  She is not 100% better but she has made great progress.  She is about 5 months out from a left shoulder arthroscopy with extensive debridement and subacromial decompression.  There was a partial-thickness rotator cuff tear.  She is very active 65 year old female.  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  She is very pleased overall.  Her left shoulder motion is almost entirely full.  She still has some limitations with internal rotation combined with adduction with reaching behind her but it is significantly improved.  At this point follow-up can be as needed since she is doing well.  If she starts to have any issues at all she knows to give [place] a call.

## 2021-08-14 ENCOUNTER — Other Ambulatory Visit (INDEPENDENT_AMBULATORY_CARE_PROVIDER_SITE_OTHER): Payer: Medicare HMO

## 2021-08-14 DIAGNOSIS — E559 Vitamin D deficiency, unspecified: Secondary | ICD-10-CM

## 2021-08-14 LAB — BASIC METABOLIC PANEL
BUN: 17 mg/dL (ref 6–23)
CO2: 25 mEq/L (ref 19–32)
Calcium: 9.4 mg/dL (ref 8.4–10.5)
Chloride: 104 mEq/L (ref 96–112)
Creatinine, Ser: 0.77 mg/dL (ref 0.40–1.20)
GFR: 80.07 mL/min (ref >60.00)
Glucose, Bld: 96 mg/dL (ref 70–99)
Potassium: 4.2 mEq/L (ref 3.5–5.1)
Sodium: 139 mEq/L (ref 135–145)

## 2021-08-14 LAB — VITAMIN D 25 HYDROXY (VIT D DEFICIENCY, FRACTURES): VITD: 21.67 ng/mL — ABNORMAL LOW (ref 30.00–100.00)

## 2021-08-17 ENCOUNTER — Ambulatory Visit (INDEPENDENT_AMBULATORY_CARE_PROVIDER_SITE_OTHER): Payer: Medicare HMO | Admitting: Nurse Practitioner

## 2021-08-17 VITALS — BP 124/76 | HR 81 | Temp 98.2°F | Ht 65.0 in | Wt 177.0 lb

## 2021-08-17 DIAGNOSIS — K219 Gastro-esophageal reflux disease without esophagitis: Secondary | ICD-10-CM | POA: Diagnosis not present

## 2021-08-17 DIAGNOSIS — E559 Vitamin D deficiency, unspecified: Secondary | ICD-10-CM

## 2021-08-17 DIAGNOSIS — B3789 Other sites of candidiasis: Secondary | ICD-10-CM | POA: Diagnosis not present

## 2021-08-17 DIAGNOSIS — Z0000 Encounter for general adult medical examination without abnormal findings: Secondary | ICD-10-CM

## 2021-08-17 MED ORDER — CLOTRIMAZOLE 1 % EX LOTN: 1.0000 | TOPICAL_LOTION | Freq: Two times a day (BID) | CUTANEOUS | 6 refills | Status: DC

## 2021-08-17 NOTE — Patient Instructions (Signed)
You should get EITHER PCV20 OR PPSV23 once you are home from beach

## 2021-08-17 NOTE — Assessment & Plan Note (Signed)
Recommend she try over-the-counter clotrimazole, will send prescription in case this is more financially affordable.  Told to apply twice a day , encouraged to keep the area under the breast dry is much as possible.  Encouraged to let me know if rash persists or worsens.  Patient reports understanding.

## 2021-08-17 NOTE — Assessment & Plan Note (Signed)
Recommend patient increase intake of vitamin D3 to 2000 IUs by mouth daily.  Will consider rechecking serum level at next office visit.  She reports that she would like to start a multivitamin in addition to her 1000 vitamin D3.  This should give her a total of 2000 IUs of vitamin D3 daily, I am agreeable to this.

## 2021-08-17 NOTE — Assessment & Plan Note (Signed)
Chronic, per shared decision making patient would like to trial a drug holiday.  She will taper off omeprazole, and notify me if symptoms return.  We discussed avoiding triggering foods such as spicy foods, caffeine, chocolate, mint, etc.  She was encouraged to use Tums or Pepcid on an as-needed basis.  If symptoms persist or worsen off Prilosec we can have shared decision-making discussion regarding risk versus benefits of restarting omeprazole.

## 2021-08-17 NOTE — Assessment & Plan Note (Signed)
Patient also asked about pneumonia vaccine.  She is already had PCV 13 approximately 1 year ago.  She would be due for either PCV 20 or PPSV23 at this time.  She is leaving for the beach today and would like to hold off on vaccine administration once she returns.  She was encouraged to call our office upon her return from vacation.

## 2021-08-17 NOTE — Progress Notes (Signed)
Established Patient Office Visit  Subjective   Patient ID: Penny Gardner, female    DOB: [birth year]  Age: 66 y.o. MRN: [id number]  Chief Complaint  Patient presents with   Follow-up    Vitamin D Def: Has been on 1,000IUs of vitamin D3 per day. Last serum showed vitamin d level of 21 (improved from 20).   GERD: has been on prilosec for "20 years" recently heard this may be associated with dementia.  She like to discuss possibly coming off of this.  She reports she has had endoscopy in the past which showed GERD but denies ever being told she had esophagitis.  Has history of esophageal stricture which was stretched.  Candidiasis of breast folds: She reports the Lotrisone cream helped her but the rash is returning and would like to have refill on this medication.    Review of Systems  Respiratory:  Positive for shortness of breath (when climbing stairs). Negative for cough and wheezing.   Cardiovascular:  Negative for chest pain.  Musculoskeletal:  Negative for falls.  Neurological:  Negative for dizziness and loss of consciousness.      Objective:     BP 124/76 (BP Location: Right Arm, Patient Position: Sitting, Cuff Size: Large)   Pulse 81   Temp 98.2 F (36.8 C) (Oral)   Ht '5\' 5"'$  (1.651 m)   Wt 177 lb (80.3 kg)   SpO2 99%   BMI 29.45 kg/m    Physical Exam Vitals reviewed.  Constitutional:      General: She is not in acute distress.    Appearance: Normal appearance.  HENT:     Head: Normocephalic and atraumatic.  Neck:     Vascular: No carotid bruit.  Cardiovascular:     Rate and Rhythm: Normal rate and regular rhythm.     Pulses: Normal pulses.     Heart sounds: Normal heart sounds.  Pulmonary:     Effort: Pulmonary effort is normal.     Breath sounds: Normal breath sounds.  Skin:    General: Skin is warm and dry.  Neurological:     General: No focal deficit present.     Mental Status: She is alert and oriented to person, place, and time.   Psychiatric:        Mood and Affect: Mood normal.        Behavior: Behavior normal.        Judgment: Judgment normal.      No results found for any visits on 08/17/21.    The 10-year ASCVD risk score (Arnett DK, et al., 2019) is: 6.1%    Assessment & Plan:   Problem List Items Addressed This Visit       Digestive   GERD    Chronic, per shared decision making patient would like to trial a drug holiday.  She will taper off omeprazole, and notify me if symptoms return.  We discussed avoiding triggering foods such as spicy foods, caffeine, chocolate, mint, etc.  She was encouraged to use Tums or Pepcid on an as-needed basis.  If symptoms persist or worsen off Prilosec we can have shared decision-making discussion regarding risk versus benefits of restarting omeprazole.        Other   Routine general medical examination at a health care facility    Patient also asked about pneumonia vaccine.  She is already had PCV 13 approximately 1 year ago.  She would be due for either PCV 20 or PPSV23 at this time.  She is leaving for the beach today and would like to hold off on vaccine administration once she returns.  She was encouraged to call our office upon her return from vacation.      Candidiasis of breast - Primary    Recommend she try over-the-counter clotrimazole, will send prescription in case this is more financially affordable.  Told to apply twice a day , encouraged to keep the area under the breast dry is much as possible.  Encouraged to let me know if rash persists or worsens.  Patient reports understanding.      Relevant Medications   Clotrimazole 1 % LOTN   Vitamin D deficiency    Recommend patient increase intake of vitamin D3 to 2000 IUs by mouth daily.  Will consider rechecking serum level at next office visit.  She reports that she would like to start a multivitamin in addition to her 1000 vitamin D3.  This should give her a total of 2000 IUs of vitamin D3 daily, I am  agreeable to this.       Return in about 3 months (around 11/17/2021) for F/U with Maggy Wyble.    Ailene Ards, NP

## 2021-09-28 ENCOUNTER — Telehealth: Payer: Self-pay | Admitting: Nurse Practitioner

## 2021-09-28 NOTE — Telephone Encounter (Signed)
LVM for pt to rtn my call to schedule AWV with [facility] call back # [phone] 

## 2021-10-09 DIAGNOSIS — Z23 Encounter for immunization: Secondary | ICD-10-CM | POA: Diagnosis not present

## 2021-11-16 ENCOUNTER — Ambulatory Visit: Payer: Medicare HMO | Admitting: Nurse Practitioner

## 2021-12-05 DIAGNOSIS — L718 Other rosacea: Secondary | ICD-10-CM | POA: Diagnosis not present

## 2021-12-05 DIAGNOSIS — D225 Melanocytic nevi of trunk: Secondary | ICD-10-CM | POA: Diagnosis not present

## 2021-12-05 DIAGNOSIS — L299 Pruritus, unspecified: Secondary | ICD-10-CM | POA: Diagnosis not present

## 2021-12-05 DIAGNOSIS — L503 Dermatographic urticaria: Secondary | ICD-10-CM | POA: Diagnosis not present

## 2021-12-05 DIAGNOSIS — L309 Dermatitis, unspecified: Secondary | ICD-10-CM | POA: Diagnosis not present

## 2022-01-17 ENCOUNTER — Ambulatory Visit: Payer: Medicare HMO | Admitting: Physician Assistant

## 2022-02-21 ENCOUNTER — Ambulatory Visit (INDEPENDENT_AMBULATORY_CARE_PROVIDER_SITE_OTHER): Payer: Medicare HMO

## 2022-02-21 VITALS — Ht 65.0 in | Wt 177.0 lb

## 2022-02-21 DIAGNOSIS — Z0000 Encounter for general adult medical examination without abnormal findings: Secondary | ICD-10-CM

## 2022-02-21 NOTE — Progress Notes (Signed)
Subjective:   Penny Gardner is a 67 y.o. female who presents for Medicare Annual (Subsequent) preventive examination.  I connected with  MAESA PAINE on 02/21/22 by a audio enabled telemedicine application and verified that I am speaking with the correct person using two identifiers.  Patient Location: Home  Provider Location: Home Office  I discussed the limitations of evaluation and management by telemedicine. The patient expressed understanding and agreed to proceed.  Review of Systems     Cardiac Risk Factors include: advanced age (>1mn, >>1women)     Objective:    Today's Vitals   02/21/22 1532  Weight: 177 lb (80.3 kg)  Height: 5' 5"$  (1.651 m)   Body mass index is 29.45 kg/m.     02/21/2022    3:37 PM 07/27/2020    1:53 PM 07/22/2020    1:36 PM 07/07/2020    9:23 AM 07/04/2020    1:29 PM  Advanced Directives  Does Patient Have a Medical Advance Directive? Yes No No No No  Type of Advance Directive Living will;Healthcare Power of Attorney      Does patient want to make changes to medical advance directive? No - Patient declined      [place] of [place]? No - copy requested      Would patient like information on creating a medical advance directive?  Yes (MAU/Ambulatory/Procedural Areas - Information given) Yes (MAU/Ambulatory/Procedural Areas - Information given) Yes (MAU/Ambulatory/Procedural Areas - Information given) Yes (MAU/Ambulatory/Procedural Areas - Information given)    Current Medications (verified) Outpatient Encounter Medications as of 02/21/2022  Medication Sig   acetaminophen (TYLENOL) 500 MG tablet Take 500 mg by mouth as needed.   Adapalene 0.3 % gel Apply topically.   Cholecalciferol 25 MCG (1000 UT) tablet Take 1,000 Units by mouth daily.   clindamycin (CLEOCIN T) 1 % external solution APPLY TO AFFECTED AREA TWICE A DAY   hydrOXYzine (ATARAX) 25 MG tablet Take 1-2 tablets (25-50 mg total) by mouth every 8  (eight) hours as needed.   Ibuprofen 200 MG CAPS Take 400 mg by mouth every 5 (five) hours as needed.   loratadine (CLARITIN) 10 MG tablet Take 10 mg by mouth daily as needed for allergies.   metroNIDAZOLE (METROGEL) 0.75 % gel SMARTSIG:sparingly Topical Daily   polyethylene glycol (MIRALAX / GLYCOLAX) 17 g packet Take 17 g by mouth daily as needed.   Probiotic Product (PROBIOTIC ADVANCED) CAPS Take 1 capsule by mouth daily.   [DISCONTINUED] Clotrimazole 1 % LOTN Apply 1 Application topically 2 (two) times daily.   No facility-administered encounter medications on file as of 02/21/2022.    Allergies (verified) Cefaclor   History: Past Medical History:  Diagnosis Date   Benign neoplasm of colon    Conjunctival hemorrhage    Diverticulosis of colon (without mention of hemorrhage)    colonoscopy 8/02, 03/11/07 abnormal   Esophageal reflux    EGD positive 10/06   Hiatal hernia    History of cesarean section    1986 and 1988   History of ethmoidectomy 1989   Impacted cerumen    Papanicolaou smear of cervix with atypical squamous cells of undetermined significance (ASC-US)    colposcopy 3/07, ASCUS   Plantar fascial fibromatosis    Postmenopausal HRT (hormone replacement therapy)    Routine general medical examination at a health care facility    Stricture and stenosis of esophagus    Past Surgical History:  Procedure Laterality Date  CATARACT EXTRACTION  2018   CESAREAN SECTION     1986 and 1988   ETHMOIDECTOMY  1989   GALLBLADDER SURGERY     Family History  Problem Relation Age of Onset   Cancer Mother        colon   Heart attack Mother 24   Suicidality Father    Diabetes Father    Cancer Maternal Uncle        colon   Stroke Maternal Grandmother    Diabetes Paternal Grandmother    Social History   Socioeconomic History   Marital status: Married    Spouse name: Not on file   Number of children: Not on file   Years of education: Not on file   Highest education  level: Not on file  Occupational History   Not on file  Tobacco Use   Smoking status: Never   Smokeless tobacco: Never  Vaping Use   Vaping Use: Never used  Substance and Sexual Activity   Alcohol use: Yes    Alcohol/week: 5.0 standard drinks of alcohol    Types: 5 Standard drinks or equivalent per week   Drug use: Never   Sexual activity: Yes  Other Topics Concern   Not on file  Social History Narrative   Diet:      Caffeine: yes      Married, if yes what year: yes.1980      Do you live in a house, apartment, assisted living, condo, trailer, ect: house      Is it one or more stories: none      How many persons live in your home? 2      Pets: no      Highest level or education completed: Master of social work      IT sales professional profession: Educational psychologist      Exercise: yes                 Type and how often: treadmill and grow young fitness videos         Living Will: Yes   DNR: No   POA/HPOA: No      Functional Status:   Do you have difficulty bathing or dressing yourself? No   Do you have difficulty preparing food or eating? No   Do you have difficulty managing your medications? No   Do you have difficulty managing your finances? No   Do you have difficulty affording your medications? NO   0   Social Determinants of Health   Financial Resource Strain: Low Risk  (02/21/2022)   Overall Financial Resource Strain (CARDIA)    Difficulty of Paying Living Expenses: Not hard at all  Food Insecurity: No Food Insecurity (02/21/2022)   Hunger Vital Sign    Worried About Running Out of Food in the Last Year: Never true    Ran Out of Food in the Last Year: Never true  Transportation Needs: No Transportation Needs (02/21/2022)   PRAPARE - Hydrologist (Medical): No    Lack of Transportation (Non-Medical): No  Physical Activity: Insufficiently Active (02/21/2022)   Exercise Vital Sign    Days of Exercise per Week: 3 days     Minutes of Exercise per Session: 30 min  Stress: No Stress Concern Present (02/21/2022)   [place]    Feeling of Stress : Not at all  Social Connections: Moderately Integrated (02/21/2022)   Social  Connection and Isolation Panel [NHANES]    Frequency of Communication with Friends and Family: More than three times a week    Frequency of Social Gatherings with Friends and Family: Twice a week    Attends Religious Services: 1 to 4 times per year    Active Member of [employer] or Organizations: No    Attends Music therapist: Never    Marital Status: Married    Tobacco Counseling Counseling given: Not Answered   Clinical Intake:  Pre-visit preparation completed: Yes  Pain : No/denies pain  Diabetes: No  How often do you need to have someone help you when you read instructions, pamphlets, or other written materials from your doctor or pharmacy?: 1 - Never  Diabetic?No   Interpreter Needed?: No  Information entered by :: Denman George LPN   Activities of Daily Living    02/21/2022    3:38 PM 02/16/2022   10:06 AM  In your present state of health, do you have any difficulty performing the following activities:  Hearing? 0 0  Vision? 0 0  Difficulty concentrating or making decisions? 0 0  Walking or climbing stairs? 0 0  Dressing or bathing? 0 0  Doing errands, shopping? 0 0  Preparing Food and eating ? N N  Using the Toilet? N N  In the past six months, have you accidently leaked urine? N N  Do you have problems with loss of bowel control? N N  Managing your Medications? N N  Managing your Finances? N N  Housekeeping or managing your Housekeeping? N N    Patient Care Team: Ailene Ards, NP as PCP - General (Nurse Practitioner) Linton Rump, PT as Physical Therapist (Physical Therapy) Herold Harms (Physician Assistant) Magnus Sinning, MD as Consulting Physician (Physical Medicine  and Rehabilitation)  Indicate any recent Medical Services you may have received from other than [facility] providers in the past year (date may be approximate).     Assessment:   This is a routine wellness examination for Penny Gardner.  Hearing/Vision screen Hearing Screening - Comments:: Denies hearing difficulties   Vision Screening - Comments:: Wears rx glasses - up to date with routine eye exams    Dietary issues and exercise activities discussed: Current Exercise Habits: Home exercise routine, Time (Minutes): 30, Frequency (Times/Week): 3, Weekly Exercise (Minutes/Week): 90, Intensity: Mild   Goals Addressed             This Visit's Progress    Remain Healthy and Active        Depression Screen    02/21/2022    3:36 PM 08/17/2021    8:12 AM 08/03/2020   10:20 AM 07/07/2020    9:21 AM 04/08/2017    8:52 AM  PHQ 2/9 Scores  PHQ - 2 Score 0 0 0 0 0    Fall Risk    02/21/2022    3:34 PM 02/16/2022   10:06 AM 08/17/2021    8:12 AM 08/03/2020   10:20 AM 07/27/2020    1:53 PM  [place] in the past year? 0 0 0 0 0  Number falls in past yr:  0 0  0  Injury with Fall? 0 0 0  0  Risk for fall due to :     No Fall Risks  Follow up Falls prevention discussed;Education provided;Falls evaluation completed   Falls evaluation completed Falls evaluation completed    FALL RISK PREVENTION PERTAINING TO THE HOME:  Any stairs  in or around the home? Yes  If so, are there any without handrails? No  Home free of loose throw rugs in walkways, pet beds, electrical cords, etc? Yes  Adequate lighting in your home to reduce risk of falls? Yes   ASSISTIVE DEVICES UTILIZED TO PREVENT FALLS:  Life alert? No  Use of a cane, walker or w/c? No  Grab bars in the bathroom? Yes  Shower chair or bench in shower? No  Elevated toilet seat or a handicapped toilet? Yes   TIMED UP AND GO:  Was the test performed? No . Telephonic visit   Cognitive Function:        02/21/2022    3:39 PM  07/07/2020    9:24 AM  6CIT Screen  What Year? 0 points 0 points  What month? 0 points 0 points  What time? 0 points 0 points  Count back from 20 0 points 0 points  Months in reverse 0 points 0 points  Repeat phrase 0 points 0 points  Total Score 0 points 0 points    Immunizations Immunization History  Administered Date(s) Administered   Influenza, Seasonal, Injecte, Preservative Fre 10/12/2015, 10/16/2017   Influenza,inj,Quad PF,6+ Mos 10/12/2014, 09/05/2016, 09/10/2018   Influenza-Unspecified 11/17/2020   Moderna Sars-Covid-2 Vaccination 02/19/2019, 03/20/2019, 12/04/2019   Pneumococcal Conjugate-13 07/04/2020   Tdap 08/02/2011    TDAP status: Due, Education has been provided regarding the importance of this vaccine. Advised may receive this vaccine at local pharmacy or Health Dept. Aware to provide a copy of the vaccination record if obtained from local pharmacy or Health Dept. Verbalized acceptance and understanding.  Flu Vaccine status: Up to date  Pneumococcal vaccine status: Due, Education has been provided regarding the importance of this vaccine. Advised may receive this vaccine at local pharmacy or Health Dept. Aware to provide a copy of the vaccination record if obtained from local pharmacy or Health Dept. Verbalized acceptance and understanding.  Covid-19 vaccine status: Information provided on how to obtain vaccines.   Qualifies for Shingles Vaccine? Yes   Zostavax completed No   Shingrix Completed?: No.    Education has been provided regarding the importance of this vaccine. Patient has been advised to call insurance company to determine out of pocket expense if they have not yet received this vaccine. Advised may also receive vaccine at local pharmacy or Health Dept. Verbalized acceptance and understanding.  Screening Tests Health Maintenance  Topic Date Due   Zoster Vaccines- Shingrix (1 of 2) Never done   DTaP/Tdap/Td (2 - Td or Tdap) 08/01/2021   COVID-19  Vaccine (4 - 2023-24 season) 09/08/2021   Pneumonia Vaccine 72+ Years old (2 of 2 - PPSV23 or PCV20) 08/18/2022 (Originally 07/04/2021)   COLONOSCOPY (Pts 45-26yr Insurance coverage will need to be confirmed)  04/16/2022   MAMMOGRAM  12/29/2022   Medicare Annual Wellness (AWV)  02/22/2023   INFLUENZA VACCINE  Completed   DEXA SCAN  Completed   Hepatitis C Screening  Completed   HPV VACCINES  Aged Out    Health Maintenance  Health Maintenance Due  Topic Date Due   Zoster Vaccines- Shingrix (1 of 2) Never done   DTaP/Tdap/Td (2 - Td or Tdap) 08/01/2021   COVID-19 Vaccine (4 - 2023-24 season) 09/08/2021    Colorectal cancer screening: Type of screening: Colonoscopy. Completed 04/15/17. Repeat every 5 years  Mammogram status: Completed 12/28/20. Repeat every year  Bone Density status: Completed 12/28/20. Results reflect: Bone density results: OSTEOPENIA. Repeat every 2 years.  Lung  Cancer Screening: (Low Dose CT Chest recommended if Age 58-80 years, 30 pack-year currently smoking OR have quit w/in 15years.) does not qualify.   Lung Cancer Screening Referral: n/a   Additional Screening:  Hepatitis C Screening: does qualify; Completed 04/08/17  Vision Screening: Recommended annual ophthalmology exams for early detection of glaucoma and other disorders of the eye. Is the patient up to date with their annual eye exam?  Yes  Who is the provider or what is the name of the office in which the patient attends annual eye exams? Unable to provide If pt is not established with a provider, would they like to be referred to a provider to establish care? No .   Dental Screening: Recommended annual dental exams for proper oral hygiene  Community Resource Referral / Chronic Care Management: CRR required this visit?  No   CCM required this visit?  No      Plan:     I have personally reviewed and noted the following in the patient's chart:   Medical and social history Use of alcohol,  tobacco or illicit drugs  Current medications and supplements including opioid prescriptions. Patient is not currently taking opioid prescriptions. Functional ability and status Nutritional status Physical activity Advanced directives List of other physicians Hospitalizations, surgeries, and ER visits in previous 12 months Vitals Screenings to include cognitive, depression, and falls Referrals and appointments  In addition, I have reviewed and discussed with patient certain preventive protocols, quality metrics, and best practice recommendations. A written personalized care plan for preventive services as well as general preventive health recommendations were provided to patient.     Vanetta Mulders, [place]   075-GRM   Due to this being a virtual visit, the after visit summary with patients personalized plan was offered to patient via mail or my-chart. Patient would like to access on my-chart  Nurse Notes: No concerns

## 2022-02-21 NOTE — Patient Instructions (Addendum)
[name] , Thank you for taking time to come for your Medicare Wellness Visit. I appreciate your ongoing commitment to your health goals. Please review the following plan we discussed and let me know if I can assist you in the future.   These are the goals we discussed:  Goals      Remain Healthy and Active        This is a list of the screening recommended for you and due dates:  Health Maintenance  Topic Date Due   Zoster (Shingles) Vaccine (1 of 2) Never done   DTaP/Tdap/Td vaccine (2 - Td or Tdap) 08/01/2021   COVID-19 Vaccine (4 - 2023-24 season) 09/08/2021   Pneumonia Vaccine (2 of 2 - PPSV23 or PCV20) 08/18/2022*   Colon Cancer Screening  04/16/2022   Mammogram  12/29/2022   Medicare Annual Wellness Visit  02/22/2023   Flu Shot  Completed   DEXA scan (bone density measurement)  Completed   Hepatitis C Screening: USPSTF Recommendation to screen - Ages 18-79 yo.  Completed   HPV Vaccine  Aged Out  *Topic was postponed. The date shown is not the original due date.    Advanced directives: Please bring a copy of your health care power of attorney and living will to the office to be added to your chart at your convenience.   Conditions/risks identified: Aim for 30 minutes of exercise or brisk walking, 6-8 glasses of water, and 5 servings of fruits and vegetables each day.   Next appointment: Follow up in one year for your annual wellness visit    Preventive Care 65 Years and Older, Female Preventive care refers to lifestyle choices and visits with your health care provider that can promote health and wellness. What does preventive care include? A yearly physical exam. This is also called an annual well check. Dental exams once or twice a year. Routine eye exams. Ask your health care provider how often you should have your eyes checked. Personal lifestyle choices, including: Daily care of your teeth and gums. Regular physical activity. Eating a healthy diet. Avoiding  tobacco and drug use. Limiting alcohol use. Practicing safe sex. Taking low-dose aspirin every day. Taking vitamin and mineral supplements as recommended by your health care provider. What happens during an annual well check? The services and screenings done by your health care provider during your annual well check will depend on your age, overall health, lifestyle risk factors, and family history of disease. Counseling  Your health care provider may ask you questions about your: Alcohol use. Tobacco use. Drug use. Emotional well-being. Home and relationship well-being. Sexual activity. Eating habits. History of falls. Memory and ability to understand (cognition). Work and work Statistician. Reproductive health. Screening  You may have the following tests or measurements: Height, weight, and BMI. Blood pressure. Lipid and cholesterol levels. These may be checked every 5 years, or more frequently if you are over 61 years old. Skin check. Lung cancer screening. You may have this screening every year starting at age 75 if you have a 30-pack-year history of smoking and currently smoke or have quit within the past 15 years. Fecal occult blood test (FOBT) of the stool. You may have this test every year starting at age 35. Flexible sigmoidoscopy or colonoscopy. You may have a sigmoidoscopy every 5 years or a colonoscopy every 10 years starting at age 28. Hepatitis C blood test. Hepatitis B blood test. Sexually transmitted disease (STD) testing. Diabetes screening. This is done by checking  your blood sugar (glucose) after you have not eaten for a while (fasting). You may have this done every 1-3 years. Bone density scan. This is done to screen for osteoporosis. You may have this done starting at age 59. Mammogram. This may be done every 1-2 years. Talk to your health care provider about how often you should have regular mammograms. Talk with your health care provider about your test  results, treatment options, and if necessary, the need for more tests. Vaccines  Your health care provider may recommend certain vaccines, such as: Influenza vaccine. This is recommended every year. Tetanus, diphtheria, and acellular pertussis (Tdap, Td) vaccine. You may need a Td booster every 10 years. Zoster vaccine. You may need this after age 9. Pneumococcal 13-valent conjugate (PCV13) vaccine. One dose is recommended after age 34. Pneumococcal polysaccharide (PPSV23) vaccine. One dose is recommended after age 39. Talk to your health care provider about which screenings and vaccines you need and how often you need them. This information is not intended to replace advice given to you by your health care provider. Make sure you discuss any questions you have with your health care provider. Document Released: 01/21/2015 Document Revised: 09/14/2015 Document Reviewed: 10/26/2014 Elsevier Interactive Patient Education  2017 [place] Prevention in the Home Falls can cause injuries. They can happen to people of all ages. There are many things you can do to make your home safe and to help prevent falls. What can I do on the outside of my home? Regularly fix the edges of walkways and driveways and fix any cracks. Remove anything that might make you trip as you walk through a door, such as a raised step or threshold. Trim any bushes or trees on the path to your home. Use bright outdoor lighting. Clear any walking paths of anything that might make someone trip, such as rocks or tools. Regularly check to see if handrails are loose or broken. Make sure that both sides of any steps have handrails. Any raised decks and porches should have guardrails on the edges. Have any leaves, snow, or ice cleared regularly. Use sand or salt on walking paths during winter. Clean up any spills in your garage right away. This includes oil or grease spills. What can I do in the bathroom? Use night  lights. Install grab bars by the toilet and in the tub and shower. Do not use towel bars as grab bars. Use non-skid mats or decals in the tub or shower. If you need to sit down in the shower, use a plastic, non-slip stool. Keep the floor dry. Clean up any water that spills on the floor as soon as it happens. Remove soap buildup in the tub or shower regularly. Attach bath mats securely with double-sided non-slip rug tape. Do not have throw rugs and other things on the floor that can make you trip. What can I do in the bedroom? Use night lights. Make sure that you have a light by your bed that is easy to reach. Do not use any sheets or blankets that are too big for your bed. They should not hang down onto the floor. Have a firm chair that has side arms. You can use this for support while you get dressed. Do not have throw rugs and other things on the floor that can make you trip. What can I do in the kitchen? Clean up any spills right away. Avoid walking on wet floors. Keep items that you use a lot  in easy-to-reach places. If you need to reach something above you, use a strong step stool that has a grab bar. Keep electrical cords out of the way. Do not use floor polish or wax that makes floors slippery. If you must use wax, use non-skid floor wax. Do not have throw rugs and other things on the floor that can make you trip. What can I do with my stairs? Do not leave any items on the stairs. Make sure that there are handrails on both sides of the stairs and use them. Fix handrails that are broken or loose. Make sure that handrails are as long as the stairways. Check any carpeting to make sure that it is firmly attached to the stairs. Fix any carpet that is loose or worn. Avoid having throw rugs at the top or bottom of the stairs. If you do have throw rugs, attach them to the floor with carpet tape. Make sure that you have a light switch at the top of the stairs and the bottom of the stairs. If  you do not have them, ask someone to add them for you. What else can I do to help prevent falls? Wear shoes that: Do not have high heels. Have rubber bottoms. Are comfortable and fit you well. Are closed at the toe. Do not wear sandals. If you use a stepladder: Make sure that it is fully opened. Do not climb a closed stepladder. Make sure that both sides of the stepladder are locked into place. Ask someone to hold it for you, if possible. Clearly mark and make sure that you can see: Any grab bars or handrails. First and last steps. Where the edge of each step is. Use tools that help you move around (mobility aids) if they are needed. These include: Canes. Walkers. Scooters. Crutches. Turn on the lights when you go into a dark area. Replace any light bulbs as soon as they burn out. Set up your furniture so you have a clear path. Avoid moving your furniture around. If any of your floors are uneven, fix them. If there are any pets around you, be aware of where they are. Review your medicines with your doctor. Some medicines can make you feel dizzy. This can increase your chance of falling. Ask your doctor what other things that you can do to help prevent falls. This information is not intended to replace advice given to you by your health care provider. Make sure you discuss any questions you have with your health care provider. Document Released: 10/21/2008 Document Revised: 06/02/2015 Document Reviewed: 01/29/2014 Elsevier Interactive Patient Education  2017 [employer].

## 2022-03-15 ENCOUNTER — Ambulatory Visit (INDEPENDENT_AMBULATORY_CARE_PROVIDER_SITE_OTHER): Payer: Medicare HMO | Admitting: Nurse Practitioner

## 2022-03-15 ENCOUNTER — Encounter: Payer: Self-pay | Admitting: Radiology

## 2022-03-15 VITALS — BP 132/88 | HR 107 | Temp 97.9°F | Ht 65.0 in | Wt 170.2 lb

## 2022-03-15 DIAGNOSIS — Z131 Encounter for screening for diabetes mellitus: Secondary | ICD-10-CM | POA: Diagnosis not present

## 2022-03-15 DIAGNOSIS — Z23 Encounter for immunization: Secondary | ICD-10-CM | POA: Diagnosis not present

## 2022-03-15 DIAGNOSIS — E785 Hyperlipidemia, unspecified: Secondary | ICD-10-CM

## 2022-03-15 DIAGNOSIS — E559 Vitamin D deficiency, unspecified: Secondary | ICD-10-CM | POA: Diagnosis not present

## 2022-03-15 NOTE — Assessment & Plan Note (Signed)
Labs ordered today, patient return to office next week for fasting lipid panel.  Further recommendations may be made based upon these results.

## 2022-03-15 NOTE — Assessment & Plan Note (Signed)
Labs ordered today, patient was back next week to have labs drawn.  Further recommendations may be made based upon these results.

## 2022-03-15 NOTE — Progress Notes (Signed)
Established Patient Office Visit  Subjective   Patient ID: Penny Gardner, female    DOB: 16-Nov-1955  Age: 67 y.o. MRN: [id number]  Chief Complaint  Patient presents with   Medical Management of Chronic Issues    On Vitamin D recheck, pneumonia vax    Here for f/u. States she feels good and has no concerns. Has vitamin D deficiency, prediabetes, and hyperlipidemia and is due for labs today. Due for pnuemonia vaccine.     Review of Systems  Eyes:  Negative for blurred vision.  Respiratory:  Negative for shortness of breath.   Cardiovascular:  Negative for chest pain.  Neurological:  Negative for dizziness and headaches.      Objective:     BP 132/88   Pulse (!) 107   Temp 97.9 F (36.6 C) (Temporal)   Ht '5\' 5"'$  (1.651 m)   Wt 170 lb 4 oz (77.2 kg)   SpO2 98%   BMI 28.33 kg/m  BP Readings from Last 3 Encounters:  03/15/22 132/88  08/17/21 124/76  05/11/21 136/84   Wt Readings from Last 3 Encounters:  03/15/22 170 lb 4 oz (77.2 kg)  02/21/22 177 lb (80.3 kg)  08/17/21 177 lb (80.3 kg)      Physical Exam Vitals reviewed.  Constitutional:      General: She is not in acute distress.    Appearance: Normal appearance.  HENT:     Head: Normocephalic and atraumatic.  Neck:     Vascular: No carotid bruit.  Cardiovascular:     Rate and Rhythm: Normal rate and regular rhythm.     Pulses: Normal pulses.     Heart sounds: Normal heart sounds.  Pulmonary:     Effort: Pulmonary effort is normal.     Breath sounds: Normal breath sounds.  Skin:    General: Skin is warm and dry.  Neurological:     General: No focal deficit present.     Mental Status: She is alert and oriented to person, place, and time.  Psychiatric:        Mood and Affect: Mood normal.        Behavior: Behavior normal.        Judgment: Judgment normal.      No results found for any visits on 03/15/22.    The 10-year ASCVD risk score (Arnett DK, et al., 2019) is: 7.6%    Assessment  & Plan:   Problem List Items Addressed This Visit       Other   HLD (hyperlipidemia)    Labs ordered today, patient return to office next week for fasting lipid panel.  Further recommendations may be made based upon these results.      Relevant Orders   Lipid panel   Diabetes mellitus screening    Labs ordered today, further recommendations may be made based upon these results.      Relevant Orders   Hemoglobin A1c   Vitamin D deficiency - Primary    Labs ordered today, patient was back next week to have labs drawn.  Further recommendations may be made based upon these results.      Relevant Orders   Vitamin D (25 hydroxy)   Basic metabolic panel   Need for vaccination    Pneumonia vaccine administered today, VIS provided.      Relevant Orders   Pneumococcal conjugate vaccine 20-valent (Prevnar 20)    Return in about 6 months (around 09/15/2022) for 6-12 months F/U with Judson Roch.  Ailene Ards, NP

## 2022-03-15 NOTE — Assessment & Plan Note (Signed)
Labs ordered today, further recommendations may be made based upon these results.

## 2022-03-15 NOTE — Assessment & Plan Note (Signed)
Pneumonia vaccine administered today, VIS provided.

## 2022-03-20 ENCOUNTER — Other Ambulatory Visit (INDEPENDENT_AMBULATORY_CARE_PROVIDER_SITE_OTHER): Payer: Medicare HMO

## 2022-03-20 DIAGNOSIS — E559 Vitamin D deficiency, unspecified: Secondary | ICD-10-CM

## 2022-03-20 DIAGNOSIS — E785 Hyperlipidemia, unspecified: Secondary | ICD-10-CM | POA: Diagnosis not present

## 2022-03-20 DIAGNOSIS — Z131 Encounter for screening for diabetes mellitus: Secondary | ICD-10-CM | POA: Diagnosis not present

## 2022-03-20 LAB — LIPID PANEL
Cholesterol: 205 mg/dL — ABNORMAL HIGH (ref 0–200)
HDL: 47.3 mg/dL (ref >39.00)
LDL Cholesterol: 119 mg/dL — ABNORMAL HIGH (ref 0–99)
NonHDL: 157.4
Total CHOL/HDL Ratio: 4
Triglycerides: 191 mg/dL — ABNORMAL HIGH (ref 0.0–149.0)
VLDL: 38.2 mg/dL (ref 0.0–40.0)

## 2022-03-20 LAB — HEMOGLOBIN A1C: Hgb A1c MFr Bld: 5.8 % (ref 4.6–6.5)

## 2022-03-20 LAB — BASIC METABOLIC PANEL
BUN: 22 mg/dL (ref 6–23)
CO2: 27 mEq/L (ref 19–32)
Calcium: 10.1 mg/dL (ref 8.4–10.5)
Chloride: 101 mEq/L (ref 96–112)
Creatinine, Ser: 0.75 mg/dL (ref 0.40–1.20)
GFR: 82.29 mL/min (ref >60.00)
Glucose, Bld: 98 mg/dL (ref 70–99)
Potassium: 4.3 mEq/L (ref 3.5–5.1)
Sodium: 139 mEq/L (ref 135–145)

## 2022-03-20 LAB — VITAMIN D 25 HYDROXY (VIT D DEFICIENCY, FRACTURES): VITD: 28.06 ng/mL — ABNORMAL LOW (ref 30.00–100.00)

## 2022-06-29 ENCOUNTER — Ambulatory Visit (INDEPENDENT_AMBULATORY_CARE_PROVIDER_SITE_OTHER): Payer: Medicare HMO | Admitting: Nurse Practitioner

## 2022-06-29 VITALS — BP 142/94 | HR 98 | Temp 98.6°F | Ht 65.0 in | Wt 170.1 lb

## 2022-06-29 DIAGNOSIS — R058 Other specified cough: Secondary | ICD-10-CM

## 2022-06-29 MED ORDER — BENZONATATE 100 MG PO CAPS: 100.0000 mg | ORAL_CAPSULE | Freq: Two times a day (BID) | ORAL | 0 refills | Status: DC | PRN

## 2022-06-29 MED ORDER — ALBUTEROL SULFATE HFA 108 (90 BASE) MCG/ACT IN AERS: 2.0000 | INHALATION_SPRAY | Freq: Four times a day (QID) | RESPIRATORY_TRACT | 0 refills | Status: DC | PRN

## 2022-06-29 NOTE — Progress Notes (Signed)
   Established Patient Office Visit  Subjective   Patient ID: Penny Gardner, female    DOB: 07-06-1955  Age: 67 y.o. MRN: [id number]  Chief Complaint  Patient presents with   Cough   Cough: Symptom onset 3.5 weeks ago. Started with URI and overall she feels better now but still has lingering cough. Wanted to be evaluated. Took at home covid test and it was negative. No longer experiencing fever. Has no SOB but does have "chest tightness" with coughing at times. Has had bronchitis in the past. Cough is sometimes productive and sometimes dry.     Review of Systems  Constitutional:  Positive for fever (now resolved) and malaise/fatigue.  Respiratory:  Positive for cough, sputum production and wheezing. Negative for shortness of breath.   Cardiovascular:  Negative for chest pain.      Objective:     BP (!) 142/94   Pulse 98   Temp 98.6 F (37 C) (Temporal)   Ht 5\' 5"  (1.651 m)   Wt 170 lb 2 oz (77.2 kg)   SpO2 96%   BMI 28.31 kg/m  BP Readings from Last 3 Encounters:  06/29/22 (!) 142/94  03/15/22 132/88  08/17/21 124/76   Wt Readings from Last 3 Encounters:  06/29/22 170 lb 2 oz (77.2 kg)  03/15/22 170 lb 4 oz (77.2 kg)  02/21/22 177 lb (80.3 kg)      Physical Exam Vitals reviewed.  Constitutional:      General: She is not in acute distress.    Appearance: Normal appearance.  HENT:     Head: Normocephalic and atraumatic.  Neck:     Vascular: No carotid bruit.  Cardiovascular:     Rate and Rhythm: Normal rate and regular rhythm.     Pulses: Normal pulses.     Heart sounds: Normal heart sounds.  Pulmonary:     Effort: Pulmonary effort is normal.     Breath sounds: Normal breath sounds.  Skin:    General: Skin is warm and dry.  Neurological:     General: No focal deficit present.     Mental Status: She is alert and oriented to person, place, and time.  Psychiatric:        Mood and Affect: Mood normal.        Behavior: Behavior normal.         Judgment: Judgment normal.      No results found for any visits on 06/29/22.    The 10-year ASCVD risk score (Arnett DK, et al., 2019) is: 8.9%    Assessment & Plan:   Problem List Items Addressed This Visit       Respiratory   Post-viral cough syndrome - Primary    Acute, symptoms seem consistent with either postviral cough or bronchitis. Systemic symptoms have now resolved and no clinical evidence of pneumonia on exam. For now we will treat cough with Mucinex DM as needed, Tessalon Perles as needed, and albuterol inhaler as needed. Patient was told to follow-up if symptoms worsen or do not improve within the next few weeks.      Relevant Medications   dextromethorphan-guaiFENesin (MUCINEX DM) 30-600 MG 12hr tablet   benzonatate (TESSALON) 100 MG capsule   albuterol (VENTOLIN HFA) 108 (90 Base) MCG/ACT inhaler    Return in about 3 months (around 09/29/2022) for 3-6 months F/U with [name].    Elenore Paddy, NP

## 2022-06-29 NOTE — Assessment & Plan Note (Signed)
Acute, symptoms seem consistent with either postviral cough or bronchitis. Systemic symptoms have now resolved and no clinical evidence of pneumonia on exam. For now we will treat cough with Mucinex DM as needed, Tessalon Perles as needed, and albuterol inhaler as needed. Patient was told to follow-up if symptoms worsen or do not improve within the next few weeks.

## 2022-09-28 DIAGNOSIS — D485 Neoplasm of uncertain behavior of skin: Secondary | ICD-10-CM | POA: Diagnosis not present

## 2022-09-28 DIAGNOSIS — L821 Other seborrheic keratosis: Secondary | ICD-10-CM | POA: Diagnosis not present

## 2022-09-28 DIAGNOSIS — L814 Other melanin hyperpigmentation: Secondary | ICD-10-CM | POA: Diagnosis not present

## 2022-09-28 DIAGNOSIS — L738 Other specified follicular disorders: Secondary | ICD-10-CM | POA: Diagnosis not present

## 2022-09-28 DIAGNOSIS — D225 Melanocytic nevi of trunk: Secondary | ICD-10-CM | POA: Diagnosis not present

## 2022-09-28 DIAGNOSIS — L718 Other rosacea: Secondary | ICD-10-CM | POA: Diagnosis not present

## 2022-09-28 DIAGNOSIS — D235 Other benign neoplasm of skin of trunk: Secondary | ICD-10-CM | POA: Diagnosis not present

## 2022-12-27 ENCOUNTER — Ambulatory Visit: Payer: Medicare HMO | Admitting: Nurse Practitioner

## 2023-01-05 ENCOUNTER — Emergency Department
Admission: EM | Admit: 2023-01-05 | Discharge: 2023-01-05 | Disposition: A | Discharge status: Home / Self Care | Payer: Medicare HMO | Attending: Emergency Medicine | Admitting: Emergency Medicine

## 2023-01-05 ENCOUNTER — Other Ambulatory Visit: Payer: Self-pay

## 2023-01-05 ENCOUNTER — Emergency Department: Payer: Medicare HMO

## 2023-01-05 ENCOUNTER — Telehealth: Payer: Medicare HMO

## 2023-01-05 DIAGNOSIS — J039 Acute tonsillitis, unspecified: Secondary | ICD-10-CM | POA: Diagnosis not present

## 2023-01-05 DIAGNOSIS — I6522 Occlusion and stenosis of left carotid artery: Secondary | ICD-10-CM | POA: Diagnosis not present

## 2023-01-05 DIAGNOSIS — R509 Fever, unspecified: Secondary | ICD-10-CM | POA: Diagnosis present

## 2023-01-05 DIAGNOSIS — D72829 Elevated white blood cell count, unspecified: Secondary | ICD-10-CM | POA: Insufficient documentation

## 2023-01-05 DIAGNOSIS — E041 Nontoxic single thyroid nodule: Secondary | ICD-10-CM | POA: Diagnosis not present

## 2023-01-05 DIAGNOSIS — I672 Cerebral atherosclerosis: Secondary | ICD-10-CM | POA: Diagnosis not present

## 2023-01-05 DIAGNOSIS — E860 Dehydration: Secondary | ICD-10-CM | POA: Diagnosis not present

## 2023-01-05 DIAGNOSIS — R590 Localized enlarged lymph nodes: Secondary | ICD-10-CM | POA: Diagnosis not present

## 2023-01-05 LAB — BASIC METABOLIC PANEL
Anion gap: 11 (ref 5–15)
BUN: 12 mg/dL (ref 8–23)
CO2: 22 mmol/L (ref 22–32)
Calcium: 9 mg/dL (ref 8.9–10.3)
Chloride: 102 mmol/L (ref 98–111)
Creatinine, Ser: 0.7 mg/dL (ref 0.44–1.00)
GFR, Estimated: >60 mL/min (ref >60)
Glucose, Bld: 178 mg/dL — ABNORMAL HIGH (ref 70–99)
Potassium: 3.7 mmol/L (ref 3.5–5.1)
Sodium: 135 mmol/L (ref 135–145)

## 2023-01-05 LAB — CBC
HCT: 42.5 % (ref 36.0–46.0)
Hemoglobin: 13.9 g/dL (ref 12.0–15.0)
MCH: 29.8 pg (ref 26.0–34.0)
MCHC: 32.7 g/dL (ref 30.0–36.0)
MCV: 91 fL (ref 80.0–100.0)
Platelets: 189 10*3/uL (ref 150–400)
RBC: 4.67 MIL/uL (ref 3.87–5.11)
RDW: 12.7 % (ref 11.5–15.5)
WBC: 14.4 10*3/uL — ABNORMAL HIGH (ref 4.0–10.5)
nRBC: 0 % (ref 0.0–0.2)

## 2023-01-05 LAB — GROUP A STREP BY PCR: Group A Strep by PCR: NOT DETECTED

## 2023-01-05 LAB — MONONUCLEOSIS SCREEN: Mono Screen: NEGATIVE

## 2023-01-05 MED ORDER — SODIUM CHLORIDE 0.9 % IV SOLN
3.0000 g | Freq: Once | INTRAVENOUS | Status: AC
  Administered 2023-01-05: 3 g via INTRAVENOUS
  Filled 2023-01-05: qty 8

## 2023-01-05 MED ORDER — AMOXICILLIN-POT CLAVULANATE 875-125 MG PO TABS: 1.0000 | ORAL_TABLET | Freq: Two times a day (BID) | ORAL | 0 refills | Status: AC

## 2023-01-05 MED ORDER — IOHEXOL 300 MG/ML  SOLN
75.0000 mL | Freq: Once | INTRAMUSCULAR | Status: AC | PRN
  Administered 2023-01-05: 75 mL via INTRAVENOUS

## 2023-01-05 MED ORDER — ONDANSETRON 4 MG PO TBDP
4.0000 mg | ORAL_TABLET | Freq: Once | ORAL | Status: AC
  Administered 2023-01-05: 4 mg via ORAL
  Filled 2023-01-05: qty 1

## 2023-01-05 MED ORDER — LACTATED RINGERS IV BOLUS
1000.0000 mL | Freq: Once | INTRAVENOUS | Status: AC
  Administered 2023-01-05: 1000 mL via INTRAVENOUS

## 2023-01-05 MED ORDER — KETOROLAC TROMETHAMINE 15 MG/ML IJ SOLN
15.0000 mg | Freq: Once | INTRAMUSCULAR | Status: AC
  Administered 2023-01-05: 15 mg via INTRAVENOUS
  Filled 2023-01-05: qty 1

## 2023-01-05 NOTE — ED Triage Notes (Signed)
Pt presents to ER with c/o fever and tonsillitis that has been ongoing for last 3 days.  Pt reports she has had a fever of around 103 at home for the last 3 days, with the temps mostly spiking at night.  Pt states she has taken ibuprofen, which has helped with the fever, but the pain is still there.  Pt has some very red, swollen tonsils with what appears to be some tonsil stones.  Pt is otherwise A&O x4 and in NAD at this time.

## 2023-01-05 NOTE — ED Provider Notes (Signed)
[facility] Provider Note    Event Date/Time   First MD Initiated Contact with Patient 01/05/23 [phone]     (approximate)   History   Fever and Sore Throat   HPI  Penny Gardner is a 67 y.o. female presents to the emergency department with not feeling well.  States that 2 days ago she started having chills and bodyaches.  Febrile to 103.  Complaining of a sore throat and decreased oral intake and feeling dehydrated.  Does have a granddaughter but [place] that [place].  No history of strep throat.  No known exposure to mono.  States that she has been tolerating ibuprofen and Tylenol.  Denies abdominal pain.  No recent antibiotic use.     Physical Exam   Triage Vital Signs: ED Triage Vitals  Encounter Vitals Group     BP 01/05/23 0557 (!) 147/108     Systolic BP Percentile --      Diastolic BP Percentile --      Pulse Rate 01/05/23 0557 (!) 110     Resp 01/05/23 0557 20     Temp 01/05/23 0557 98.1 F (36.7 C)     Temp Source 01/05/23 0557 Oral     SpO2 01/05/23 0557 99 %     Weight 01/05/23 0559 168 lb (76.2 kg)     Height 01/05/23 0559 5\' 5"  (1.651 m)     Head Circumference --      Peak Flow --      Pain Score 01/05/23 0559 10     Pain Loc --      Pain Education --      Exclude from Growth Chart --     Most recent vital signs: Vitals:   01/05/23 0557  BP: (!) 147/108  Pulse: (!) 110  Resp: 20  Temp: 98.1 F (36.7 C)  SpO2: 99%    Physical Exam Constitutional:      Appearance: She is well-developed.  HENT:     Head: Atraumatic.     Mouth/Throat:     Mouth: Mucous membranes are dry.     Pharynx: Oropharyngeal exudate and posterior oropharyngeal erythema present.     Tonsils: Tonsillar exudate present. 2+ on the right. 3+ on the left.     Comments: Fullness to the left tonsil with no uvula deviation Eyes:     Conjunctiva/sclera: Conjunctivae normal.  Cardiovascular:     Rate and Rhythm: Regular rhythm.   Pulmonary:     Effort: No respiratory distress.  Abdominal:     General: There is no distension.  Musculoskeletal:        General: Normal range of motion.     Cervical back: Normal range of motion.  Skin:    General: Skin is warm.  Neurological:     Mental Status: She is alert. Mental status is at baseline.      IMPRESSION / MDM / ASSESSMENT AND PLAN / ED COURSE  I reviewed the triage vital signs and the nursing notes.  Differential diagnosis including strep throat, viral pharyngitis, mono, peritonsillar abscess, tonsillitis   RADIOLOGY I independently reviewed imaging, my interpretation of imaging: Mild asymmetric tonsillar enhancement but no obvious abscess.  Read as acute tonsillitis with reactive cervical lymphadenopathy but no tonsillar abscess.   Labs (all labs ordered are listed, but only abnormal results are displayed) Labs interpreted as -    Labs Reviewed  CBC - Abnormal; Notable for the following components:  Result Value   WBC 14.4 (*)    All other components within normal limits  BASIC METABOLIC PANEL - Abnormal; Notable for the following components:   Glucose, Bld 178 (*)    All other components within normal limits  GROUP A STREP BY PCR  MONONUCLEOSIS SCREEN      Clinical picture concerning for acute tonsillitis.  Patient does have leukocytosis.  Creatinine appears at baseline.  Strep testing is negative.  Monoscreen was obtained.  Tachycardic and does appear clinically dehydrated.  Given IV fluids and IV Unasyn to treat for acute tonsillitis.  On reevaluation patient able to tolerate p.o.  Will start on antibiotics and discussed increasing oral hydration.  Given information to follow-up with ENT and discussed close follow-up with her primary care physician.  Discussed return precautions to the emergency department for any ongoing or worsening symptoms.   PROCEDURES:  Critical Care performed: No  Procedures  Patient's presentation is most  consistent with acute presentation with potential threat to life or bodily function.   MEDICATIONS ORDERED IN ED: Medications  Ampicillin-Sulbactam (UNASYN) 3 g in sodium chloride 0.9 % 100 mL IVPB (has no administration in time range)  lactated ringers bolus 1,000 mL (has no administration in time range)  ketorolac (TORADOL) 15 MG/ML injection 15 mg (has no administration in time range)  ondansetron (ZOFRAN-ODT) disintegrating tablet 4 mg (4 mg Oral Given 01/05/23 0606)  iohexol (OMNIPAQUE) 300 MG/ML solution 75 mL (75 mLs Intravenous Contrast Given 01/05/23 0715)    FINAL CLINICAL IMPRESSION(S) / ED DIAGNOSES   Final diagnoses:  Tonsillitis  Dehydration     Rx / DC Orders   ED Discharge Orders     None        Note:  This document was prepared using Dragon voice recognition software and may include unintentional dictation errors.   Corena Herter, MD 01/05/23 1032

## 2023-01-05 NOTE — Discharge Instructions (Addendum)
You were seen in the emergency department for fever and sore throat.  You had a CT scan and were diagnosed with tonsillitis.  You were given IV fluids and IV antibiotics in the emergency department.  You were given a prescription for antibiotics, take your first dose this afternoon.  You are given information to follow-up with the ear nose and throat specialist.  You are also given information to follow-up with your primary care provider.  If you have worsening symptoms, signs of dehydration or ongoing fever return to the emergency department for reevaluation.  Stay hydrated and drink plenty of fluids.  Alternate Motrin and Tylenol for pain control.  Pain control:  Ibuprofen (motrin/aleve/advil) - You can take 3 tablets (600 mg) every 6 hours as needed for pain/fever.  Acetaminophen (tylenol) - You can take 2 extra strength tablets (1000 mg) every 6 hours as needed for pain/fever.  You can alternate these medications or take them together.  Make sure you eat food/drink water when taking these medications.  Thank you for choosing [place] for your health care, it was my pleasure to care for you today!  Corena Herter, MD

## 2023-01-10 ENCOUNTER — Ambulatory Visit: Payer: Medicare HMO | Admitting: Nurse Practitioner

## 2023-01-10 VITALS — BP 118/74 | HR 82 | Temp 98.7°F | Ht 65.0 in | Wt 172.5 lb

## 2023-01-10 DIAGNOSIS — J039 Acute tonsillitis, unspecified: Secondary | ICD-10-CM | POA: Diagnosis not present

## 2023-01-10 NOTE — Progress Notes (Signed)
   Established Patient Office Visit  Subjective   Patient ID: Penny Gardner, female    DOB: August 07, 1955  Age: 68 y.o. MRN: [id number]  Chief Complaint  Patient presents with   Follow-up    ER visit 5 days ago for acute tonsillitis.    Patient arrives today for ER follow-up. 7 days ago she experienced rapid onset of fever, sore throat, and feeling of throat swelling. She went to the ER 2 days later and was diagnosed with acute tonsillitis and dehydration. Strep testing and mono testing were negative. She was treated with one dose of IV Unasyn , IVF, and discharged on 10 day course of Augmentin . She arrives today after completing 5 days of augmentin . She reports symptoms are much improved. Pain has resolved, she is now afebrile, no feeling of swelling in throat, no dysphagia, but does endorse some residual hoarseness.     ROS: see HPI    Objective:     BP 118/74   Pulse 82   Temp 98.7 F (37.1 C) (Temporal)   Ht 5' 5 (1.651 m)   Wt 172 lb 8 oz (78.2 kg)   SpO2 98%   BMI 28.71 kg/m    Physical Exam Vitals reviewed.  Constitutional:      General: She is not in acute distress.    Appearance: Normal appearance.  HENT:     Head: Normocephalic and atraumatic.     Mouth/Throat:     Mouth: Mucous membranes are moist. No oral lesions.     Tongue: No lesions.     Palate: No mass and lesions.     Pharynx: Oropharynx is clear. Uvula midline. No pharyngeal swelling, oropharyngeal exudate, posterior oropharyngeal erythema or uvula swelling.     Tonsils: No tonsillar exudate. 1+ on the right. 1+ on the left.  Neck:     Vascular: No carotid bruit.  Cardiovascular:     Rate and Rhythm: Normal rate and regular rhythm.     Pulses: Normal pulses.     Heart sounds: Normal heart sounds.  Pulmonary:     Effort: Pulmonary effort is normal.     Breath sounds: Normal breath sounds.  Skin:    General: Skin is warm and dry.  Neurological:     General: No focal deficit present.      Mental Status: She is alert and oriented to person, place, and time.  Psychiatric:        Mood and Affect: Mood normal.        Behavior: Behavior normal.        Judgment: Judgment normal.      No results found for any visits on 01/10/23.    The 10-year ASCVD risk score (Arnett DK, et al., 2019) is: 6.9%    Assessment & Plan:   Problem List Items Addressed This Visit       Respiratory   Tonsillitis - Primary   Acute, clinically appears to be much improved. Patient endorses improved symptoms as well. I encouraged her to complete her antibiotic course (she has approximately 5 days left) and to follow-up with ENT as scheduled on 01/24/23. She was encouraged to reach out if symptoms worsen between now and ENT appointment.        Return if symptoms worsen or fail to improve.    Lauraine FORBES Pereyra, NP

## 2023-01-10 NOTE — Assessment & Plan Note (Signed)
 Acute, clinically appears to be much improved. Patient endorses improved symptoms as well. I encouraged her to complete her antibiotic course (she has approximately 5 days left) and to follow-up with ENT as scheduled on 01/24/23. She was encouraged to reach out if symptoms worsen between now and ENT appointment.

## 2023-01-23 ENCOUNTER — Ambulatory Visit: Payer: Medicare HMO | Admitting: Nurse Practitioner

## 2023-01-24 DIAGNOSIS — J039 Acute tonsillitis, unspecified: Secondary | ICD-10-CM | POA: Diagnosis not present

## 2023-01-24 DIAGNOSIS — J310 Chronic rhinitis: Secondary | ICD-10-CM | POA: Diagnosis not present

## 2023-02-09 ENCOUNTER — Telehealth: Payer: Medicare HMO | Admitting: Family Medicine

## 2023-02-09 DIAGNOSIS — J029 Acute pharyngitis, unspecified: Secondary | ICD-10-CM | POA: Diagnosis not present

## 2023-02-09 NOTE — Progress Notes (Signed)
E-Visit for Sore Throat  We are sorry that you are not feeling well.  Here is how we plan to help!  Your symptoms indicate a likely viral infection (Pharyngitis).   Pharyngitis is inflammation in the back of the throat which can cause a sore throat, scratchiness and sometimes difficulty swallowing.   Pharyngitis is typically caused by a respiratory virus and will just run its course.  Please keep in mind that your symptoms could last up to 10 days.  For throat pain, we recommend over the counter oral pain relief medications such as acetaminophen or aspirin, or anti-inflammatory medications such as ibuprofen or naproxen sodium.  Topical treatments such as oral throat lozenges or sprays may be used as needed.  Avoid close contact with loved ones, especially the very young and elderly.  Remember to wash your hands thoroughly throughout the day as this is the number one way to prevent the spread of infection and wipe down door knobs and counters with disinfectant.  After careful review of your answers, I would not recommend an antibiotic for your condition.  Antibiotics should not be used to treat conditions that we suspect are caused by viruses like the virus that causes the common cold or flu. However, some people can have Strep with atypical symptoms. You may need formal testing in clinic or office to confirm if your symptoms continue or worsen.  Providers prescribe antibiotics to treat infections caused by bacteria. Antibiotics are very powerful in treating bacterial infections when they are used properly.  To maintain their effectiveness, they should be used only when necessary.  Overuse of antibiotics has resulted in the development of super bugs that are resistant to treatment!    Home Care: Only take medications as instructed by your medical team. Do not drink alcohol while taking these medications. A steam or ultrasonic humidifier can help congestion.  You can place a towel over your head and  breathe in the steam from hot water coming from a faucet. Avoid close contacts especially the very young and the elderly. Cover your mouth when you cough or sneeze. Always remember to wash your hands.  Get Help Right Away If: You develop worsening fever or throat pain. You develop a severe head ache or visual changes. Your symptoms persist after you have completed your treatment plan.  Make sure you Understand these instructions. Will watch your condition. Will get help right away if you are not doing well or get worse.   Thank you for choosing an e-visit.  Your e-visit answers were reviewed by a board certified advanced clinical practitioner to complete your personal care plan. Depending upon the condition, your plan could have included both over the counter or prescription medications.  Please review your pharmacy choice. Make sure the pharmacy is open so you can pick up prescription now. If there is a problem, you may contact your provider through MyChart messaging and have the prescription routed to another pharmacy.  Your safety is important to us. If you have drug allergies check your prescription carefully.   For the next 24 hours you can use MyChart to ask questions about today's visit, request a non-urgent call back, or ask for a work or school excuse. You will get an email in the next two days asking about your experience. I hope that your e-visit has been valuable and will speed your recovery.    have provided 5 minutes of non face to face time during this encounter for chart review and documentation.   

## 2023-02-26 ENCOUNTER — Ambulatory Visit: Payer: Medicare HMO

## 2023-02-26 VITALS — Ht 65.0 in | Wt 168.0 lb

## 2023-02-26 DIAGNOSIS — Z0000 Encounter for general adult medical examination without abnormal findings: Secondary | ICD-10-CM | POA: Diagnosis not present

## 2023-02-26 NOTE — Progress Notes (Addendum)
 Subjective:   Penny Gardner is a 68 y.o. female who presents for Medicare Annual (Subsequent) preventive examination.  Visit Complete: Virtual I connected with  Kyani S Eynon on 03/04/23 by a audio enabled telemedicine application and verified that I am speaking with the correct person using two identifiers.  Patient Location: Home  Provider Location: Home Office  I discussed the limitations of evaluation and management by telemedicine. The patient expressed understanding and agreed to proceed.  Vital Signs: Because this visit was a virtual/telehealth visit, some criteria may be missing or patient reported. Any vitals not documented were not able to be obtained and vitals that have been documented are patient reported.  Patient Medicare AWV questionnaire was completed by the patient on 02/22/23; I have confirmed that all information answered by patient is correct and no changes since this date.  Cardiac Risk Factors include: advanced age (>4men, >61 women);dyslipidemia     Objective:    Today's Vitals   02/26/23 0805  Weight: 168 lb (76.2 kg)  Height: 5\' 5"  (1.651 m)   Body mass index is 27.96 kg/m.     02/26/2023    8:19 AM 01/05/2023    6:00 AM 02/21/2022    3:37 PM 07/27/2020    1:53 PM 07/22/2020    1:36 PM 07/07/2020    9:23 AM 07/04/2020    1:29 PM  Advanced Directives  Does Patient Have a Medical Advance Directive? Yes No Yes No No No No  Type of Estate agent of [place];Living will  Living will;Healthcare Power of Attorney      Does patient want to make changes to medical advance directive?   No - Patient declined      Copy of Healthcare Power of Attorney in Chart? No - copy requested  No - copy requested      Would patient like information on creating a medical advance directive?    Yes (MAU/Ambulatory/Procedural Areas - Information given) Yes (MAU/Ambulatory/Procedural Areas - Information given) Yes (MAU/Ambulatory/Procedural Areas -  Information given) Yes (MAU/Ambulatory/Procedural Areas - Information given)    Current Medications (verified) Outpatient Encounter Medications as of 02/26/2023  Medication Sig   acetaminophen (TYLENOL) 500 MG tablet Take 500 mg by mouth as needed.   Adapalene 0.3 % gel Apply topically.   Cholecalciferol 25 MCG (1000 UT) tablet Take 2,000 Units by mouth daily.   loratadine (CLARITIN) 10 MG tablet Take 10 mg by mouth daily as needed for allergies.   metroNIDAZOLE (METROGEL) 0.75 % gel SMARTSIG:sparingly Topical Daily   polyethylene glycol (MIRALAX / GLYCOLAX) 17 g packet Take 17 g by mouth daily as needed.   Probiotic Product (PROBIOTIC ADVANCED) CAPS Take 1 capsule by mouth daily.   clindamycin (CLEOCIN T) 1 % external solution APPLY TO AFFECTED AREA TWICE A DAY   No facility-administered encounter medications on file as of 02/26/2023.    Allergies (verified) Cefaclor   History: Past Medical History:  Diagnosis Date   Benign neoplasm of colon    Conjunctival hemorrhage    Diverticulosis of colon (without mention of hemorrhage)    colonoscopy 8/02, 03/11/07 abnormal   Esophageal reflux    EGD positive 10/06   Hiatal hernia    History of cesarean section    1986 and 1988   History of ethmoidectomy 1989   Impacted cerumen    Papanicolaou smear of cervix with atypical squamous cells of undetermined significance (ASC-US)    colposcopy 3/07, ASCUS   Plantar fascial fibromatosis  Postmenopausal HRT (hormone replacement therapy)    Routine general medical examination at a health care facility    Stricture and stenosis of esophagus    Past Surgical History:  Procedure Laterality Date   CATARACT EXTRACTION  2018   CESAREAN SECTION     1986 and 1988   ETHMOIDECTOMY  1989   GALLBLADDER SURGERY     Family History  Problem Relation Age of Onset   Cancer Mother        colon   Heart attack Mother 8   Suicidality Father    Diabetes Father    Cancer Maternal Uncle        colon    Stroke Maternal Grandmother    Diabetes Paternal Grandmother    Social History   Socioeconomic History   Marital status: Married    Spouse name: Raiford Noble   Number of children: 2   Years of education: Not on file   Highest education level: Master's degree (e.g., MA, MS, MEng, MEd, MSW, MBA)  Occupational History   Occupation: RETIRED  Tobacco Use   Smoking status: Never   Smokeless tobacco: Never  Vaping Use   Vaping status: Never Used  Substance and Sexual Activity   Alcohol use: Yes    Alcohol/week: 5.0 standard drinks of alcohol    Types: 5 Standard drinks or equivalent per week   Drug use: Never   Sexual activity: Yes  Other Topics Concern   Not on file  Social History Narrative   Diet:      Caffeine: yes      Married, if yes what year: yes.1980      Do you live in a house, apartment, assisted living, condo, trailer, ect: house      Is it one or more stories: none      How many persons live in your home? 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 No   Do you have difficulty preparing food or eating? No   Do you have difficulty managing your medications? No   Do you have difficulty managing your finances? No   Do you have difficulty affording your medications? NO   0   Social Drivers of Corporate investment banker Strain: Low Risk  (02/26/2023)   Overall Financial Resource Strain (CARDIA)    Difficulty of Paying Living Expenses: Not hard at all  Food Insecurity: No Food Insecurity (02/26/2023)   Hunger Vital Sign    Worried About Running Out of Food in the Last Year: Never true    Ran Out of Food in the Last  Year: Never true  Transportation Needs: No Transportation Needs (02/26/2023)   PRAPARE - Administrator, Civil Service (Medical): No    Lack of Transportation (Non-Medical): No  Physical Activity: Sufficiently Active (02/26/2023)   Exercise Vital Sign    Days of Exercise per Week: 7 days    Minutes of  Exercise per Session: 60 min  Recent Concern: Physical Activity - Inactive (01/07/2023)   Exercise Vital Sign    Days of Exercise per Week: 0 days    Minutes of Exercise per Session: 20 min  Stress: No Stress Concern Present (02/26/2023)   [employer] of Occupational Health - Occupational Stress Questionnaire    Feeling of Stress : Only a little  Social Connections: Moderately Isolated (02/26/2023)   Social Connection and Isolation Panel [NHANES]    Frequency of Communication with Friends and Family: More than three times a week    Frequency of Social Gatherings with Friends and Family: Three times a week    Attends Religious Services: Never    Active Member of Clubs or Organizations: No    Attends Banker Meetings: Never    Marital Status: Married    Tobacco Counseling Counseling given: Not Answered   Clinical Intake:  Pre-visit preparation completed: Yes  Pain : No/denies pain              Information entered by :: Azayla Polo, RMA   Activities of Daily Living    02/22/2023    9:43 AM  In your present state of health, do you have any difficulty performing the following activities:  Hearing? 0  Vision? 0  Difficulty concentrating or making decisions? 0  Walking or climbing stairs? 0  Dressing or bathing? 0  Doing errands, shopping? 0  Preparing Food and eating ? N  Using the Toilet? N  In the past six months, have you accidently leaked urine? N  Do you have problems with loss of bowel control? N  Managing your Medications? N  Managing your Finances? N  Housekeeping or managing your Housekeeping? N    Patient Care Team: Elenore Paddy, NP as PCP - General (Nurse Practitioner) Beryle Flock, PT as Physical Therapist (Physical Therapy) Clyda Hurdle (Physician Assistant) Tyrell Antonio, MD as Consulting Physician (Physical Medicine and Rehabilitation)  Indicate any recent Medical Services you may have received from other than [facility] providers in the past year (date may be approximate).     Assessment:   This is a routine wellness examination for Shantella.  Hearing/Vision screen Hearing Screening - Comments:: Denies hearing difficulties   Vision Screening - Comments:: Wears eyeglasses   Goals Addressed             This Visit's Progress    Remain Healthy and Active   On track     Depression Screen    02/26/2023    4:31 PM 01/10/2023    8:15 AM 06/29/2022    4:15 PM 03/15/2022    3:31 PM 02/21/2022    3:36 PM 08/17/2021    8:12 AM 08/03/2020   10:20 AM  PHQ 2/9 Scores  PHQ - 2 Score 0 0 0 0 0 0 0  PHQ- 9 Score 0   0       Fall Risk    02/22/2023    9:43 AM 01/10/2023    8:14 AM 06/29/2022    4:15 PM 03/15/2022    3:31 PM 02/21/2022    3:34 PM  Fall Risk   Falls in the past year? 1 0 0 0 0  Number falls in past yr: 0 0 0 0   Injury with Fall? 0 0 0 0 0  Risk for fall due to : No Fall Risks No Fall Risks No Fall Risks No Fall Risks   Follow up Falls evaluation  completed;Falls prevention discussed Falls evaluation completed Falls evaluation completed Falls evaluation completed Falls prevention discussed;Education provided;Falls evaluation completed    MEDICARE RISK AT HOME: Medicare Risk at Home Any stairs in or around the home?: (Patient-Rptd) Yes If so, are there any without handrails?: (Patient-Rptd) No Home free of loose throw rugs in walkways, pet beds, electrical cords, etc?: (Patient-Rptd) Yes Adequate lighting in your home to reduce risk of falls?: (Patient-Rptd) Yes Life alert?: (Patient-Rptd) No Use of a cane, walker or w/c?: (Patient-Rptd) No Grab bars in the bathroom?:  (Patient-Rptd) Yes Shower chair or bench in shower?: (Patient-Rptd) Yes Elevated toilet seat or a handicapped toilet?: (Patient-Rptd) No  TIMED UP AND GO:  Was the test performed?  No    Cognitive Function:        02/26/2023    8:07 AM 02/21/2022    3:39 PM 07/07/2020    9:24 AM  6CIT Screen  What Year? 0 points 0 points 0 points  What month? 0 points 0 points 0 points  What time? 0 points 0 points 0 points  Count back from 20 0 points 0 points 0 points  Months in reverse 0 points 0 points 0 points  Repeat phrase 0 points 0 points 0 points  Total Score 0 points 0 points 0 points    Immunizations Immunization History  Administered Date(s) Administered   Fluad Quad(high Dose 65+) 10/09/2021   Influenza, Seasonal, Injecte, Preservative Fre 10/12/2015, 10/16/2017   Influenza,inj,Quad PF,6+ Mos 10/12/2014, 09/05/2016, 09/10/2018   Influenza-Unspecified 11/17/2020, 10/09/2021   Moderna Sars-Covid-2 Vaccination 02/19/2019, 03/20/2019, 12/04/2019   PNEUMOCOCCAL CONJUGATE-20 03/15/2022   Pneumococcal Conjugate-13 07/04/2020   Tdap 08/02/2011    TDAP status: Up to date  Flu Vaccine status: Up to date  Pneumococcal vaccine status: Up to date  Covid-19 vaccine status: Completed vaccines  Qualifies for Shingles Vaccine? Yes   Zostavax completed No   Shingrix Completed?: No.    Education has been provided regarding the importance of this vaccine. Patient has been advised to call insurance company to determine out of pocket expense if they have not yet received this vaccine. Advised may also receive vaccine at local pharmacy or Health Dept. Verbalized acceptance and understanding.  Screening Tests Health Maintenance  Topic Date Due   Zoster Vaccines- Shingrix (1 of 2) Never done   DTaP/Tdap/Td (2 - Td or Tdap) 08/01/2021   COVID-19 Vaccine (4 - 2024-25 season) 09/09/2022   MAMMOGRAM  12/29/2022   Medicare Annual Wellness (AWV)  02/26/2024   Colonoscopy  02/07/2026    Pneumonia Vaccine 45+ Years old  Completed   INFLUENZA VACCINE  Completed   DEXA SCAN  Completed   Hepatitis C Screening  Completed   HPV VACCINES  Aged Out    Health Maintenance  Health Maintenance Due  Topic Date Due   Zoster Vaccines- Shingrix (1 of 2) Never done   DTaP/Tdap/Td (2 - Td or Tdap) 08/01/2021   COVID-19 Vaccine (4 - 2024-25 season) 09/09/2022   MAMMOGRAM  12/29/2022    Colorectal cancer screening: Type of screening: Colonoscopy. Completed 02/07/2021. Repeat every 5 years  Mammogram status: Completed 01/18/2021. Repeat every year  Bone Density status: Completed 12/28/2020. Results reflect: Bone density results: OSTEOPENIA. Repeat every 2 years.  Lung Cancer Screening: (Low Dose CT Chest recommended if Age 89-80 years, 20 pack-year currently smoking OR have quit w/in 15years.) does not qualify.   Lung Cancer Screening Referral: N/A  Additional Screening:  Hepatitis C Screening: does qualify; Completed 04/08/2017  Vision  Screening: Recommended annual ophthalmology exams for early detection of glaucoma and other disorders of the eye. Is the patient up to date with their annual eye exam?  Yes -per pt Who is the provider or what is the name of the office in which the patient attends annual eye exams? N/A If pt is not established with a provider, would they like to be referred to a provider to establish care? No .   Dental Screening: Recommended annual dental exams for proper oral hygiene   Community Resource Referral / Chronic Care Management: CRR required this visit?  No   CCM required this visit?  No     Plan:     I have personally reviewed and noted the following in the patient's chart:   Medical and social history Use of alcohol, tobacco or illicit drugs  Current medications and supplements including opioid prescriptions. Patient is not currently taking opioid prescriptions. Functional ability and status Nutritional status Physical  activity Advanced directives List of other physicians Hospitalizations, surgeries, and ER visits in previous 12 months Vitals Screenings to include cognitive, depression, and falls Referrals and appointments  In addition, I have reviewed and discussed with patient certain preventive protocols, quality metrics, and best practice recommendations. A written personalized care plan for preventive services as well as general preventive health recommendations were provided to patient.     Victoria Euceda L Aryiah Monterosso, CMA   03/04/2023   After Visit Summary: (MyChart) Due to this being a telephonic visit, the after visit summary with patients personalized plan was offered to patient via MyChart   Nurse Notes: Patient is due for a Shingrix vaccine.  Patient is due for a mammogram this year.  Patient requested no to order today.  Patient would like to discuss during her next office visit.  She was in a hurry with visit today due to her getting pulled from a ditch.  Patient had no other concerns to address today.

## 2023-03-01 NOTE — Patient Instructions (Addendum)
Penny Gardner , Thank you for taking time to come for your Medicare Wellness Visit. I appreciate your ongoing commitment to your health goals. Please review the following plan we discussed and let me know if I can assist you in the future.   Referrals/Orders/Follow-Ups/Clinician Recommendations: It was nice to talk with today.  You are due for a Shingles vaccine.  You are also due for a mammogram this year.  Please follow up with [name] during your next office visit with her.    This is a list of the screening recommended for you and due dates:  Health Maintenance  Topic Date Due   Zoster (Shingles) Vaccine (1 of 2) Never done   DTaP/Tdap/Td vaccine (2 - Td or Tdap) 08/01/2021   COVID-19 Vaccine (4 - 2024-25 season) 09/09/2022   Mammogram  12/29/2022   Medicare Annual Wellness Visit  02/26/2024   Colon Cancer Screening  02/07/2026   Pneumonia Vaccine  Completed   Flu Shot  Completed   DEXA scan (bone density measurement)  Completed   Hepatitis C Screening  Completed   HPV Vaccine  Aged Out    Advanced directives: (Copy Requested) Please bring a copy of your health care power of attorney and living will to the office to be added to your chart at your convenience.  Next Medicare Annual Wellness Visit scheduled for next year: Yes

## 2023-07-08 DIAGNOSIS — M9905 Segmental and somatic dysfunction of pelvic region: Secondary | ICD-10-CM | POA: Diagnosis not present

## 2023-07-08 DIAGNOSIS — M5136 Other intervertebral disc degeneration, lumbar region with discogenic back pain only: Secondary | ICD-10-CM | POA: Diagnosis not present

## 2023-07-08 DIAGNOSIS — M9904 Segmental and somatic dysfunction of sacral region: Secondary | ICD-10-CM | POA: Diagnosis not present

## 2023-07-08 DIAGNOSIS — M9903 Segmental and somatic dysfunction of lumbar region: Secondary | ICD-10-CM | POA: Diagnosis not present

## 2023-07-11 DIAGNOSIS — M9904 Segmental and somatic dysfunction of sacral region: Secondary | ICD-10-CM | POA: Diagnosis not present

## 2023-07-11 DIAGNOSIS — M5136 Other intervertebral disc degeneration, lumbar region with discogenic back pain only: Secondary | ICD-10-CM | POA: Diagnosis not present

## 2023-07-11 DIAGNOSIS — M9905 Segmental and somatic dysfunction of pelvic region: Secondary | ICD-10-CM | POA: Diagnosis not present

## 2023-07-11 DIAGNOSIS — M9903 Segmental and somatic dysfunction of lumbar region: Secondary | ICD-10-CM | POA: Diagnosis not present

## 2023-07-18 DIAGNOSIS — M9905 Segmental and somatic dysfunction of pelvic region: Secondary | ICD-10-CM | POA: Diagnosis not present

## 2023-07-18 DIAGNOSIS — M9903 Segmental and somatic dysfunction of lumbar region: Secondary | ICD-10-CM | POA: Diagnosis not present

## 2023-07-18 DIAGNOSIS — M5136 Other intervertebral disc degeneration, lumbar region with discogenic back pain only: Secondary | ICD-10-CM | POA: Diagnosis not present

## 2023-07-18 DIAGNOSIS — M9904 Segmental and somatic dysfunction of sacral region: Secondary | ICD-10-CM | POA: Diagnosis not present

## 2023-10-10 ENCOUNTER — Other Ambulatory Visit: Payer: Self-pay | Admitting: Nurse Practitioner

## 2023-10-10 DIAGNOSIS — Z1231 Encounter for screening mammogram for malignant neoplasm of breast: Secondary | ICD-10-CM

## 2023-10-22 ENCOUNTER — Other Ambulatory Visit: Payer: Self-pay | Admitting: Medical Genetics

## 2023-10-29 ENCOUNTER — Ambulatory Visit

## 2023-10-30 ENCOUNTER — Encounter: Payer: Self-pay | Admitting: Nurse Practitioner

## 2023-10-31 DIAGNOSIS — H524 Presbyopia: Secondary | ICD-10-CM | POA: Diagnosis not present

## 2023-11-01 ENCOUNTER — Other Ambulatory Visit: Payer: Self-pay | Admitting: Nurse Practitioner

## 2023-11-01 ENCOUNTER — Ambulatory Visit: Admitting: Nurse Practitioner

## 2023-11-01 ENCOUNTER — Telehealth: Payer: Self-pay | Admitting: Nurse Practitioner

## 2023-11-01 DIAGNOSIS — E785 Hyperlipidemia, unspecified: Secondary | ICD-10-CM

## 2023-11-01 DIAGNOSIS — K5792 Diverticulitis of intestine, part unspecified, without perforation or abscess without bleeding: Secondary | ICD-10-CM

## 2023-11-01 DIAGNOSIS — E663 Overweight: Secondary | ICD-10-CM

## 2023-11-01 DIAGNOSIS — E559 Vitamin D deficiency, unspecified: Secondary | ICD-10-CM

## 2023-11-01 MED ORDER — AMOXICILLIN-POT CLAVULANATE 875-125 MG PO TABS: 1.0000 | ORAL_TABLET | Freq: Two times a day (BID) | ORAL | 0 refills | Status: AC

## 2023-11-01 NOTE — Telephone Encounter (Signed)
 Patient sent mychart message regarding flare-up of her diverticulitis. She is requesting prescription of augmentin . She has long-standing history of diverticulitis flares. I am agreeable to sending course of augmentin . Discussed strict return precautions. Patient reports understanding. Come to office at follow-up as scheduled.

## 2023-11-13 ENCOUNTER — Ambulatory Visit
Admission: RE | Admit: 2023-11-13 | Discharge: 2023-11-13 | Disposition: A | Source: Ambulatory Visit | Attending: Nurse Practitioner | Admitting: Nurse Practitioner

## 2023-11-13 DIAGNOSIS — Z1231 Encounter for screening mammogram for malignant neoplasm of breast: Secondary | ICD-10-CM | POA: Diagnosis not present

## 2023-11-18 ENCOUNTER — Ambulatory Visit: Payer: Self-pay | Admitting: Nurse Practitioner

## 2023-11-26 ENCOUNTER — Other Ambulatory Visit

## 2023-11-26 DIAGNOSIS — Z006 Encounter for examination for normal comparison and control in clinical research program: Secondary | ICD-10-CM

## 2023-12-04 DIAGNOSIS — D1801 Hemangioma of skin and subcutaneous tissue: Secondary | ICD-10-CM | POA: Diagnosis not present

## 2023-12-04 DIAGNOSIS — D225 Melanocytic nevi of trunk: Secondary | ICD-10-CM | POA: Diagnosis not present

## 2023-12-04 DIAGNOSIS — L738 Other specified follicular disorders: Secondary | ICD-10-CM | POA: Diagnosis not present

## 2023-12-04 DIAGNOSIS — L299 Pruritus, unspecified: Secondary | ICD-10-CM | POA: Diagnosis not present

## 2023-12-04 DIAGNOSIS — L821 Other seborrheic keratosis: Secondary | ICD-10-CM | POA: Diagnosis not present

## 2023-12-04 DIAGNOSIS — L719 Rosacea, unspecified: Secondary | ICD-10-CM | POA: Diagnosis not present

## 2023-12-04 DIAGNOSIS — L503 Dermatographic urticaria: Secondary | ICD-10-CM | POA: Diagnosis not present

## 2023-12-04 DIAGNOSIS — L814 Other melanin hyperpigmentation: Secondary | ICD-10-CM | POA: Diagnosis not present

## 2023-12-04 DIAGNOSIS — D485 Neoplasm of uncertain behavior of skin: Secondary | ICD-10-CM | POA: Diagnosis not present

## 2023-12-09 DIAGNOSIS — Z961 Presence of intraocular lens: Secondary | ICD-10-CM | POA: Diagnosis not present

## 2023-12-09 DIAGNOSIS — H04202 Unspecified epiphora, left lacrimal gland: Secondary | ICD-10-CM | POA: Diagnosis not present

## 2023-12-09 DIAGNOSIS — H26491 Other secondary cataract, right eye: Secondary | ICD-10-CM | POA: Diagnosis not present

## 2023-12-09 LAB — GENECONNECT MOLECULAR SCREEN: Genetic Analysis Overall Interpretation: NEGATIVE

## 2024-02-25 ENCOUNTER — Ambulatory Visit: Payer: Medicare HMO

## 2024-02-27 ENCOUNTER — Ambulatory Visit: Payer: Medicare HMO

## 2024-02-28 ENCOUNTER — Ambulatory Visit

## 2024-03-02 ENCOUNTER — Ambulatory Visit: Payer: Medicare HMO

## 2024-03-05 ENCOUNTER — Ambulatory Visit: Admitting: Nurse Practitioner
